# Patient Record
Sex: Male | Born: 1950
Health system: Southern US, Community
[De-identification: ages and names within clinical notes are randomized; demographics above are authoritative.]

## PROBLEM LIST (undated history)

## (undated) DIAGNOSIS — E785 Hyperlipidemia, unspecified: Secondary | ICD-10-CM

## (undated) DIAGNOSIS — Z95810 Presence of automatic (implantable) cardiac defibrillator: Secondary | ICD-10-CM

## (undated) DIAGNOSIS — I255 Ischemic cardiomyopathy: Secondary | ICD-10-CM

## (undated) DIAGNOSIS — I519 Heart disease, unspecified: Secondary | ICD-10-CM

## (undated) DIAGNOSIS — I2510 Atherosclerotic heart disease of native coronary artery without angina pectoris: Secondary | ICD-10-CM

## (undated) DIAGNOSIS — Z8614 Personal history of Methicillin resistant Staphylococcus aureus infection: Secondary | ICD-10-CM

## (undated) DIAGNOSIS — N529 Male erectile dysfunction, unspecified: Secondary | ICD-10-CM

## (undated) DIAGNOSIS — Z720 Tobacco use: Secondary | ICD-10-CM

## (undated) DIAGNOSIS — I252 Old myocardial infarction: Secondary | ICD-10-CM

## (undated) HISTORY — DX: Tobacco use: Z72.0

## (undated) HISTORY — DX: Male erectile dysfunction, unspecified: N52.9

## (undated) HISTORY — DX: Old myocardial infarction: I25.2

## (undated) HISTORY — DX: Ischemic cardiomyopathy: I25.5

## (undated) HISTORY — DX: Personal history of Methicillin resistant Staphylococcus aureus infection: Z86.14

## (undated) HISTORY — DX: Heart disease, unspecified: I51.9

## (undated) HISTORY — DX: Hyperlipidemia, unspecified: E78.5

## (undated) HISTORY — DX: Atherosclerotic heart disease of native coronary artery without angina pectoris: I25.10

## (undated) HISTORY — DX: Presence of automatic (implantable) cardiac defibrillator: Z95.810

---

## 2002-05-22 HISTORY — PX: CARDIAC CATHETERIZATION: SHX172

## 2005-10-21 ENCOUNTER — Emergency Department (HOSPITAL_COMMUNITY): Admission: EM | Admit: 2005-10-21 | Discharge: 2005-10-21 | Payer: Self-pay | Admitting: Emergency Medicine

## 2009-07-14 ENCOUNTER — Encounter: Admission: RE | Admit: 2009-07-14 | Discharge: 2009-07-14 | Payer: Self-pay | Admitting: Cardiology

## 2010-07-07 ENCOUNTER — Inpatient Hospital Stay (HOSPITAL_COMMUNITY)
Admission: EM | Admit: 2010-07-07 | Discharge: 2010-07-17 | DRG: 853 | Disposition: A | Discharge status: Home Health | Payer: BC Managed Care – PPO | Source: Ambulatory Visit | Attending: Cardiology | Admitting: Cardiology

## 2010-07-07 ENCOUNTER — Inpatient Hospital Stay (HOSPITAL_COMMUNITY): Payer: BC Managed Care – PPO

## 2010-07-07 DIAGNOSIS — I2510 Atherosclerotic heart disease of native coronary artery without angina pectoris: Secondary | ICD-10-CM | POA: Diagnosis present

## 2010-07-07 DIAGNOSIS — I9589 Other hypotension: Secondary | ICD-10-CM | POA: Diagnosis not present

## 2010-07-07 DIAGNOSIS — I10 Essential (primary) hypertension: Secondary | ICD-10-CM | POA: Diagnosis present

## 2010-07-07 DIAGNOSIS — Z9861 Coronary angioplasty status: Secondary | ICD-10-CM

## 2010-07-07 DIAGNOSIS — IMO0002 Reserved for concepts with insufficient information to code with codable children: Secondary | ICD-10-CM | POA: Diagnosis not present

## 2010-07-07 DIAGNOSIS — Y9210 Unspecified residential institution as the place of occurrence of the external cause: Secondary | ICD-10-CM | POA: Diagnosis not present

## 2010-07-07 DIAGNOSIS — I2109 ST elevation (STEMI) myocardial infarction involving other coronary artery of anterior wall: Secondary | ICD-10-CM

## 2010-07-07 DIAGNOSIS — Y849 Medical procedure, unspecified as the cause of abnormal reaction of the patient, or of later complication, without mention of misadventure at the time of the procedure: Secondary | ICD-10-CM | POA: Diagnosis not present

## 2010-07-07 DIAGNOSIS — I252 Old myocardial infarction: Secondary | ICD-10-CM

## 2010-07-07 DIAGNOSIS — Z7982 Long term (current) use of aspirin: Secondary | ICD-10-CM

## 2010-07-07 DIAGNOSIS — F17200 Nicotine dependence, unspecified, uncomplicated: Secondary | ICD-10-CM | POA: Diagnosis present

## 2010-07-07 DIAGNOSIS — A4902 Methicillin resistant Staphylococcus aureus infection, unspecified site: Secondary | ICD-10-CM | POA: Diagnosis not present

## 2010-07-07 DIAGNOSIS — I059 Rheumatic mitral valve disease, unspecified: Secondary | ICD-10-CM

## 2010-07-07 DIAGNOSIS — I519 Heart disease, unspecified: Secondary | ICD-10-CM

## 2010-07-07 DIAGNOSIS — E785 Hyperlipidemia, unspecified: Secondary | ICD-10-CM | POA: Diagnosis present

## 2010-07-07 HISTORY — DX: Heart disease, unspecified: I51.9

## 2010-07-07 HISTORY — PX: CARDIAC CATHETERIZATION: SHX172

## 2010-07-07 LAB — POCT I-STAT, CHEM 8
BUN: 18 mg/dL (ref 6–23)
Chloride: 100 mEq/L (ref 96–112)
Creatinine, Ser: 0.7 mg/dL (ref 0.4–1.5)
Potassium: 3.2 mEq/L — ABNORMAL LOW (ref 3.5–5.1)
Sodium: 131 mEq/L — ABNORMAL LOW (ref 135–145)

## 2010-07-07 LAB — CBC
MCH: 29.8 pg (ref 26.0–34.0)
MCHC: 35 g/dL (ref 30.0–36.0)
MCV: 85.1 fL (ref 78.0–100.0)
Platelets: 158 10*3/uL (ref 150–400)

## 2010-07-07 LAB — DIFFERENTIAL
Eosinophils Absolute: 0.1 10*3/uL (ref 0.0–0.7)
Eosinophils Relative: 1 % (ref 0–5)
Lymphocytes Relative: 12 % (ref 12–46)
Lymphs Abs: 1.1 10*3/uL (ref 0.7–4.0)
Monocytes Absolute: 0.4 10*3/uL (ref 0.1–1.0)
Monocytes Relative: 4 % (ref 3–12)
Neutro Abs: 7.6 10*3/uL (ref 1.7–7.7)

## 2010-07-07 LAB — COMPREHENSIVE METABOLIC PANEL
AST: 137 U/L — ABNORMAL HIGH (ref 0–37)
Albumin: 3.6 g/dL (ref 3.5–5.2)
Alkaline Phosphatase: 58 U/L (ref 39–117)
Calcium: 8.6 mg/dL (ref 8.4–10.5)
Creatinine, Ser: 0.96 mg/dL (ref 0.4–1.5)
GFR calc Af Amer: >60 mL/min (ref >60)
GFR calc non Af Amer: >60 mL/min (ref >60)
Total Bilirubin: 0.7 mg/dL (ref 0.3–1.2)
Total Protein: 6.4 g/dL (ref 6.0–8.3)

## 2010-07-07 LAB — POCT ACTIVATED CLOTTING TIME: Activated Clotting Time: 429 seconds

## 2010-07-07 LAB — TSH: TSH: 0.867 u[IU]/mL (ref 0.350–4.500)

## 2010-07-07 LAB — CARDIAC PANEL(CRET KIN+CKTOT+MB+TROPI)
CK, MB: 360.2 ng/mL (ref 0.3–4.0)
Relative Index: 12 — ABNORMAL HIGH (ref 0.0–2.5)
Total CK: 2813 U/L — ABNORMAL HIGH (ref 7–232)

## 2010-07-07 LAB — HEMOGLOBIN A1C
Hgb A1c MFr Bld: 5.8 % — ABNORMAL HIGH (ref <5.7)
Mean Plasma Glucose: 120 mg/dL — ABNORMAL HIGH (ref <117)

## 2010-07-07 LAB — BRAIN NATRIURETIC PEPTIDE: Pro B Natriuretic peptide (BNP): <30 pg/mL (ref 0.0–100.0)

## 2010-07-07 LAB — MAGNESIUM: Magnesium: 1.8 mg/dL (ref 1.5–2.5)

## 2010-07-08 DIAGNOSIS — I2109 ST elevation (STEMI) myocardial infarction involving other coronary artery of anterior wall: Secondary | ICD-10-CM

## 2010-07-08 LAB — LIPID PANEL
LDL Cholesterol: 103 mg/dL — ABNORMAL HIGH (ref 0–99)
Total CHOL/HDL Ratio: 4.9 RATIO
Triglycerides: 69 mg/dL (ref <150)
VLDL: 14 mg/dL (ref 0–40)

## 2010-07-08 LAB — BASIC METABOLIC PANEL
Calcium: 8.5 mg/dL (ref 8.4–10.5)
Chloride: 104 mEq/L (ref 96–112)

## 2010-07-08 LAB — CBC
HCT: 43 % (ref 39.0–52.0)
Hemoglobin: 14.7 g/dL (ref 13.0–17.0)
MCH: 29.2 pg (ref 26.0–34.0)
Platelets: 133 10*3/uL — ABNORMAL LOW (ref 150–400)
RDW: 13.3 % (ref 11.5–15.5)

## 2010-07-08 LAB — CARDIAC PANEL(CRET KIN+CKTOT+MB+TROPI)
Relative Index: 11.8 — ABNORMAL HIGH (ref 0.0–2.5)
Troponin I: 67.89 ng/mL (ref 0.00–0.06)

## 2010-07-09 LAB — CBC
HCT: 42.1 % (ref 39.0–52.0)
Hemoglobin: 14.5 g/dL (ref 13.0–17.0)
MCH: 29.3 pg (ref 26.0–34.0)
MCHC: 34.4 g/dL (ref 30.0–36.0)
MCV: 85.1 fL (ref 78.0–100.0)
RBC: 4.95 MIL/uL (ref 4.22–5.81)
RDW: 13.2 % (ref 11.5–15.5)
WBC: 9.7 10*3/uL (ref 4.0–10.5)

## 2010-07-09 LAB — BASIC METABOLIC PANEL
Calcium: 8.3 mg/dL — ABNORMAL LOW (ref 8.4–10.5)
Chloride: 104 mEq/L (ref 96–112)
GFR calc non Af Amer: >60 mL/min (ref >60)
Glucose, Bld: 111 mg/dL — ABNORMAL HIGH (ref 70–99)
Potassium: 4 mEq/L (ref 3.5–5.1)

## 2010-07-10 ENCOUNTER — Inpatient Hospital Stay (HOSPITAL_COMMUNITY): Payer: BC Managed Care – PPO

## 2010-07-11 LAB — CBC
HCT: 41.9 % (ref 39.0–52.0)
MCHC: 33.7 g/dL (ref 30.0–36.0)
Platelets: 132 10*3/uL — ABNORMAL LOW (ref 150–400)
RDW: 13.3 % (ref 11.5–15.5)
WBC: 7.4 10*3/uL (ref 4.0–10.5)

## 2010-07-11 LAB — URINE CULTURE
Colony Count: NO GROWTH
Culture  Setup Time: 201202190152

## 2010-07-11 LAB — BASIC METABOLIC PANEL
Calcium: 8.2 mg/dL — ABNORMAL LOW (ref 8.4–10.5)
GFR calc Af Amer: >60 mL/min (ref >60)
GFR calc non Af Amer: >60 mL/min (ref >60)
Glucose, Bld: 104 mg/dL — ABNORMAL HIGH (ref 70–99)
Potassium: 3.8 mEq/L (ref 3.5–5.1)
Sodium: 133 mEq/L — ABNORMAL LOW (ref 135–145)

## 2010-07-11 LAB — BRAIN NATRIURETIC PEPTIDE: Pro B Natriuretic peptide (BNP): 463 pg/mL — ABNORMAL HIGH (ref 0.0–100.0)

## 2010-07-12 LAB — CBC
HCT: 39.6 % (ref 39.0–52.0)
HCT: 40.9 % (ref 39.0–52.0)
Hemoglobin: 13.9 g/dL (ref 13.0–17.0)
MCH: 28.2 pg (ref 26.0–34.0)
MCH: 28.8 pg (ref 26.0–34.0)
MCHC: 34 g/dL (ref 30.0–36.0)
MCV: 82.7 fL (ref 78.0–100.0)
Platelets: 136 10*3/uL — ABNORMAL LOW (ref 150–400)
RDW: 12.9 % (ref 11.5–15.5)
RDW: 13 % (ref 11.5–15.5)

## 2010-07-12 LAB — BASIC METABOLIC PANEL
CO2: 27 mEq/L (ref 19–32)
Calcium: 8.5 mg/dL (ref 8.4–10.5)
Creatinine, Ser: 1.18 mg/dL (ref 0.4–1.5)
GFR calc Af Amer: >60 mL/min (ref >60)
GFR calc non Af Amer: >60 mL/min (ref >60)
Glucose, Bld: 96 mg/dL (ref 70–99)
Sodium: 135 mEq/L (ref 135–145)

## 2010-07-13 ENCOUNTER — Inpatient Hospital Stay (HOSPITAL_COMMUNITY): Payer: BC Managed Care – PPO

## 2010-07-13 LAB — BASIC METABOLIC PANEL
CO2: 26 mEq/L (ref 19–32)
Chloride: 100 mEq/L (ref 96–112)
GFR calc Af Amer: >60 mL/min (ref >60)
Glucose, Bld: 106 mg/dL — ABNORMAL HIGH (ref 70–99)
Potassium: 4.1 mEq/L (ref 3.5–5.1)
Sodium: 137 mEq/L (ref 135–145)

## 2010-07-13 LAB — CBC
HCT: 39.2 % (ref 39.0–52.0)
Hemoglobin: 13.3 g/dL (ref 13.0–17.0)
RBC: 4.7 MIL/uL (ref 4.22–5.81)

## 2010-07-14 LAB — BASIC METABOLIC PANEL
BUN: 18 mg/dL (ref 6–23)
CO2: 24 mEq/L (ref 19–32)
Calcium: 8.3 mg/dL — ABNORMAL LOW (ref 8.4–10.5)
Glucose, Bld: 91 mg/dL (ref 70–99)
Sodium: 135 mEq/L (ref 135–145)

## 2010-07-14 LAB — CBC
HCT: 37.8 % — ABNORMAL LOW (ref 39.0–52.0)
Hemoglobin: 13.1 g/dL (ref 13.0–17.0)
MCH: 29 pg (ref 26.0–34.0)
MCHC: 34.7 g/dL (ref 30.0–36.0)
MCV: 83.6 fL (ref 78.0–100.0)

## 2010-07-15 ENCOUNTER — Other Ambulatory Visit (HOSPITAL_COMMUNITY): Payer: BC Managed Care – PPO

## 2010-07-15 DIAGNOSIS — L0291 Cutaneous abscess, unspecified: Secondary | ICD-10-CM

## 2010-07-15 DIAGNOSIS — T827XXA Infection and inflammatory reaction due to other cardiac and vascular devices, implants and grafts, initial encounter: Secondary | ICD-10-CM

## 2010-07-15 DIAGNOSIS — L0390 Cellulitis, unspecified: Secondary | ICD-10-CM

## 2010-07-15 LAB — CBC
HCT: 38 % — ABNORMAL LOW (ref 39.0–52.0)
MCHC: 34.7 g/dL (ref 30.0–36.0)
MCV: 83.3 fL (ref 78.0–100.0)
RDW: 12.8 % (ref 11.5–15.5)

## 2010-07-15 LAB — BASIC METABOLIC PANEL
BUN: 16 mg/dL (ref 6–23)
Creatinine, Ser: 0.86 mg/dL (ref 0.4–1.5)
GFR calc non Af Amer: >60 mL/min (ref >60)
Glucose, Bld: 98 mg/dL (ref 70–99)
Potassium: 4.3 mEq/L (ref 3.5–5.1)

## 2010-07-15 LAB — WOUND CULTURE

## 2010-07-16 DIAGNOSIS — I808 Phlebitis and thrombophlebitis of other sites: Secondary | ICD-10-CM

## 2010-07-16 DIAGNOSIS — M2420 Disorder of ligament, unspecified site: Secondary | ICD-10-CM

## 2010-07-16 DIAGNOSIS — M629 Disorder of muscle, unspecified: Secondary | ICD-10-CM

## 2010-07-16 LAB — CULTURE, BLOOD (ROUTINE X 2)
Culture  Setup Time: 201202192139
Culture: NO GROWTH

## 2010-07-17 ENCOUNTER — Inpatient Hospital Stay (HOSPITAL_COMMUNITY): Payer: BC Managed Care – PPO

## 2010-07-17 LAB — BASIC METABOLIC PANEL
CO2: 27 mEq/L (ref 19–32)
Calcium: 8.3 mg/dL — ABNORMAL LOW (ref 8.4–10.5)
Creatinine, Ser: 0.97 mg/dL (ref 0.4–1.5)
Glucose, Bld: 105 mg/dL — ABNORMAL HIGH (ref 70–99)

## 2010-07-18 ENCOUNTER — Other Ambulatory Visit: Payer: Self-pay | Admitting: *Deleted

## 2010-07-18 LAB — CULTURE, BLOOD (ROUTINE X 2)
Culture  Setup Time: 201202210518
Culture: NO GROWTH

## 2010-07-18 NOTE — Consult Note (Signed)
NAME:  Spencer Horn, Spencer Horn                  ACCOUNT NO.:  [id number]  MEDICAL RECORD NO.:  [id number]           PATIENT TYPE:  I  LOCATION:  2020                         FACILITY:  [facility]  PHYSICIAN:  Dr. Imogene Burn               DATE OF BIRTH:  Aug 05, 1950  DATE OF CONSULTATION:  07/16/2010 DATE OF DISCHARGE:                                CONSULTATION   CHIEF COMPLAINT:  Cellulitis in the antecubital fossa with superficial thrombosis of left cephalic vein with open wound growing MRSA.  HISTORY OF PRESENT ILLNESS:  Spencer Horn is a 60 year old gentleman with a history of coronary artery disease, who was admitted on July 07, 2010 for acute anterior ST-elevation MI.  He developed cellulitis in the left arm in the antecubital space secondary to venipuncture.  He became febrile with a temperature of greater than 101 degrees Fahrenheit.  He had blood cultures and urine cultures, which were negative.  Culture of this small wound showed few MRSA and he was started on IV vancomycin. The patient states that since he has been on the antibiotics, his cellulitis which is on the anterior aspect of the antecubital fossa from just below the elbow and just above the elbow has much improved, and there is now only slight redness at the area of the wound.  He denies pain in the left upper extremity except for at the site of the wound.  PAST MEDICAL HISTORY: 1. Coronary artery disease, status post stent. 2. Hypertension. 3. Hypercholesterolemia. 4. Positive smoker.  Medications, social history, family history, and review of systems were reviewed and they are unchanged from the admission history and physical.  LAB VALUES:  Blood cultures were negative.  Urine cultures negative. Positive MRSA from the left antecubital wound.  He also had a venous Doppler of the left upper extremity, which showed superficial thrombosis of the left cephalic vein extending proximally 1 inch above the antecubital space and wound.   Otherwise, there is no generalized swelling or cellulitis in the arm.  PHYSICAL EXAM:  GENERAL:  This is a well-developed, well-nourished gentleman, in no acute distress.  He is alert and oriented x3. VITAL SIGNS:  His temperature was 98-99, his blood pressure is 97/64, his sats are 97 on room air. LEFT UPPER EXTREMITY:  Warm and well perfused.  He has positive brachial and radial pulses palpable.  He has no generalized swelling in the hand or arm.  He has approximately 1 cm round area of erythema and a 2-mm open superficial wound in the antecubital fossa at the left cephalic vein, and there is a firm area representing thrombosis in the cephalic vein approximately 3 cm above the wound.  Again, there is no generalized cellulitis, swelling, or drainage from the wound.  ASSESSMENT:  Superficial thrombophlebitis, left cephalic vein with open wound growing methicillin-resistant Staphylococcus aureus.  PLAN:  To continue the IV vancomycin and change to p.o. antibiotics per Medicine Service.  Continue warm compresses to the left arm.  As the cellulitis has been improving with the antibiotics, there is no need for I and  D of this area at this time.     Della Goo, PA-C   ______________________________ Dr. Imogene Burn    RR/MEDQ  D:  07/16/2010  T:  07/16/2010  Job:  [id number]  Electronically Signed by Della Goo PA on 07/18/2010 01:07:51 PM

## 2010-07-20 NOTE — Procedures (Signed)
Spencer Horn, Spencer Horn                  ACCOUNT NO.:  [id number]  MEDICAL RECORD NO.:  [id number]           PATIENT TYPE:  I  LOCATION:  2912                         FACILITY:  [facility]  PHYSICIAN:  Peter M. Swaziland, M.D.  DATE OF BIRTH:  March 30, 1951  DATE OF PROCEDURE:  07/07/2010 DATE OF DISCHARGE:                           CARDIAC CATHETERIZATION   INDICATIONS FOR PROCEDURE:  A 60 year old white male with remote myocardial infarction in 2004 while residing in [place].  He had stenting of the proximal LAD at that time.  It is unknown whether this was a bare-metal stent or drug-eluting stent.  He presents this morning with acute onset of severe substernal chest pain.  He has marked ST elevation diffusely in anterolateral leads up to 5-6 mm.  PROCEDURE:  Left heart catheterization, coronary and left ventricular angiography, intracoronary stenting of the proximal LAD.  ACCESS:  Via the right femoral artery using standard Seldinger technique. EQUIPMENT:  A 6-French 4-cm right and left Judkins catheter, 6-French pigtail catheter, 6-French arterial sheath, 6-French FL-4 guiding, Prowater wire Expressway catheter, 2.5 x 20-mm apex balloon, a 3.0 x 38 mm ION stent, and a 3.25 x 20-mm [place] TREK balloon.  MEDICATIONS:  Local anesthesia 1% Xylocaine, Versed 1 mg IV, fentanyl 50 mcg IV, Angiomax bolus of 0.75 mg/kg followed by continuous infusion of 1.75 mg/kg per hour.  Subsequent ACT was 429.  He received 60 mg of p.o. Effient.  Nitroglycerin 200 mcg intracoronary x2.  CONTRAST:  165 mL of Omnipaque.  HEMODYNAMIC DATA:  Aortic pressure is 94/66 with a mean of 80 mmHg. Left ventricular pressure is 100 with an EDP of 28 mmHg.  ANGIOGRAPHIC DATA:  The left coronary artery arises and distributes normally.  The left main coronary artery is normal.  The left anterior descending artery is occluded proximally.  There is a stent noted in the proximal to mid LAD.  The left circumflex coronary artery  has scattered 30% irregularities throughout the midvessel.  The right coronary artery arises and distributes normally.  It has a 20- 30% focal disease in the proximal to mid vessel.  At this point, we proceeded with emergent intervention of the LAD.  We were able to cross the LAD easily with a Prowater wire.  We performed Expressway catheter extraction with some white thrombus removed.  This resulted in reperfusion.  We then predilated the long segment of disease in the LAD extending from the very proximal vessel through the prior stent.  This was predilated with 2.5 mm balloon up to 6 atmospheres.  We then placed a 3.0 x 38 mm ION stent in the proximal LAD deploying this at 11 atmospheres.  The distal portion of the stent was within the old stent.  This was postdilated with a 3.25-mm Misenheimer TREK balloon to 14 atmospheres.  This resulted in excellent angiographic result with 0% residual stenosis and TIMI grade 3 flow.  There was still sluggish flow down the first diagonal.  The patient had marked improvement in his chest pain ST elevation with reperfusion.  He did experience some reperfusion arrhythmias with accelerated idioventricular rhythms.  Left  ventricular angiography was performed at the end of the procedure. This demonstrates extensive anterolateral and apical akinesia was ejection fraction overall 30%.  The right groin was closed using Angio-Seal device with good hemostasis.  FINAL INTERPRETATION: 1. Single-vessel occlusive atherosclerotic coronary artery disease     involving the proximal left anterior descending. 2. Severe left ventricular dysfunction. 3. Successful stenting of the proximal left anterior descending with a     drug-eluting stent.  PLAN:  We will transfer to the Coronary Intensive Care Unit and continue on aspirin and Effient indefinitely.  He will be treated with aggressive medication including beta-blockers, ACE inhibitors, and statins.           ______________________________ Peter M. Swaziland, M.D.     PMJ/MEDQ  D:  07/07/2010  T:  07/08/2010  Job:  [id number]  cc:   Cassell Clement, M.D.  Electronically Signed by PETER Swaziland M.D. on 07/20/2010 04:51:55 PM

## 2010-07-28 ENCOUNTER — Ambulatory Visit (INDEPENDENT_AMBULATORY_CARE_PROVIDER_SITE_OTHER): Payer: BC Managed Care – PPO | Admitting: Cardiology

## 2010-07-28 DIAGNOSIS — I219 Acute myocardial infarction, unspecified: Secondary | ICD-10-CM

## 2010-07-28 DIAGNOSIS — Z79899 Other long term (current) drug therapy: Secondary | ICD-10-CM

## 2010-07-29 NOTE — H&P (Signed)
NAMESTPEHEN, PETITJEAN                  ACCOUNT NO.:  [id number]  MEDICAL RECORD NO.:  [id number]           PATIENT TYPE:  I  LOCATION:  2912                         FACILITY:  [facility]  PHYSICIAN:  Peter M. Swaziland, M.D.  DATE OF BIRTH:  1950/12/26  DATE OF ADMISSION:  07/07/2010 DATE OF DISCHARGE:                             HISTORY & PHYSICAL   PRIMARY CARE PHYSICIAN:  Windle Guard, MD  PRIMARY CARDIOLOGIST:  Cassell Clement, MD  CHIEF COMPLAINT:  STEMI.  HISTORY OF PRESENT ILLNESS:  [name] is a 60 year old male with a history of coronary artery disease.  He was driving and at about 0:45 a.m. today had onset of substernal chest pain associated with nausea, vomiting x2.  At 9:23, EMS was called.  They gave him aspirin 81 mg x4 as well as sublingual nitroglycerin x3 and morphine totaling 8 mg.  His EKG was abnormal indicating an anterior ST-elevation MI.  His chest pain was a 10/10 and crushing.  It was associated with shortness of breath and diaphoresis.  He had some general malaise last p.m. but no specific complaints of chest pain until the sudden onset today while driving. His symptoms are similar to the symptoms he had during his previous MI x2 but no further details are available.  He is being taken emergently to the cath lab.  PAST MEDICAL HISTORY: 1. Status post MI with stent, 2004, no further details available. 2. Status post Myoview in February 2011 showing no scar or ischemia,     EF 66%. 3. Hypertension. 4. Hyperlipidemia. 5. Ongoing tobacco use.  PAST SURGICAL HISTORY:  Cath.  ALLERGIES:  No known drug allergies  CURRENT MEDICATIONS: 1. Aspirin 81 mg a day. 2. Toprol-XL 100 mg a day. 3. Lipitor 80 mg one-half tab daily. 4. The patient is out of Plavix.  SOCIAL HISTORY:  He lives in [place] with his wife.  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  He has a greater than 30-pack-year history of ongoing tobacco use but has cut back.  There is no  known history of alcohol or drug abuse.  FAMILY HISTORY:  Not obtainable secondary to the emergent situation.  REVIEW OF SYSTEMS:  Essentially negative except as stated in the HPI.  PHYSICAL EXAMINATION:  GENERAL:  He is a well-developed, slender white male, in acute distress. HEENT:  Normal. NECK:  There is no lymphadenopathy, thyromegaly, bruit, or JVD noted. CARDIOVASCULAR:  His heart is regular in rate and rhythm with an S1 and S2 and no significant murmur, rub, or gallop is noted.  Distal pulses are intact in all four extremities. LUNGS:  Clear to auscultation bilaterally. SKIN:  No rashes or lesions are noted. ABDOMEN:  Soft and nontender with active bowel sounds. EXTREMITIES:  There is no cyanosis, clubbing, or edema noted.  No femoral bruits are appreciated. MUSCULOSKELETAL:  There is no joint deformity or effusions. NEUROLOGIC:  He is alert and oriented.  Cranial nerves II through XII grossly intact.  EKG is sinus rhythm, rate 78 with acute anterior ST-elevation of greater than 2 mm. Laboratory values and chest x-Arpin are  pending.  IMPRESSION:  Acute anterior ST-elevation myocardial infarction:  Dr. Swaziland is taking the patient emergently to the cath lab with further evaluation and treatment depending on the results.  He will be seen by cardiac rehab and smoking cessation, and we will screen for cardiac risk factors.     Spencer Demark, PA-C   ______________________________ Peter M. Swaziland, M.D.    RB/MEDQ  D:  07/07/2010  T:  07/07/2010  Job:  [id number]  Electronically Signed by Spencer Demark PA-C on 07/25/2010 06:58:31 AM Electronically Signed by PETER Swaziland M.D. on 07/29/2010 11:16:29 AM

## 2010-08-10 NOTE — Discharge Summary (Signed)
Spencer Horn, Spencer Horn                  ACCOUNT NO.:  [id number]  MEDICAL RECORD NO.:  [id number]           PATIENT TYPE:  I  LOCATION:  2020                         FACILITY:  [facility]  PHYSICIAN:  Gerrit Friends. Dietrich Pates, MD, FACCDATE OF BIRTH:  Dec 14, 1950  DATE OF ADMISSION:  07/07/2010 DATE OF DISCHARGE:  07/17/2010                              DISCHARGE SUMMARY   PRIMARY CARDIOLOGIST:  Cassell Clement, MD  PRIMARY CARE PROVIDER:  Windle Guard, MD  DISCHARGE DIAGNOSIS:  Acute anterolateral ST-segment elevation myocardial infarction.  SECONDARY DIAGNOSES: 1. Coronary disease status post prior left anterior descending     stenting with proximal left anterior descending stenting on this     admission. 2. Ischemic cardiomyopathy with an EF of 30-35% by echo this     admission. 3. Hypotension limiting ability to titrate beta-blocker and ACE     inhibitor this admission. 4. Hyperlipidemia. 5. Ongoing tobacco abuse. 6. Methicillin-resistant Staphylococcus aureus septic thrombophlebitis     to be discharged on IV vancomycin. 7. Left cephalic vein, superficial thrombosis without deep vein     thrombosis.  ALLERGIES:  No known drug allergies.  PROCEDURES: 1. Emergent left heart cardiac catheterization revealing total     occlusion of the proximal LAD with otherwise nonobstructive     disease.  The LAD was successfully stented with placement of a 3.0     x 38 mm ion drug-eluting stent. 2. A 2-D echocardiogram performed on July 07, 2010, showing an EF     30-35% with akinesis of the entire apical myocardium as well as the     mid-to-distal anteroseptal myocardium.  Grade 1 diastolic     dysfunction.  Trivial AI.  Mild mitral regurgitation.  PASP 44     mmHg. 3. Left upper extremity ultrasound showing superficial thrombosis of     the left cephalic vein extending approximately 1 inch above the     antecubital space.  No DVT noted.  HISTORY OF PRESENT ILLNESS:  A 60 year old male  with prior history of CAD status post prior LAD stenting in 2004.  He was in his usual state of health until approximately 9 a.m. on July 07, 2010, he began sudden onset of chest pain associated with nausea and vomiting x2.  EMS was called and the patient was treated with baby aspirin, sublingual nitroglycerin, as well as morphine.  ECG showed anterolateral ST-segment elevation.  Code STEMI was called and the patient was taken emergently to [facility].  HOSPITAL COURSE:  The patient underwent emergent diagnostic catheterization revealing total occlusion of proximal LAD with otherwise nonobstructive disease.  The LAD was successfully intervened upon with placement of 3.0 x 38 mm ion drug-eluting stent.  Left ventriculography was performed showing an EF of 30% with anterolateral and apical akinesis.  Postprocedure, the patient was monitored in the Coronary Intensive Care Unit.  He was maintained on aspirin, Effient, statin, and beta-blocker therapy with reduction in EF noted on ventriculography.  A 2-D echocardiogram was performed on July 07, 2010, showing an EF of 30- 35% with anteroseptal and apical akinesis.  Unfortunately, the patient  was initially hypotensive requiring downward titration of his beta- blocker and preventing initiation of ACE inhibitor therapy.  His blood pressures, although remaining soft, has improved some with a general trend in the low 100s allowing for initiation of low-dose ACE inhibitor along with low-dose spirolactone therapy.  The patient has had no issues with volume overload during this admission.  He has been seen by Cardiac Rehab as well as having been counseled on the importance of smoking cessation.  On July 10, 2010, Mr. Hanners was noted to exhibit a low-grade fever. He did not have any significant elevation in his white blood cell count. Urinalysis and culture were unrevealing while his chest x-Grunwald showed no evidence of infection.   He was subsequently noted to have significant erythema and swelling at a previous IV site in the left antecubital area.  This was felt to be the source of his fever and the patient was placed on vancomycin therapy for treatment of cellulitis.  A wound culture was performed and blood cultures were sent off.  Blood cultures have not shown any growth.  However, wound culture did show MRSA.  With this information, Infectious Disease was consulted and recommended continuation of vancomycin therapy with ultrasound of the left antecubital space for evaluation of abscess or clot.  Ultrasound showed superficial thrombosis of the left cephalic vein extending approximately 1 inch above the antecubital space.  However, no DVT or abscess was noted.  Vascular Surgery was consulted and did not feel that there is any advantage to surgical excision of thrombus and recommended continued antibiotic therapy with warm compresses.  Infectious disease has subsequently recommended continuation of IV vancomycin for an additional 12 days, to be followed by doxycycline 1 mg b.i.d. orally for 2 weeks.  We have worked with case management to arrange for home health and IV antibiotics.  The patient has a PICC line in place, which will be discontinued only once he has completed his IV antibiotic course.  Infectious disease has requested that vancomycin be dosed for a trough of 15-20 and labs will be sent to the Infectious Disease office for dosing adjustments.  In the setting of acute coronary syndrome with anterior MI and large anterior septal and apical wall motion abnormalities, an EF of 30-35%, we have arranged for the patient to be discharged with a LifeVest.  The patient will require a follow-up 2-D echocardiogram in the next 8-12 weeks to reassess LV function on medical therapy following revascularization.  If EF remains below 35%, EP referral will be made for ICD placement.  DISCHARGE LABORATORY DATA:   Hemoglobin 13.2, hematocrit 38.0, WBC 6.6, and platelets 168.  Sodium 135, potassium 4.3, chloride 104, CO2 of 27, BUN 116, creatinine 0.97, glucose 105, total bilirubin 0.7, alkaline phosphatase 58, AST 137, ALT 26, total protein 6.4, albumin 3.6, calcium 8.3, magnesium 1.8, and hemoglobin A1c 5.8.  Peak CK 2813, peak MB 360.2, peak troponin-I 92.46.  Total cholesterol 147, triglycerides 69, HDL 30, LDL 103, and TSH 0.867.  Vancomycin trough on July 15, 2010 was 9.6.  His wound culture grew out MRSA, which was sensitive to clindamycin, gentamicin, rifampin, trimethoprim, sulfamethoxazole, vancomycin, and tetracycline.  He had has had 4 sets of blood cultures all of which have shown no growth.  Urine culture showed no growth. MRSA screen was negative.  DISPOSITION:  The patient will be discharged home today in good condition.  FOLLOWUP APPOINTMENTS:  We will arrange follow up for the patient with Dr. Cassell Clement in approximately  2 weeks.  The patient will have weekly CBCs and basic metabolic panels through home health.  Follow up Dr. Jeannetta Nap as scheduled and with infectious disease only as needed.  DISCHARGE MEDICATIONS: 1. Carvedilol 3.125 mg b.i.d. 2. Doxycycline 100 mg b.i.d. to be initiated once a 12-day course of     vancomycin completed. 3. Lipitor 80 mg at bedtime. 4. Lisinopril 2.5 mg daily. 5. Nicotine patch 21 mg daily x6 weeks, 14 mg daily x2 weeks, and 7 mg     daily x2 weeks. 6. Nitroglycerin 0.4 mg sublingual chest pain. 7. Prasugrel 10 mg daily. 8. Spirolactone 25 mg half a tablet daily. 9. Vancomycin 1000 mg q.12 h. and then as directed based on trough     with goal of 15-20 x12 more days. 10.Aspirin 81 mg daily. 11.Multivitamin daily.  OUTSTANDING LABORATORY STUDIES:  Weekly CBC, BMET, and vancomycin levels.  To be followed by Dr. Daiva Eves with Infectious Disease.  DURATION DISCHARGE ENCOUNTER:  90 minutes including physician time.     Nicolasa Ducking, ANP   ______________________________ Gerrit Friends. Dietrich Pates, MD, [facility]    CB/MEDQ  D:  07/17/2010  T:  07/17/2010  Job:  [id number]  cc:   Windle Guard, M.D.  Electronically Signed by Nicolasa Ducking ANP on 07/26/2010 04:14:38 PM Electronically Signed by [place] Bing MD [facility] on 08/10/2010 06:41:27 PM

## 2010-08-10 NOTE — Discharge Summary (Signed)
  NAMEJAYRO, Spencer Horn                  ACCOUNT NO.:  [id number]  MEDICAL RECORD NO.:  [id number]           PATIENT TYPE:  I  LOCATION:  2020                         FACILITY:  [facility]  PHYSICIAN:  Gerrit Friends. Dietrich Pates, MD, FACCDATE OF BIRTH:  [birth year]  DATE OF ADMISSION:  07/07/2010 DATE OF DISCHARGE:  07/17/2010                              DISCHARGE SUMMARY   ADDENDUM  Prior to discharge, the patient's vancomycin dose has been adjusted to 1250 mg IV b.i.d. and then as directed to maintain a trough with goal of 15-20, x12 more days.     Nicolasa Ducking, ANP   ______________________________ Gerrit Friends. Dietrich Pates, MD, [facility]    CB/MEDQ  D:  07/17/2010  T:  07/18/2010  Job:  [id number]  Electronically Signed by Nicolasa Ducking ANP on 07/26/2010 04:20:14 PM Electronically Signed by [place] Bing MD [name] on 08/10/2010 06:41:32 PM

## 2010-08-11 ENCOUNTER — Telehealth: Payer: Self-pay | Admitting: Cardiology

## 2010-08-11 NOTE — Telephone Encounter (Signed)
Wife phoned requesting samples of any medications we may have.  Advised only had Effient.

## 2010-08-11 NOTE — Telephone Encounter (Signed)
Spoke with patients wife and only have samples of Effient,

## 2010-08-11 NOTE — Telephone Encounter (Signed)
Patient wife called requesting samples of all medications.  Patient is out of work.  Out of Effient/generic for Prasugel.

## 2010-09-05 ENCOUNTER — Other Ambulatory Visit: Payer: Self-pay | Admitting: *Deleted

## 2010-09-05 DIAGNOSIS — E7800 Pure hypercholesterolemia, unspecified: Secondary | ICD-10-CM

## 2010-09-07 ENCOUNTER — Encounter: Payer: Self-pay | Admitting: *Deleted

## 2010-09-07 DIAGNOSIS — I2510 Atherosclerotic heart disease of native coronary artery without angina pectoris: Secondary | ICD-10-CM

## 2010-09-07 DIAGNOSIS — E785 Hyperlipidemia, unspecified: Secondary | ICD-10-CM

## 2010-09-07 DIAGNOSIS — I255 Ischemic cardiomyopathy: Secondary | ICD-10-CM

## 2010-09-07 DIAGNOSIS — I519 Heart disease, unspecified: Secondary | ICD-10-CM

## 2010-09-07 DIAGNOSIS — I10 Essential (primary) hypertension: Secondary | ICD-10-CM | POA: Insufficient documentation

## 2010-09-07 DIAGNOSIS — I219 Acute myocardial infarction, unspecified: Secondary | ICD-10-CM

## 2010-09-08 ENCOUNTER — Encounter: Payer: Self-pay | Admitting: Cardiology

## 2010-09-08 ENCOUNTER — Other Ambulatory Visit (INDEPENDENT_AMBULATORY_CARE_PROVIDER_SITE_OTHER): Payer: BC Managed Care – PPO | Admitting: *Deleted

## 2010-09-08 ENCOUNTER — Ambulatory Visit (INDEPENDENT_AMBULATORY_CARE_PROVIDER_SITE_OTHER): Payer: BC Managed Care – PPO | Admitting: Cardiology

## 2010-09-08 VITALS — BP 106/80 | HR 80 | Wt 164.0 lb

## 2010-09-08 DIAGNOSIS — I2510 Atherosclerotic heart disease of native coronary artery without angina pectoris: Secondary | ICD-10-CM

## 2010-09-08 DIAGNOSIS — E7800 Pure hypercholesterolemia, unspecified: Secondary | ICD-10-CM

## 2010-09-08 DIAGNOSIS — I252 Old myocardial infarction: Secondary | ICD-10-CM

## 2010-09-08 DIAGNOSIS — I519 Heart disease, unspecified: Secondary | ICD-10-CM

## 2010-09-08 LAB — BASIC METABOLIC PANEL
CO2: 28 mEq/L (ref 19–32)
Chloride: 103 mEq/L (ref 96–112)
Sodium: 140 mEq/L (ref 135–145)

## 2010-09-08 LAB — HEPATIC FUNCTION PANEL
ALT: 20 U/L (ref 0–53)
AST: 24 U/L (ref 0–37)
Albumin: 3.9 g/dL (ref 3.5–5.2)
Alkaline Phosphatase: 73 U/L (ref 39–117)
Total Protein: 7.3 g/dL (ref 6.0–8.3)

## 2010-09-08 LAB — LIPID PANEL
Cholesterol: 112 mg/dL (ref 0–200)
VLDL: 15.8 mg/dL (ref 0.0–40.0)

## 2010-09-09 NOTE — Assessment & Plan Note (Signed)
His ejection fraction shortly after his heart attack was 30-35%.  A repeat echocardiogram will be obtained in mid May to assess the current LV function.  The patient does have exertional dyspnea and easy fatigue.

## 2010-09-09 NOTE — Progress Notes (Signed)
Spencer Horn Date of Birth:  1951/01/15 [name] / [name] [address]   [address] [name], [name]  [address] [phone]           Fax   [phone]  History of Present Illness: This 60 year old gentleman has known ischemic heart disease.  He previously lived in [place] where he had had a heart attack in 2004.  When he moved to [facility] where he saw him for the first time in did a subsequent treadmill Cardiolite in February 2011 which showed no ischemia and his ejection fraction at that time was 66%.  The patient was lost to followup until the morning of 07/07/10 where he had a code steady with total occlusion of his proximal LAD with otherwise nonobstructive disease and underwent successful stenting with a drug-eluting stent by Dr. Swaziland.  His echocardiogram on July 07, 2010 showed an ejection fraction of 30-35% with akinesis of the entire apical myocardium as well as mid to distal anteroseptal myocardium his hospital course was complicated by low cardiac output and low blood pressure.  Just prior to discharge we were able to have a small dose of an ACE inhibitor.  He was sent home with a life vest which she is still wearing.  He has not had any shocks from his life vest device.  3 months after his myocardial infarction we will repeat his echocardiogram to determine whether he should have an ICD implanted.  Current Outpatient Prescriptions  Medication Sig Dispense Refill  . aspirin 81 MG tablet Take 81 mg by mouth daily.        [name] atorvastatin (LIPITOR) 80 MG tablet Take 80 mg by mouth daily.        . carvedilol (COREG) 3.125 MG tablet Take 3.125 mg by mouth 2 (two) times daily with a meal.        . lisinopril (PRINIVIL,ZESTRIL) 2.5 MG tablet Take 2.5 mg by mouth daily.        . nicotine (NICODERM CQ - DOSED IN MG/24 HOURS) 21 mg/24hr patch Place 1 patch onto the skin daily.        . nitroGLYCERIN (NITROSTAT) 0.4 MG SL tablet Place 0.4 mg under the tongue every 5  (five) minutes as needed.        . prasugrel (EFFIENT) 10 MG TABS Take 10 mg by mouth daily.        [name] spironolactone (ALDACTONE) 25 MG tablet Take 12.5 mg by mouth daily. 1/2 tablet daily       . therapeutic multivitamin-minerals (THERAGRAN-M) tablet Take 1 tablet by mouth daily.          No Known Allergies  Patient Active Problem List  Diagnoses  . Coronary artery disease  . Hyperlipidemia  . Ventricular dysfunction  . Dyslipidemia  . Hypertension  . Ischemic cardiomyopathy    History  Smoking status  . Former Smoker  . Types: Cigarettes  Smokeless tobacco  . Not on file    History  Alcohol Use: Not on file    No family history on file.  Review of Systems: Constitutional: no fever chills diaphoresis or fatigue or change in weight.  Head and neck: no hearing loss, no epistaxis, no photophobia or visual disturbance. Respiratory: No cough, shortness of breath or wheezing. Cardiovascular: No chest pain peripheral edema, palpitations. Gastrointestinal: No abdominal distention, no abdominal pain, no change in bowel habits hematochezia or melena. Genitourinary: No dysuria, no frequency, no urgency, no nocturia. Musculoskeletal:No arthralgias, no back pain, no gait  disturbance or myalgias. Neurological: No dizziness, no headaches, no numbness, no seizures, no syncope, no weakness, no tremors. Hematologic: No lymphadenopathy, no easy bruising. Psychiatric: No confusion, no hallucinations, no sleep disturbance.    Physical Exam: Filed Vitals:   09/08/10 0841  BP: 106/80  Pulse: 80  Weight is 164, up 8 pounds.  The general appearance reveals a well-developed well-nourished gentleman in no distress.Pupils equal and reactive.   Extraocular Movements are full.  There is no scleral icterus.  The mouth and pharynx are normal.  The neck is supple.  The carotids reveal no bruits.  The jugular venous pressure is normal.  The thyroid is not enlarged.  There is no lymphadenopathy.The  chest is clear to percussion and auscultation. There are no rales or rhonchi. Expansion of the chest is symmetrical.The precordium is quiet.  The first heart sound is normal.  The second heart sound is physiologically split.  There is no murmur gallop rub or click.  There is no abnormal lift or heave.The abdomen is soft and nontender. Bowel sounds are normal. The liver and spleen are not enlarged. There Are no abdominal masses. There are no bruits.The pedal pulses are good.  There is no phlebitis or edema.  There is no cyanosis or clubbing.Strength is normal and symmetrical in all extremities.  There is no lateralizing weakness.  There are no sensory deficits.The skin is warm and dry.  There is no rash.   Assessment / Plan: His electrocardiogram today interpreted by me shows normal sinus rhythm, old anteroseptal infarct, and T-wave abnormalities in the anteroseptal and anterolateral leads which have improved since the previous EKG of 07/28/10. The patient will return in several weeks for a two-dimensional echocardiogram.  We're filling out additional disability papers for him today.  He'll return for routine followup visit in several months.Continue same medication.

## 2010-09-09 NOTE — Assessment & Plan Note (Signed)
This middle-aged gentleman has known ischemic heart disease.  Had a heart attack while living in [place] in 2004.  In February of 2011 he had a treadmill Cardiolite stress test which showed no ischemia.  On 07/07/10 he had a sudden acute anterolateral ST segment elevation myocardial infarction.  He underwent coated stem he and had a sessile treatment of a total occlusion of his proximal LAD which was stented with a drug-eluting stent.  His echocardiogram July 07, 2010 showed an ejection fraction of 30-35% with akinesis of the entire apical myocardium.  He was sent home with a life vest.  He has had no shocks from the device.  Since going home he has had some occasional chest tightness.  He has not tried sublingual nitroglycerin.  He also notes that at times his left arm feels slightly has been limiting his activity.  Our plan is to get an echocardiogram 3 months after his infarct and that his left ventricular ejection fraction is still low he will need an ICD implant.  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

## 2010-09-13 ENCOUNTER — Telehealth: Payer: Self-pay | Admitting: *Deleted

## 2010-09-13 NOTE — Telephone Encounter (Signed)
Message copied by Regis Bill on Tue Sep 13, 2010 11:30 AM ------      Message from: Cassell Clement      Created: Fri Sep 09, 2010  7:11 AM       Blood work is satisfactory.  Continue same medication.

## 2010-09-13 NOTE — Progress Notes (Signed)
Advised wife of labs 

## 2010-09-13 NOTE — Telephone Encounter (Signed)
Advised wife of labs 

## 2010-09-22 ENCOUNTER — Telehealth: Payer: Self-pay | Admitting: *Deleted

## 2010-09-22 NOTE — Telephone Encounter (Signed)
Encounter opened in error

## 2010-09-26 ENCOUNTER — Other Ambulatory Visit: Payer: Self-pay | Admitting: *Deleted

## 2010-09-26 DIAGNOSIS — I519 Heart disease, unspecified: Secondary | ICD-10-CM

## 2010-10-03 ENCOUNTER — Encounter: Payer: Self-pay | Admitting: Infectious Disease

## 2010-10-06 ENCOUNTER — Ambulatory Visit (HOSPITAL_COMMUNITY): Payer: BC Managed Care – PPO | Attending: Cardiology | Admitting: Radiology

## 2010-10-06 DIAGNOSIS — I519 Heart disease, unspecified: Secondary | ICD-10-CM

## 2010-10-06 DIAGNOSIS — I080 Rheumatic disorders of both mitral and aortic valves: Secondary | ICD-10-CM | POA: Insufficient documentation

## 2010-10-06 DIAGNOSIS — I2510 Atherosclerotic heart disease of native coronary artery without angina pectoris: Secondary | ICD-10-CM

## 2010-10-06 DIAGNOSIS — E785 Hyperlipidemia, unspecified: Secondary | ICD-10-CM | POA: Insufficient documentation

## 2010-10-06 DIAGNOSIS — I10 Essential (primary) hypertension: Secondary | ICD-10-CM | POA: Insufficient documentation

## 2010-10-06 DIAGNOSIS — I252 Old myocardial infarction: Secondary | ICD-10-CM | POA: Insufficient documentation

## 2010-10-11 NOTE — Progress Notes (Signed)
Sent message to [facility] lanier at Dr. Bruna Potter office to see about getting appointment before July.  Was told when I called that was 1st available.

## 2010-10-19 ENCOUNTER — Encounter: Payer: Self-pay | Admitting: Cardiology

## 2010-10-20 ENCOUNTER — Telehealth: Payer: Self-pay | Admitting: Internal Medicine

## 2010-10-20 NOTE — Telephone Encounter (Signed)
Will have our office call to schedule an appointment with Dr Ladona Ridgel to discuss ICD implant

## 2010-10-20 NOTE — Telephone Encounter (Signed)
icd replacement was recommended by dr [name], left message with kelly to either call the pt or their office and neither one has heard-requesting that you call the pt

## 2010-10-21 DIAGNOSIS — Z95810 Presence of automatic (implantable) cardiac defibrillator: Secondary | ICD-10-CM

## 2010-10-21 HISTORY — DX: Presence of automatic (implantable) cardiac defibrillator: Z95.810

## 2010-10-21 HISTORY — PX: EP IMPLANTABLE DEVICE: SHX172B

## 2010-10-24 ENCOUNTER — Telehealth: Payer: Self-pay | Admitting: Internal Medicine

## 2010-10-24 NOTE — Telephone Encounter (Signed)
Will send to Dr Yevonne Pax nurse Juliette Alcide

## 2010-10-24 NOTE — Telephone Encounter (Signed)
Samples gathered for patient 

## 2010-10-28 ENCOUNTER — Ambulatory Visit (INDEPENDENT_AMBULATORY_CARE_PROVIDER_SITE_OTHER): Payer: BC Managed Care – PPO | Admitting: Internal Medicine

## 2010-10-28 ENCOUNTER — Encounter: Payer: Self-pay | Admitting: Internal Medicine

## 2010-10-28 ENCOUNTER — Encounter: Payer: Self-pay | Admitting: *Deleted

## 2010-10-28 DIAGNOSIS — I255 Ischemic cardiomyopathy: Secondary | ICD-10-CM

## 2010-10-28 DIAGNOSIS — I5022 Chronic systolic (congestive) heart failure: Secondary | ICD-10-CM

## 2010-10-28 DIAGNOSIS — I2589 Other forms of chronic ischemic heart disease: Secondary | ICD-10-CM

## 2010-10-28 NOTE — Patient Instructions (Signed)

## 2010-10-30 ENCOUNTER — Encounter: Payer: Self-pay | Admitting: Internal Medicine

## 2010-10-30 DIAGNOSIS — I5022 Chronic systolic (congestive) heart failure: Secondary | ICD-10-CM | POA: Insufficient documentation

## 2010-10-30 NOTE — Assessment & Plan Note (Signed)
He is a candidate for ICD implant with his persistent LV dysfunction EF 30-35%, class 2-3 CHF despite maximal medical therapy. I have discussed the risks/benefits/goals/and expectations of ICD implant and he wishes to proceed.

## 2010-10-30 NOTE — Assessment & Plan Note (Signed)
He denies anginal symptoms. He will continue his current meds and maintain a low sodium diet.

## 2010-10-30 NOTE — Progress Notes (Signed)
HPI Spencer Horn is referred today by Dr. [name] for consideration for ICD implantation. He has a h/o MI over 3 months ago. He underwent emergent PCI. Post procedure he has had class 2-3 CHF, despite maximal medical therapy. He had a 2D echo several weeks ago (3 months after revascularization) and his EF is 35%. He denies peripheral edema or syncope but he does have chest pain at times which is different from his MI pain and not exertional. No other complaints today. No Known Allergies   Current Outpatient Prescriptions  Medication Sig Dispense Refill  . aspirin 81 MG tablet Take 81 mg by mouth daily.        [name] atorvastatin (LIPITOR) 80 MG tablet Take 80 mg by mouth daily.        . carvedilol (COREG) 3.125 MG tablet Take 3.125 mg by mouth 2 (two) times daily with a meal.        . lisinopril (PRINIVIL,ZESTRIL) 2.5 MG tablet Take 2.5 mg by mouth daily.        . nitroGLYCERIN (NITROSTAT) 0.4 MG SL tablet Place 0.4 mg under the tongue every 5 (five) minutes as needed.        . prasugrel (EFFIENT) 10 MG TABS Take 10 mg by mouth daily.        [name] spironolactone (ALDACTONE) 25 MG tablet Take 12.5 mg by mouth daily. 1/2 tablet daily       . therapeutic multivitamin-minerals (THERAGRAN-M) tablet Take 1 tablet by mouth daily.           Past Medical History  Diagnosis Date  . Heart attack     (anterior wall)  . Dyslipidemia   . Ventricular dysfunction 07/07/2010    Severe left ventricular dysfunction with ejection fraction of 30-35%  . Personal history of MRSA (methicillin resistant Staphylococcus aureus)   . Hyperlipidemia   . Coronary artery disease   . Hypotension   . Ischemic cardiomyopathy   . Tobacco abuse     ROS:   All systems reviewed and negative except as noted in the HPI.   Past Surgical History  Procedure Date  . Cardiac catheterization 07/07/2010    with stent  . Cardiac catheterization 2004     No family history on file.   History   Social History  . Marital Status:  Single    Spouse Name: N/A    Number of Children: N/A  . Years of Education: N/A   Occupational History  . Not on file.   Social History Main Topics  . Smoking status: Former Smoker    Types: Cigarettes  . Smokeless tobacco: Not on file  . Alcohol Use: Not on file  . Drug Use: Not on file  . Sexually Active: Not on file   Other Topics Concern  . Not on file   Social History Narrative  . No narrative on file     BP 120/80  Pulse 64  Ht 5\' 6"  (1.676 m)  Wt 174 lb (78.926 kg)  BMI 28.08 kg/m2  Physical Exam:  Well appearing NAD HEENT: Unremarkable Neck:  No JVD, no thyromegally Lymphatics:  No adenopathy Back:  No CVA tenderness Lungs:  Clear except no wheezes, rales, or rhonchi. HEART:  Regular rate rhythm, no murmurs, no rubs, no clicks Abd:  Flat, positive bowel sounds, no organomegally, no rebound, no guarding Ext:  2 plus pulses, no edema, no cyanosis, no clubbing Skin:  No rashes no nodules Neuro:  CN II through XII intact, motor  grossly intact   Assess/Plan:

## 2010-11-03 ENCOUNTER — Other Ambulatory Visit (INDEPENDENT_AMBULATORY_CARE_PROVIDER_SITE_OTHER): Payer: BC Managed Care – PPO | Admitting: *Deleted

## 2010-11-03 DIAGNOSIS — I2510 Atherosclerotic heart disease of native coronary artery without angina pectoris: Secondary | ICD-10-CM

## 2010-11-03 DIAGNOSIS — I2589 Other forms of chronic ischemic heart disease: Secondary | ICD-10-CM

## 2010-11-03 DIAGNOSIS — I255 Ischemic cardiomyopathy: Secondary | ICD-10-CM

## 2010-11-03 DIAGNOSIS — I5022 Chronic systolic (congestive) heart failure: Secondary | ICD-10-CM

## 2010-11-03 DIAGNOSIS — I519 Heart disease, unspecified: Secondary | ICD-10-CM

## 2010-11-03 DIAGNOSIS — I10 Essential (primary) hypertension: Secondary | ICD-10-CM

## 2010-11-03 LAB — CBC WITH DIFFERENTIAL/PLATELET
Basophils Relative: 0.6 % (ref 0.0–3.0)
Eosinophils Absolute: 0.4 10*3/uL (ref 0.0–0.7)
Eosinophils Relative: 5.6 % — ABNORMAL HIGH (ref 0.0–5.0)
HCT: 43.3 % (ref 39.0–52.0)
Lymphs Abs: 2.4 10*3/uL (ref 0.7–4.0)
MCHC: 34.7 g/dL (ref 30.0–36.0)
MCV: 86.5 fl (ref 78.0–100.0)
Monocytes Absolute: 0.5 10*3/uL (ref 0.1–1.0)
Neutrophils Relative %: 52.2 % (ref 43.0–77.0)
Platelets: 165 10*3/uL (ref 150.0–400.0)
RBC: 5 Mil/uL (ref 4.22–5.81)
WBC: 7 10*3/uL (ref 4.5–10.5)

## 2010-11-03 LAB — PROTIME-INR: INR: 1 ratio (ref 0.8–1.0)

## 2010-11-03 LAB — BASIC METABOLIC PANEL
BUN: 22 mg/dL (ref 6–23)
CO2: 29 mEq/L (ref 19–32)
Chloride: 103 mEq/L (ref 96–112)
Creatinine, Ser: 1 mg/dL (ref 0.4–1.5)
Potassium: 4.4 mEq/L (ref 3.5–5.1)

## 2010-11-10 ENCOUNTER — Ambulatory Visit (HOSPITAL_COMMUNITY): Payer: BC Managed Care – PPO

## 2010-11-10 ENCOUNTER — Ambulatory Visit (HOSPITAL_COMMUNITY)
Admission: RE | Admit: 2010-11-10 | Discharge: 2010-11-11 | Disposition: A | Discharge status: Home / Self Care | Payer: BC Managed Care – PPO | Source: Ambulatory Visit | Attending: Internal Medicine | Admitting: Internal Medicine

## 2010-11-10 DIAGNOSIS — I5022 Chronic systolic (congestive) heart failure: Secondary | ICD-10-CM | POA: Insufficient documentation

## 2010-11-10 DIAGNOSIS — I2589 Other forms of chronic ischemic heart disease: Secondary | ICD-10-CM

## 2010-11-10 DIAGNOSIS — I509 Heart failure, unspecified: Secondary | ICD-10-CM | POA: Insufficient documentation

## 2010-11-10 DIAGNOSIS — Z01818 Encounter for other preprocedural examination: Secondary | ICD-10-CM | POA: Insufficient documentation

## 2010-11-10 DIAGNOSIS — I959 Hypotension, unspecified: Secondary | ICD-10-CM | POA: Insufficient documentation

## 2010-11-10 DIAGNOSIS — Z9861 Coronary angioplasty status: Secondary | ICD-10-CM | POA: Insufficient documentation

## 2010-11-10 DIAGNOSIS — Z8614 Personal history of Methicillin resistant Staphylococcus aureus infection: Secondary | ICD-10-CM | POA: Insufficient documentation

## 2010-11-10 DIAGNOSIS — F17200 Nicotine dependence, unspecified, uncomplicated: Secondary | ICD-10-CM | POA: Insufficient documentation

## 2010-11-10 DIAGNOSIS — E785 Hyperlipidemia, unspecified: Secondary | ICD-10-CM | POA: Insufficient documentation

## 2010-11-10 DIAGNOSIS — I252 Old myocardial infarction: Secondary | ICD-10-CM | POA: Insufficient documentation

## 2010-11-11 ENCOUNTER — Ambulatory Visit (HOSPITAL_COMMUNITY): Payer: BC Managed Care – PPO

## 2010-11-18 ENCOUNTER — Telehealth: Payer: Self-pay | Admitting: Internal Medicine

## 2010-11-18 NOTE — Telephone Encounter (Signed)
Left letter for Dr Ladona Ridgel to use to write letter for patient

## 2010-11-18 NOTE — Telephone Encounter (Signed)
Needs letter of necessity for his life vest that he wore.  She can be reached at ext. [phone].

## 2010-11-28 ENCOUNTER — Ambulatory Visit (INDEPENDENT_AMBULATORY_CARE_PROVIDER_SITE_OTHER): Payer: BC Managed Care – PPO | Admitting: *Deleted

## 2010-11-28 DIAGNOSIS — I2589 Other forms of chronic ischemic heart disease: Secondary | ICD-10-CM

## 2010-11-28 DIAGNOSIS — I255 Ischemic cardiomyopathy: Secondary | ICD-10-CM

## 2010-11-28 DIAGNOSIS — I5022 Chronic systolic (congestive) heart failure: Secondary | ICD-10-CM

## 2010-11-28 LAB — ICD DEVICE OBSERVATION
DEVICE MODEL ICD: 819170
FVT: 0
PACEART VT: 0
TOT-0007: 3
TOT-0008: 0
VENTRICULAR PACING ICD: 0 pct

## 2010-11-28 NOTE — Progress Notes (Signed)
ICD/wound check done by industry for research

## 2010-12-02 ENCOUNTER — Telehealth: Payer: Self-pay | Admitting: Cardiology

## 2010-12-02 NOTE — Telephone Encounter (Signed)
Wants to speak w/RN regarding his disability.  Patient's wife also wants to know when Carmon should come in; if it should be earlier than the end of Aug.

## 2010-12-02 NOTE — Telephone Encounter (Signed)
Here is Mr. Petitjean chart

## 2010-12-05 ENCOUNTER — Encounter: Payer: Self-pay | Admitting: Internal Medicine

## 2010-12-05 NOTE — Telephone Encounter (Signed)
Spoke with Vickie and she is looking into the disability papers and going to discuss with [employer] tomorrow.  Will call patients wife back after doing so, she advised wife

## 2010-12-07 ENCOUNTER — Ambulatory Visit (INDEPENDENT_AMBULATORY_CARE_PROVIDER_SITE_OTHER): Payer: BC Managed Care – PPO | Admitting: Nurse Practitioner

## 2010-12-07 ENCOUNTER — Encounter: Payer: Self-pay | Admitting: Nurse Practitioner

## 2010-12-07 VITALS — BP 116/84 | HR 74 | Ht 66.0 in | Wt 173.8 lb

## 2010-12-07 DIAGNOSIS — I5020 Unspecified systolic (congestive) heart failure: Secondary | ICD-10-CM

## 2010-12-07 DIAGNOSIS — I2589 Other forms of chronic ischemic heart disease: Secondary | ICD-10-CM

## 2010-12-07 DIAGNOSIS — I255 Ischemic cardiomyopathy: Secondary | ICD-10-CM

## 2010-12-07 MED ORDER — LISINOPRIL 2.5 MG PO TABS: 5.0000 mg | ORAL_TABLET | Freq: Every day | ORAL | Status: DC

## 2010-12-07 NOTE — Assessment & Plan Note (Signed)
EF is 35%. ICD is now in place. He is on ACE/beta blocker/aldactone but at very low doses. He is now more short of breath. Will check BMET and BNP today. He does not have the $ to see pulmonary if needed. I have increased the Lisinopril to 5 mg each day. Would like to see him in 2 weeks but I'm not sure he will follow thru. I tried to explain the importance of up titration of his medicines but expense is going to be a significant issue for him. I have asked him to weigh daily and continue to watch his salt.

## 2010-12-07 NOTE — Progress Notes (Signed)
Spencer Horn Date of Birth: 1950-12-24   History of Present Illness: Spencer Horn is seen today for a work in visit. He is seen for Dr. [name]. He is here with his wife. He continues with shortness of breath. He has no swelling but does have abdominal bloating. No cough. No PND or orthopnea. No chest pain. He does not weigh every day. ICD was put in last month. No shocks. He is on very low doses of his medicines. He is not smoking but has a long history of tobacco abuse. His wife thinks his breathing is getting worse.   Current Outpatient Prescriptions on File Prior to Visit  Medication Sig Dispense Refill  . aspirin 81 MG tablet Take 81 mg by mouth daily.        [name] atorvastatin (LIPITOR) 80 MG tablet Take 80 mg by mouth daily.        . carvedilol (COREG) 3.125 MG tablet Take 3.125 mg by mouth 2 (two) times daily with a meal.        . nitroGLYCERIN (NITROSTAT) 0.4 MG SL tablet Place 0.4 mg under the tongue every 5 (five) minutes as needed.        . prasugrel (EFFIENT) 10 MG TABS Take 10 mg by mouth daily.        [name] spironolactone (ALDACTONE) 25 MG tablet Take 12.5 mg by mouth daily. 1/2 tablet daily       . therapeutic multivitamin-minerals (THERAGRAN-M) tablet Take 1 tablet by mouth daily.        [name] DISCONTD: lisinopril (PRINIVIL,ZESTRIL) 2.5 MG tablet Take 2.5 mg by mouth daily.          No Known Allergies  Past Medical History  Diagnosis Date  . History of acute anterior wall MI   . Dyslipidemia   . Ventricular dysfunction 07/07/2010    Severe left ventricular dysfunction with ejection fraction of 30-35%  . Personal history of MRSA (methicillin resistant Staphylococcus aureus)   . Hyperlipidemia   . Coronary artery disease   . Ischemic cardiomyopathy   . Tobacco abuse   . ICD (implantable cardiac defibrillator) in place June 2012    Past Surgical History  Procedure Date  . Cardiac catheterization 07/07/2010    DES to proximal LAD  . Cardiac catheterization 2004  . Icd  implant June 2012    History  Smoking status  . Former Smoker  . Types: Cigarettes  Smokeless tobacco  . Not on file    History  Alcohol Use No    Family History  Problem Relation Age of Onset  . Heart disease Neg Hx     Review of Systems: The review of systems is positive for worsening dyspnea. They have lots of stress with bills and money. His disability has not come thru yet.  All other systems were reviewed and are negative.  Physical Exam: BP 116/84  Pulse 74  Ht 5\' 6"  (1.676 m)  Wt 173 lb 12.8 oz (78.835 kg)  BMI 28.05 kg/m2 Patient is somewhat anxious and in no acute distress. He looks older than his stated age. Skin is warm and dry. Color is normal.  HEENT is unremarkable. Normocephalic/atraumatic. PERRL. Sclera are nonicteric. Neck is supple. No masses. No JVD. Lungs are clear. He does have shortness of breath with ambulation in the office. Oxygen sats are 95 to 98%. Cardiac exam shows a regular rate and rhythm. No S3. Abdomen is protuberant and soft. Extremities are without edema. Gait and ROM are intact.  No gross neurologic deficits noted.  LABORATORY DATA:   Assessment / Plan:

## 2010-12-07 NOTE — Patient Instructions (Addendum)
We are going to check your labs today I want you to increase your Lisinopril to 5 mg daily I want you to try and weigh every day. Let us know if your weight goes up more than 3 pounds overnight If your labs come back ok, I will talk to Dr. [name] and see if there is some type of inhaler we can put you on for your breathing You do not oxygen

## 2010-12-08 LAB — BASIC METABOLIC PANEL
BUN: 21 mg/dL (ref 6–23)
CO2: 30 mEq/L (ref 19–32)
Calcium: 9.5 mg/dL (ref 8.4–10.5)
Chloride: 102 mEq/L (ref 96–112)
Creatinine, Ser: 1.1 mg/dL (ref 0.4–1.5)
GFR: 75.32 mL/min (ref >60.00)
Glucose, Bld: 109 mg/dL — ABNORMAL HIGH (ref 70–99)
Potassium: 5.1 mEq/L (ref 3.5–5.1)
Sodium: 142 mEq/L (ref 135–145)

## 2010-12-08 LAB — BRAIN NATRIURETIC PEPTIDE: Pro B Natriuretic peptide (BNP): 38 pg/mL (ref 0.0–100.0)

## 2010-12-08 NOTE — Op Note (Signed)
NAMEADRIEL, KESSEN                  ACCOUNT NO.:  [id number]  MEDICAL RECORD NO.:  [id number]  LOCATION:  3737                         FACILITY:  [facility]  PHYSICIAN:  Doylene Canning. Ladona Ridgel, MD    DATE OF BIRTH:  12-Feb-1951  DATE OF PROCEDURE:  11/10/2010 DATE OF DISCHARGE:                              OPERATIVE REPORT   SURGEON:  Doylene Canning. Ladona Ridgel, MD.  PROCEDURE PERFORMED:  Insertion of a single-chamber defibrillator.  INDICATIONS:  Ischemic cardiomyopathy, status post myocardial infarction, ejection fraction 30-35% despite maximal medical therapy.  INTRODUCTION:  The patient is a 60 year old man who sustained an acute myocardial infarction approximately 4 months ago.  Postprocedure, he underwent emergent percutaneous intervention.  His ejection fraction 3 months after revascularization was 30-35% by 2-D echo.  He is now referred for ICD implantation secondary to the above findings in conjunction with chronic systolic heart failure class II. PROCEDURE:  After informed consent was obtained, the patient was taken to the Diagnostic EP Lab in a fasting state.  After usual preparation and draping, intravenous fentanyl and midazolam was given for sedation. A 30 mL of lidocaine was infiltrated into the left infraclavicular region.  A 6-cm incision was carried out over this region and electrocautery was utilized to dissect down to the fascial plane.  The left subclavian vein was then punctured x1 and the St. Jude model [id number] 58-cm active fixation defibrillation lead, serial number [id number] was advanced into the right ventricle.  Mapping was carried out in the final site near the RV apex.  The R-waves measured greater than 12 mV and the pacing impedance was 780 Ohms.  Threshold was a volt at 0.5 milliseconds.  There was a large injury current with active fixation lead.  A 10-volt pacing did not stimulate the diaphragm.  With these satisfactory parameters, lead was secured to the subpectoralis  fascia with a figure-of-eight silk suture.  The sewing sleeve was also secured with silk suture.  Electrocautery was utilized to make a subcutaneous pocket.  Antibiotic irrigation was utilized to irrigate the pocket. Electrocautery was utilized to assure hemostasis.  The St. Jude single- chamber defibrillator, serial number [id number] was connected to the defibrillation lead and placed back in the subcutaneous pocket where it was secured with silk suture.  The pocket was irrigated with antibiotic irrigation, and the patient was sedated more heavily for defibrillation threshold testing.  After the patient was more deeply sedated with fentanyl and Versed, VF was induced with a DC fibr.  This was performed as T-wave shocks did not induce VF.  A 15-joule shock was subsequently delivered and ventricular fibrillation was terminated and sinus rhythm was restored.  At this point, the incision was closed with 2-0 and 3-0 Vicryl, Benzoin and Steri-Strips were painted on the skin, and the patient was returned to his room in satisfactory condition.  COMPLICATIONS:  There were no immediate procedure complications.  RESULTS:  This demonstrated successful implantation of a St. Jude single- chamber defibrillator in a patient with an ischemic cardiomyopathy, EF 30-35% by echo, class II congestive heart failure despite maximal medical therapy.     Doylene Canning. Ladona Ridgel, MD     GWT/MEDQ  D:  11/10/2010  T:  11/11/2010  Job:  [id number]  cc:   Cassell Clement, M.D.  Electronically Signed by Lewayne Bunting MD on 12/08/2010 08:34:33 AM

## 2010-12-08 NOTE — Discharge Summary (Signed)
  NAMECOLESON, KANT                  ACCOUNT NO.:  [id number]  MEDICAL RECORD NO.:  [id number]  LOCATION:  3737                         FACILITY:  [facility]  PHYSICIAN:  Doylene Canning. Ladona Ridgel, MD    DATE OF BIRTH:  1951-03-01  DATE OF ADMISSION:  11/10/2010 DATE OF DISCHARGE:  11/11/2010                              DISCHARGE SUMMARY   PROCEDURES: 1. Insertion of a single-chamber St. Jude defibrillator. 2. Two-view chest x-Dall.  PRIMARY FINAL DISCHARGE DIAGNOSIS:  Ischemic cardiomyopathy with an ejection fraction of approximately 30% despite maximal medical therapy.  SECONDARY DIAGNOSES: 1. Ischemic cardiomyopathy with an ejection fraction of 35% by     echocardiogram in May 2012. 2. Acute ST elevation myocardial infarction in February 2012 with 3.0     x 38 mm ION stent to the left anterior descending. 3. Hypotension, limiting the use of beta blockers and ACE inhibitors. 4. Hyperlipidemia. 5. Tobacco abuse. 6. History of methicillin-resistant Staphylococcus aureus     thrombophlebitis, treated with intravenous vancomycin. 7. History of left cephalic vein, superficial thrombosis, no deep     venous thrombosis. 8. History of myocardial infarction in 2004 with stent to the left     anterior descending.  TIME AT DISCHARGE:  34 minutes.  HOSPITAL COURSE:  Spencer Horn is a 60 year old male with ischemic cardiomyopathy who had left ventricular dysfunction despite maximal medical therapy.  He was evaluated by Dr. Ladona Ridgel for an ICD and admitted to the hospital for the procedure on November 10, 2010.  He had a St. Jude single-chamber defibrillator inserted without complication.  On November 11, 2010, the device was present with no pneumothorax and the lungs were clear.  He is felt to have chronic class II systolic CHF, but his volume status was at baseline.  The chest x-Lorenzi showed clear lungs and no pneumothorax.  The device check showed normal device function.  Mr. Zufall is considered stable for  discharge, to follow up as an outpatient.  DISCHARGE INSTRUCTIONS:  His activity level is to be increased per the discharge instruction sheet.  He is to call our office for problems with the incision.  He is encouraged to stick to a low-sodium, heart-healthy diet.  He is to follow up with a wound check in 10-14 days and with Dr. Ladona Ridgel in 3 months.  He is to follow up with Dr. [name] as well.  DISCHARGE MEDICATIONS: 1. Lisinopril 2.5 mg daily. 2. Coreg 3.125 mg b.i.d. 3. Lipitor 80 mg daily. 4. Spironolactone 25 mg 1/2 tablet daily. 5. Multivitamins daily. 6. Sublingual nitroglycerin p.r.n. 7. Vicodin p.r.n. for pain at the incision. 8. Prasugrel 10 mg a day.     Theodore Demark, PA-C   ______________________________ Doylene Canning. Ladona Ridgel, MD    RB/MEDQ  D:  11/11/2010  T:  11/12/2010  Job:  [id number]  cc:   Dr. Jeannetta Nap  Electronically Signed by Theodore Demark PA-C on 12/06/2010 03:50:52 PM Electronically Signed by Lewayne Bunting MD on 12/08/2010 08:34:39 AM

## 2010-12-09 ENCOUNTER — Telehealth: Payer: Self-pay | Admitting: *Deleted

## 2010-12-09 NOTE — Telephone Encounter (Signed)
Message copied by Burnell Blanks on Fri Dec 09, 2010  5:17 PM ------      Message from: Rosalio Macadamia      Created: Fri Dec 09, 2010  8:06 AM       Ok to report. Labs are satisfactory. BNP is low. His shortness of breath looks like it is more pulmonary. Will have Juliette Alcide speak to Dr. [name] regarding possible inhaler.

## 2010-12-09 NOTE — Progress Notes (Signed)
Advised 

## 2010-12-09 NOTE — Telephone Encounter (Signed)
Advised of labs.   Dr. [name] out of office, will discuss inhaler with Lawson Fiscal on Monday.  Advised wife and will call her back next week

## 2010-12-14 ENCOUNTER — Telehealth: Payer: Self-pay | Admitting: *Deleted

## 2010-12-14 DIAGNOSIS — J449 Chronic obstructive pulmonary disease, unspecified: Secondary | ICD-10-CM

## 2010-12-14 MED ORDER — BUDESONIDE-FORMOTEROL FUMARATE 160-4.5 MCG/ACT IN AERO: 2.0000 | INHALATION_SPRAY | Freq: Two times a day (BID) | RESPIRATORY_TRACT | Status: DC

## 2010-12-14 NOTE — Telephone Encounter (Signed)
Spoke with Lawson Fiscal regarding patient and Spiriva cost.  Will try Symbicort since we have samples.  Will see in 3-4 weeks for follow up instead of next week.  If worsening shortness of breath, discontinue inhaler

## 2010-12-14 NOTE — Telephone Encounter (Signed)
Spoke with Lawson Fiscal since  Dr. [name] out of office for the week.  She recommended Spiriva and called to pharmacy.  Cost was too expensive per wife.  Printed of benefit coverage plan to try to find an alternative. Will give to [facility] for review and call patient/wife back to discuss

## 2010-12-14 NOTE — Telephone Encounter (Signed)
Message copied by Burnell Blanks on Wed Dec 14, 2010 10:41 AM ------      Message from: Rosalio Macadamia      Created: Fri Dec 09, 2010  8:06 AM       Ok to report. Labs are satisfactory. BNP is low. His shortness of breath looks like it is more pulmonary. Will have Juliette Alcide speak to Dr. [name] regarding possible inhaler.

## 2010-12-14 NOTE — Progress Notes (Signed)
Advised wife of labs 

## 2010-12-15 ENCOUNTER — Telehealth: Payer: Self-pay | Admitting: *Deleted

## 2010-12-15 DIAGNOSIS — I119 Hypertensive heart disease without heart failure: Secondary | ICD-10-CM

## 2010-12-15 NOTE — Telephone Encounter (Signed)
Patient and wife came in to get samples of Symbicort.  Patient stated his

## 2010-12-16 NOTE — Telephone Encounter (Signed)
Discussed with Lawson Fiscal and will Rx Lasix 40 mg daily as needed.  Advised wife

## 2010-12-20 ENCOUNTER — Other Ambulatory Visit: Payer: Self-pay | Admitting: Cardiology

## 2010-12-20 MED ORDER — FUROSEMIDE 40 MG PO TABS: ORAL_TABLET | ORAL | Status: DC

## 2010-12-20 NOTE — Telephone Encounter (Signed)
Left message for med clarification of Lasix.

## 2010-12-20 NOTE — Telephone Encounter (Signed)
Called needing a refill of her husbands Lasik water pill refilled at [facility] on [address] [phone]. Please call back. Please call back and feel free to leave a message. I could not find his chart.

## 2010-12-21 ENCOUNTER — Ambulatory Visit: Payer: BC Managed Care – PPO | Admitting: Nurse Practitioner

## 2010-12-29 MED ORDER — FUROSEMIDE 40 MG PO TABS: ORAL_TABLET | ORAL | Status: DC

## 2011-01-18 ENCOUNTER — Encounter: Payer: Self-pay | Admitting: Cardiology

## 2011-01-18 ENCOUNTER — Ambulatory Visit (INDEPENDENT_AMBULATORY_CARE_PROVIDER_SITE_OTHER): Payer: BC Managed Care – PPO | Admitting: Cardiology

## 2011-01-18 DIAGNOSIS — I2510 Atherosclerotic heart disease of native coronary artery without angina pectoris: Secondary | ICD-10-CM

## 2011-01-18 DIAGNOSIS — I509 Heart failure, unspecified: Secondary | ICD-10-CM

## 2011-01-18 DIAGNOSIS — E785 Hyperlipidemia, unspecified: Secondary | ICD-10-CM

## 2011-01-18 DIAGNOSIS — I119 Hypertensive heart disease without heart failure: Secondary | ICD-10-CM

## 2011-01-18 DIAGNOSIS — I10 Essential (primary) hypertension: Secondary | ICD-10-CM

## 2011-01-18 DIAGNOSIS — I259 Chronic ischemic heart disease, unspecified: Secondary | ICD-10-CM

## 2011-01-18 DIAGNOSIS — E7800 Pure hypercholesterolemia, unspecified: Secondary | ICD-10-CM

## 2011-01-18 MED ORDER — PRASUGREL HCL 10 MG PO TABS: 10.0000 mg | ORAL_TABLET | Freq: Every day | ORAL | Status: DC

## 2011-01-18 MED ORDER — LISINOPRIL 5 MG PO TABS: 5.0000 mg | ORAL_TABLET | Freq: Every day | ORAL | Status: DC

## 2011-01-18 MED ORDER — ATORVASTATIN CALCIUM 80 MG PO TABS: 80.0000 mg | ORAL_TABLET | Freq: Every day | ORAL | Status: DC

## 2011-01-18 MED ORDER — CARVEDILOL 3.125 MG PO TABS: 3.1250 mg | ORAL_TABLET | Freq: Two times a day (BID) | ORAL | Status: DC

## 2011-01-18 MED ORDER — SPIRONOLACTONE 25 MG PO TABS: ORAL_TABLET | ORAL | Status: DC

## 2011-01-18 NOTE — Assessment & Plan Note (Signed)
The patient has had no recurrent angina pectoris. 

## 2011-01-18 NOTE — Assessment & Plan Note (Signed)
His blood pressure has been remaining normal on his present medications.

## 2011-01-18 NOTE — Progress Notes (Signed)
Spencer Horn Date of Birth:  02/16/1951 [facility] / [facility] [address]   [address] [place], [name]  [address] [phone]           Fax   [phone]  History of Present Illness: This pleasant 60 year old gentleman has a history of ischemic Cardiomyopathy.  He had his first heart attack while living in [place] in 2004.  We did a treadmill Cardiolite in February 2011 which showed no ischemia.  On 07/07/10 the patient had a acute anterior wall myocardial infarction treated with emergency cardiac catheterization and stenting with a drug-eluting stent.  His echocardiogram on 07/07/10 showed an ejection fraction of 30-35% with akinesis of the entire apical myocardium as well as the mid to distal anteroseptal myocardial.  During his hospital course he had problems with low cardiac output and low blood pressure.  He was sent home with a life vest and after 3 months of wearing the wife asked his left ventricular function had not improved and so he was given an implantable defibrillator on June 21 by Dr. Ladona Ridgel the patient has had no discharges from his ICD.  He has easy fatigue and exertional dyspnea.  He is on disability mouth because of his heart problems.  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  Current Outpatient Prescriptions  Medication Sig Dispense Refill  . aspirin 81 MG tablet Take 81 mg by mouth daily.        [name] atorvastatin (LIPITOR) 80 MG tablet Take 1 tablet (80 mg total) by mouth daily.  30 tablet  11  . budesonide-formoterol (SYMBICORT) 160-4.5 MCG/ACT inhaler Inhale 2 puffs into the lungs 2 (two) times daily.  1 Inhaler  12  . carvedilol (COREG) 3.125 MG tablet Take 1 tablet (3.125 mg total) by mouth 2 (two) times daily with a meal.  60 tablet  11  . furosemide (LASIX) 40 MG tablet One daily as needed  30 tablet  11  . lisinopril (PRINIVIL,ZESTRIL) 5 MG tablet Take 1 tablet (5 mg total) by mouth daily.  30 tablet  11  .  nitroGLYCERIN (NITROSTAT) 0.4 MG SL tablet Place 0.4 mg under the tongue every 5 (five) minutes as needed.        . prasugrel (EFFIENT) 10 MG TABS Take 1 tablet (10 mg total) by mouth daily.  30 tablet  11  . spironolactone (ALDACTONE) 25 MG tablet 1/2 tablet daily  30 tablet  11  . therapeutic multivitamin-minerals (THERAGRAN-M) tablet Take 1 tablet by mouth daily.        [name] DISCONTD: atorvastatin (LIPITOR) 80 MG tablet Take 80 mg by mouth daily.        [name] DISCONTD: carvedilol (COREG) 3.125 MG tablet Take 3.125 mg by mouth 2 (two) times daily with a meal.        . DISCONTD: lisinopril (PRINIVIL,ZESTRIL) 2.5 MG tablet Take 2 tablets (5 mg total) by mouth daily.  30 tablet  6  . DISCONTD: prasugrel (EFFIENT) 10 MG TABS Take 10 mg by mouth daily.        [name] DISCONTD: spironolactone (ALDACTONE) 25 MG tablet Take 12.5 mg by mouth daily. 1/2 tablet daily         No Known Allergies  Patient Active Problem List  Diagnoses  . Coronary artery disease  . Hyperlipidemia  . Ventricular dysfunction  . Dyslipidemia  . Hypertension  . Ischemic cardiomyopathy  . Chronic systolic heart failure    History  Smoking status  .  Former Smoker  . Types: Cigarettes  Smokeless tobacco  . Not on file    History  Alcohol Use No    Family History  Problem Relation Age of Onset  . Heart disease Neg Hx     Review of Systems: Constitutional: no fever chills diaphoresis or fatigue or change in weight.  Head and neck: no hearing loss, no epistaxis, no photophobia or visual disturbance. Respiratory: No cough, shortness of breath or wheezing. Cardiovascular: No chest pain peripheral edema, palpitations. Gastrointestinal: No abdominal distention, no abdominal pain, no change in bowel habits hematochezia or melena. Genitourinary: No dysuria, no frequency, no urgency, no nocturia. Musculoskeletal:No arthralgias, no back pain, no gait disturbance or myalgias. Neurological: No dizziness, no headaches, no  numbness, no seizures, no syncope, no weakness, no tremors. Hematologic: No lymphadenopathy, no easy bruising. Psychiatric: No confusion, no hallucinations, no sleep disturbance.    Physical Exam: Filed Vitals:   01/18/11 1433  BP: 104/78  Pulse: 70  The general appearance reveals a well-developed well-nourished gentleman in no distress.The head and neck exam reveals pupils equal and reactive.  Extraocular movements are full.  There is no scleral icterus.  The mouth and pharynx are normal.  The neck is supple.  The carotids reveal no bruits.  The jugular venous pressure is normal.  The  thyroid is not enlarged.  There is no lymphadenopathy.  The chest is clear to percussion and auscultation.  There are no rales or rhonchi.  Expansion of the chest is symmetrical.  The precordium is quiet.  The first heart sound is normal.  The second heart sound is physiologically split.  There is no murmur gallop rub or click.  There is no abnormal lift or heave.  The abdomen is soft and nontender.  The bowel sounds are normal.  The liver and spleen are not enlarged.  There are no abdominal masses.  There are no abdominal bruits.  Extremities reveal good pedal pulses.  There is no phlebitis or edema.  There is no cyanosis or clubbing.  Strength is normal and symmetrical in all extremities.  There is no lateralizing weakness.  There are no sensory deficits.  The skin is warm and dry.  There is no rash.     Assessment / Plan: Continue same medication.  Recheck in 3 months for followup office visit EKG, and fasting lab work

## 2011-01-18 NOTE — Assessment & Plan Note (Signed)
The patient has a history of dyslipidemia.  He has been on atorvastatin 80 mg daily.  His most recent LDL was 70.  The patient is not having any side effects from the statin therapy

## 2011-02-07 ENCOUNTER — Encounter: Payer: BC Managed Care – PPO | Admitting: Internal Medicine

## 2011-02-21 ENCOUNTER — Ambulatory Visit (INDEPENDENT_AMBULATORY_CARE_PROVIDER_SITE_OTHER): Payer: BC Managed Care – PPO | Admitting: Internal Medicine

## 2011-02-21 ENCOUNTER — Encounter (INDEPENDENT_AMBULATORY_CARE_PROVIDER_SITE_OTHER): Payer: BC Managed Care – PPO

## 2011-02-21 ENCOUNTER — Encounter: Payer: Self-pay | Admitting: Internal Medicine

## 2011-02-21 DIAGNOSIS — I2589 Other forms of chronic ischemic heart disease: Secondary | ICD-10-CM

## 2011-02-21 DIAGNOSIS — I428 Other cardiomyopathies: Secondary | ICD-10-CM

## 2011-02-21 DIAGNOSIS — I255 Ischemic cardiomyopathy: Secondary | ICD-10-CM

## 2011-02-21 DIAGNOSIS — Z95810 Presence of automatic (implantable) cardiac defibrillator: Secondary | ICD-10-CM | POA: Insufficient documentation

## 2011-02-21 DIAGNOSIS — R0989 Other specified symptoms and signs involving the circulatory and respiratory systems: Secondary | ICD-10-CM

## 2011-02-21 DIAGNOSIS — I5022 Chronic systolic (congestive) heart failure: Secondary | ICD-10-CM

## 2011-02-21 LAB — ICD DEVICE OBSERVATION
BRDY-0002RV: 40 {beats}/min
DEVICE MODEL ICD: 819170
FVT: 0
PACEART VT: 0
RV LEAD IMPEDENCE ICD: 537.5 Ohm
RV LEAD THRESHOLD: 0.75 V
TOT-0007: 3
TOT-0008: 0
VF: 0

## 2011-02-21 NOTE — Assessment & Plan Note (Signed)
His symptoms very between class II and class III. I have encouraged him to maintain a low-sodium diet, continue his current medical therapy, and increase his physical activity by walking for 30-60 minutes every day.

## 2011-02-21 NOTE — Assessment & Plan Note (Signed)
He denies anginal symptoms. He will continue his current medical therapy. 

## 2011-02-21 NOTE — Progress Notes (Signed)
HPI Spencer Horn returns today for followup. He is a pleasant 60 yo man with an ICM, s/p MI, chronic systolic heart failure, class 2, EF 30%, s/p ICD. Since ICD implant, he has increased his activity. He walks approximately 30 minutes a day. He notes that when he gets in a hurry he will get short of breath. He denies syncope, chest pain, or peripheral edema. No Known Allergies   Current Outpatient Prescriptions  Medication Sig Dispense Refill  . aspirin 81 MG tablet Take 81 mg by mouth daily.        [name] atorvastatin (LIPITOR) 80 MG tablet Take 1 tablet (80 mg total) by mouth daily.  30 tablet  11  . carvedilol (COREG) 3.125 MG tablet Take 1 tablet (3.125 mg total) by mouth 2 (two) times daily with a meal.  60 tablet  11  . furosemide (LASIX) 40 MG tablet One daily as needed  30 tablet  11  . lisinopril (PRINIVIL,ZESTRIL) 5 MG tablet Take 1 tablet (5 mg total) by mouth daily.  30 tablet  11  . nitroGLYCERIN (NITROSTAT) 0.4 MG SL tablet Place 0.4 mg under the tongue every 5 (five) minutes as needed.        . prasugrel (EFFIENT) 10 MG TABS Take 1 tablet (10 mg total) by mouth daily.  30 tablet  11  . spironolactone (ALDACTONE) 25 MG tablet 1/2 tablet daily  30 tablet  11  . therapeutic multivitamin-minerals (THERAGRAN-M) tablet Take 1 tablet by mouth daily.           Past Medical History  Diagnosis Date  . History of acute anterior wall MI   . Dyslipidemia   . Ventricular dysfunction 07/07/2010    Severe left ventricular dysfunction with ejection fraction of 30-35%  . Personal history of MRSA (methicillin resistant Staphylococcus aureus)   . Hyperlipidemia   . Coronary artery disease   . Ischemic cardiomyopathy   . Tobacco abuse   . ICD (implantable cardiac defibrillator) in place June 2012    ROS:   All systems reviewed and negative except as noted in the HPI.   Past Surgical History  Procedure Date  . Cardiac catheterization 07/07/2010    DES to proximal LAD  . Cardiac  catheterization 2004  . Icd implant June 2012     Family History  Problem Relation Age of Onset  . Heart disease Neg Hx      History   Social History  . Marital Status: Single    Spouse Name: N/A    Number of Children: N/A  . Years of Education: N/A   Occupational History  . Not on file.   Social History Main Topics  . Smoking status: Former Smoker    Types: Cigarettes  . Smokeless tobacco: Not on file  . Alcohol Use: No  . Drug Use: No  . Sexually Active: Yes   Other Topics Concern  . Not on file   Social History Narrative  . No narrative on file     BP 118/78  Pulse 78  Ht 5\' 6"  (1.676 m)  Wt 180 lb 12.8 oz (82.01 kg)  BMI 29.18 kg/m2  Physical Exam:  Well appearing NAD HEENT: Unremarkable Neck:  No JVD, no thyromegally Lymphatics:  No adenopathy Back:  No CVA tenderness Lungs:  Clear no wheezes, rales, or rhonchi. Well-healed ICD incision. HEART:  Regular rate rhythm, no murmurs, no rubs, no clicks Abd:  soft, positive bowel sounds, no organomegally, no rebound, no guarding Ext:  2 plus pulses, no edema, no cyanosis, no clubbing Skin:  No rashes no nodules Neuro:  CN II through XII intact, motor grossly intact  EKG Normal sinus rhythm, prior anterior MI. DEVICE  Normal device function.  See PaceArt for details.   Assess/Plan:

## 2011-02-21 NOTE — Assessment & Plan Note (Signed)
His device is working normally. We'll plan to recheck in several months. 

## 2011-02-21 NOTE — Patient Instructions (Signed)
Your physician wants you to follow-up in: 6 months with device clinic and 12 months with Dr Court Joy will receive a reminder letter in the mail two months in advance. If you don't receive a letter, please call our office to schedule the follow-up appointment.  Remote monitoring is used to monitor your Pacemaker of ICD from home. This monitoring reduces the number of office visits required to check your device to one time per year. It allows [place] to keep an eye on the functioning of your device to ensure it is working properly. You are scheduled for a device check from home on 1/./2013 You may send your transmission at any time that day. If you have a wireless device, the transmission will be sent automatically. After your physician reviews your transmission, you will receive a postcard with your next transmission date.

## 2011-02-23 ENCOUNTER — Encounter: Payer: Self-pay | Admitting: *Deleted

## 2011-02-23 ENCOUNTER — Telehealth: Payer: Self-pay | Admitting: Cardiology

## 2011-02-23 NOTE — Telephone Encounter (Signed)
Pt wife calling stating that pt has been called for [employer] and pt needs letter from MD stating why pt cannot do [employer]. Please return pt wife call to discuss further.

## 2011-02-23 NOTE — Telephone Encounter (Signed)
Letter done and mailed to patient 

## 2011-04-17 ENCOUNTER — Other Ambulatory Visit (INDEPENDENT_AMBULATORY_CARE_PROVIDER_SITE_OTHER): Payer: BC Managed Care – PPO | Admitting: *Deleted

## 2011-04-17 DIAGNOSIS — E7800 Pure hypercholesterolemia, unspecified: Secondary | ICD-10-CM

## 2011-04-17 LAB — HEPATIC FUNCTION PANEL
ALT: 21 U/L (ref 0–53)
AST: 26 U/L (ref 0–37)
Alkaline Phosphatase: 73 U/L (ref 39–117)
Bilirubin, Direct: 0.1 mg/dL (ref 0.0–0.3)
Total Bilirubin: 1 mg/dL (ref 0.3–1.2)
Total Protein: 7.6 g/dL (ref 6.0–8.3)

## 2011-04-17 LAB — LIPID PANEL
Cholesterol: 105 mg/dL (ref 0–200)
HDL: 35.3 mg/dL — ABNORMAL LOW (ref >39.00)
LDL Cholesterol: 49 mg/dL (ref 0–99)
Total CHOL/HDL Ratio: 3
Triglycerides: 105 mg/dL (ref 0.0–149.0)
VLDL: 21 mg/dL (ref 0.0–40.0)

## 2011-04-17 LAB — BASIC METABOLIC PANEL
BUN: 24 mg/dL — ABNORMAL HIGH (ref 6–23)
CO2: 26 mEq/L (ref 19–32)
Calcium: 9 mg/dL (ref 8.4–10.5)
Chloride: 102 mEq/L (ref 96–112)
Creatinine, Ser: 1 mg/dL (ref 0.4–1.5)
GFR: 84.25 mL/min (ref >60.00)
Glucose, Bld: 96 mg/dL (ref 70–99)
Potassium: 4.9 mEq/L (ref 3.5–5.1)
Sodium: 136 mEq/L (ref 135–145)

## 2011-04-21 ENCOUNTER — Ambulatory Visit (INDEPENDENT_AMBULATORY_CARE_PROVIDER_SITE_OTHER): Payer: BC Managed Care – PPO | Admitting: Cardiology

## 2011-04-21 ENCOUNTER — Encounter: Payer: Self-pay | Admitting: Cardiology

## 2011-04-21 VITALS — BP 110/78 | HR 81 | Ht 66.0 in | Wt 182.0 lb

## 2011-04-21 DIAGNOSIS — E785 Hyperlipidemia, unspecified: Secondary | ICD-10-CM

## 2011-04-21 DIAGNOSIS — I119 Hypertensive heart disease without heart failure: Secondary | ICD-10-CM

## 2011-04-21 DIAGNOSIS — E7800 Pure hypercholesterolemia, unspecified: Secondary | ICD-10-CM

## 2011-04-21 DIAGNOSIS — I519 Heart disease, unspecified: Secondary | ICD-10-CM

## 2011-04-21 DIAGNOSIS — I2510 Atherosclerotic heart disease of native coronary artery without angina pectoris: Secondary | ICD-10-CM

## 2011-04-21 NOTE — Assessment & Plan Note (Signed)
The patient has not been experiencing any recurrent angina pectoris. 

## 2011-04-21 NOTE — Assessment & Plan Note (Signed)
The patient has a past history of hypercholesterolemia.  He has had a good response to Lipitor 80 mg daily.  No side effects from the Lipitor

## 2011-04-21 NOTE — Patient Instructions (Signed)
Your physician recommends that you continue on your current medications as directed. Please refer to the Current Medication list given to you today. Your physician recommends that you schedule a follow-up appointment in: 4 months with fasting labs (lp/bmet/hfp/ekg)   

## 2011-04-21 NOTE — Progress Notes (Signed)
Spencer Horn Date of Birth:  Feb 25, 1951 [facility] / [facility] [address]   [address] [place], [name]  [address] [phone]           Fax   [phone]  History of Present Illness: This pleasant 60 year old gentleman is seen for a scheduled followup office visit.  He has a history of ischemic cardiomyopathy.  He had initial heart attack in 2004 while living in [place].  In 2011 he had a treadmill Cardiolite which was negative.  In February 2012 acute anterior wall myocardial infarction treated with emergency cardiac catheterization and stenting with a drug-eluting stent.  He had significant damage from his infarct with akinesis of the entire anterior apical myocardial and he went home with a life vest and after 3 months of life vest his LV function was still low and he underwent implantation of a defibrillator on 11/10/2010 by Dr. Ladona Ridgel.  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  Current Outpatient Prescriptions  Medication Sig Dispense Refill  . aspirin 81 MG tablet Take 81 mg by mouth daily.        [name] atorvastatin (LIPITOR) 80 MG tablet Take 1 tablet (80 mg total) by mouth daily.  30 tablet  11  . carvedilol (COREG) 3.125 MG tablet Take 1 tablet (3.125 mg total) by mouth 2 (two) times daily with a meal.  60 tablet  11  . furosemide (LASIX) 40 MG tablet One daily as needed  30 tablet  11  . lisinopril (PRINIVIL,ZESTRIL) 5 MG tablet Take 1 tablet (5 mg total) by mouth daily.  30 tablet  11  . nitroGLYCERIN (NITROSTAT) 0.4 MG SL tablet Place 0.4 mg under the tongue every 5 (five) minutes as needed.        . prasugrel (EFFIENT) 10 MG TABS Take 1 tablet (10 mg total) by mouth daily.  30 tablet  11  . spironolactone (ALDACTONE) 25 MG tablet 1/2 tablet daily  30 tablet  11  . therapeutic multivitamin-minerals (THERAGRAN-M) tablet Take 1 tablet by mouth daily.          No Known  Allergies  Patient Active Problem List  Diagnoses  . Coronary artery disease  . Hyperlipidemia  . Ventricular dysfunction  . Dyslipidemia  . Hypertension  . Ischemic cardiomyopathy  . Chronic systolic heart failure  . ICD (implantable cardiac defibrillator), dual, in situ    History  Smoking status  . Former Smoker  . Types: Cigarettes  Smokeless tobacco  . Not on file    History  Alcohol Use No    Family History  Problem Relation Age of Onset  . Heart disease Neg Hx     Review of Systems: Constitutional: no fever chills diaphoresis or fatigue or change in weight.  Head and neck: no hearing loss, no epistaxis, no photophobia or visual disturbance. Respiratory: No cough, shortness of breath or wheezing. Cardiovascular: No chest pain peripheral edema, palpitations. Gastrointestinal: No abdominal distention, no abdominal pain, no change in bowel habits hematochezia or melena. Genitourinary: No dysuria, no frequency, no urgency, no nocturia. Musculoskeletal:No arthralgias, no back pain, no gait disturbance or myalgias. Neurological: No dizziness, no headaches, no numbness, no seizures, no syncope, no weakness, no tremors. Hematologic: No lymphadenopathy, no easy bruising. Psychiatric: No confusion, no hallucinations, no sleep disturbance.    Physical Exam: Filed Vitals:   04/21/11 1328  BP: 110/78  Pulse: 81  general appearance reveals a well-developed well-nourished gentleman looking slightly older than his stated age.The head and neck exam reveals pupils equal and reactive.  Extraocular movements are full.  There is no scleral icterus.  The mouth and pharynx are normal.  The neck is supple.  The carotids reveal no bruits.  The jugular venous pressure is normal.  The  thyroid is not enlarged.  There is no lymphadenopathy.  The chest is clear to percussion and auscultation.  There are no rales or rhonchi.  Expansion of the chest is symmetrical.  The precordium is quiet.   The first heart sound is normal.  The second heart sound is physiologically split.  There is no murmur gallop rub or click.  There is no abnormal lift or heave.  The abdomen is soft and nontender.  The bowel sounds are normal.  The liver and spleen are not enlarged.  There are no abdominal masses.  There are no abdominal bruits.  Extremities reveal good pedal pulses.  There is no phlebitis or edema.  There is no cyanosis or clubbing.  Strength is normal and symmetrical in all extremities.  There is no lateralizing weakness.  There are no sensory deficits.  The skin is warm and dry.  There is no rash.  EKG today shows normal sinus rhythm with pattern of an old anteroseptal myocardial infarction and since 07/28/10 his T waves have now normalized.   Assessment / Plan:  Continue same medication and recheck in 4 months for followup office visit fasting lab work and EKG

## 2011-04-21 NOTE — Assessment & Plan Note (Signed)
The patient has exertional dyspnea secondary to his severe LV dysfunction.  He has poor stamina and tires quickly

## 2011-05-04 ENCOUNTER — Other Ambulatory Visit: Payer: Self-pay | Admitting: Cardiovascular Disease

## 2011-05-25 ENCOUNTER — Encounter: Payer: BC Managed Care – PPO | Admitting: *Deleted

## 2011-05-26 ENCOUNTER — Telehealth: Payer: Self-pay | Admitting: Cardiology

## 2011-05-26 NOTE — Telephone Encounter (Signed)
Patient bringing papers by Monday morning.  Advised ok to let front know I am expecting her so she can hand them to me directly.

## 2011-05-26 NOTE — Telephone Encounter (Signed)
Follow up from previous call: returning nurse called .

## 2011-05-26 NOTE — Telephone Encounter (Signed)
New msg Pt's wife called She wanted to talk to you about husband's disability paperwork

## 2011-05-26 NOTE — Telephone Encounter (Signed)
Left message

## 2011-05-29 ENCOUNTER — Encounter: Payer: Self-pay | Admitting: *Deleted

## 2011-05-31 ENCOUNTER — Ambulatory Visit (INDEPENDENT_AMBULATORY_CARE_PROVIDER_SITE_OTHER): Payer: BC Managed Care – PPO | Admitting: *Deleted

## 2011-05-31 DIAGNOSIS — I428 Other cardiomyopathies: Secondary | ICD-10-CM

## 2011-06-02 ENCOUNTER — Encounter: Payer: Self-pay | Admitting: Internal Medicine

## 2011-06-08 NOTE — Progress Notes (Signed)
Remote icd check  

## 2011-06-14 ENCOUNTER — Encounter: Payer: Self-pay | Admitting: *Deleted

## 2011-08-11 ENCOUNTER — Other Ambulatory Visit (INDEPENDENT_AMBULATORY_CARE_PROVIDER_SITE_OTHER): Payer: BC Managed Care – PPO

## 2011-08-11 DIAGNOSIS — I119 Hypertensive heart disease without heart failure: Secondary | ICD-10-CM

## 2011-08-11 DIAGNOSIS — E785 Hyperlipidemia, unspecified: Secondary | ICD-10-CM

## 2011-08-11 DIAGNOSIS — E7800 Pure hypercholesterolemia, unspecified: Secondary | ICD-10-CM

## 2011-08-11 LAB — BASIC METABOLIC PANEL
BUN: 20 mg/dL (ref 6–23)
CO2: 26 mEq/L (ref 19–32)
Calcium: 9.1 mg/dL (ref 8.4–10.5)
Chloride: 98 mEq/L (ref 96–112)
Creatinine, Ser: 1 mg/dL (ref 0.4–1.5)
Glucose, Bld: 96 mg/dL (ref 70–99)

## 2011-08-11 LAB — HEPATIC FUNCTION PANEL
AST: 25 U/L (ref 0–37)
Alkaline Phosphatase: 78 U/L (ref 39–117)
Bilirubin, Direct: 0.1 mg/dL (ref 0.0–0.3)
Total Bilirubin: 0.7 mg/dL (ref 0.3–1.2)

## 2011-08-11 LAB — LIPID PANEL
LDL Cholesterol: 38 mg/dL (ref 0–99)
Total CHOL/HDL Ratio: 3
Triglycerides: 119 mg/dL (ref 0.0–149.0)

## 2011-08-11 NOTE — Progress Notes (Signed)
Quick Note:  Please make copy of labs for patient visit. ______ 

## 2011-08-14 ENCOUNTER — Encounter: Payer: Self-pay | Admitting: Cardiology

## 2011-08-14 ENCOUNTER — Ambulatory Visit (INDEPENDENT_AMBULATORY_CARE_PROVIDER_SITE_OTHER): Payer: BC Managed Care – PPO | Admitting: Cardiology

## 2011-08-14 ENCOUNTER — Encounter: Payer: Self-pay | Admitting: Internal Medicine

## 2011-08-14 ENCOUNTER — Ambulatory Visit (INDEPENDENT_AMBULATORY_CARE_PROVIDER_SITE_OTHER): Payer: BC Managed Care – PPO | Admitting: *Deleted

## 2011-08-14 VITALS — BP 110/78 | HR 82 | Ht 66.0 in | Wt 184.0 lb

## 2011-08-14 DIAGNOSIS — R1013 Epigastric pain: Secondary | ICD-10-CM

## 2011-08-14 DIAGNOSIS — E785 Hyperlipidemia, unspecified: Secondary | ICD-10-CM

## 2011-08-14 DIAGNOSIS — I2510 Atherosclerotic heart disease of native coronary artery without angina pectoris: Secondary | ICD-10-CM

## 2011-08-14 DIAGNOSIS — Z95810 Presence of automatic (implantable) cardiac defibrillator: Secondary | ICD-10-CM

## 2011-08-14 DIAGNOSIS — I5022 Chronic systolic (congestive) heart failure: Secondary | ICD-10-CM

## 2011-08-14 DIAGNOSIS — K3189 Other diseases of stomach and duodenum: Secondary | ICD-10-CM

## 2011-08-14 DIAGNOSIS — E7800 Pure hypercholesterolemia, unspecified: Secondary | ICD-10-CM

## 2011-08-14 DIAGNOSIS — I119 Hypertensive heart disease without heart failure: Secondary | ICD-10-CM

## 2011-08-14 DIAGNOSIS — I519 Heart disease, unspecified: Secondary | ICD-10-CM

## 2011-08-14 LAB — ICD DEVICE OBSERVATION
DEVICE MODEL ICD: 819170
FVT: 0
HV IMPEDENCE: 90 Ohm
PACEART VT: 0
RV LEAD AMPLITUDE: 12 mv
RV LEAD IMPEDENCE ICD: 475 Ohm
RV LEAD THRESHOLD: 0.75 V
TOT-0008: 0
TOT-0009: 2

## 2011-08-14 MED ORDER — OMEPRAZOLE 40 MG PO CPDR: 40.0000 mg | DELAYED_RELEASE_CAPSULE | Freq: Every day | ORAL | Status: DC

## 2011-08-14 NOTE — Assessment & Plan Note (Signed)
Patient has not had any shocks from his defibrillator that he has been aware of.

## 2011-08-14 NOTE — Assessment & Plan Note (Signed)
The patient has had no recurrent chest pain or angina 

## 2011-08-14 NOTE — Patient Instructions (Signed)
Add Omeprazole 40 mg daily, Rx sent to [facility]  Your physician wants you to follow-up in: 4 months You will receive a reminder letter in the mail two months in advance. If you don't receive a letter, please call our office to schedule the follow-up appointment.

## 2011-08-14 NOTE — Progress Notes (Signed)
ICD check by industry for research 

## 2011-08-14 NOTE — Progress Notes (Signed)
Spencer Horn Date of Birth:  1950-05-26 [facility] [address] [place], [name]  [address] [phone]         Fax   [phone]  History of Present Illness: This pleasant 61 year old gentleman is seen for a scheduled followup office visit. Has a history of ischemic cardiomyopathy.  He had his first heart attack in 2004 while living in [place].  In 2011 he had a treadmill Cardiolite stress test which was normal February 2012 he had an acute anterior wall myocardial infarction treated with emergency cardiac catheterization and he was given a drug-eluting stent.  He was left with akinesis of his entire anterior apical myocardium and he had significant damage from his infarct and went home with a life fast and after 3 months of wearing a life fast his left ventricular function was still low and he underwent implantation of a defibrillator on 11/10/10 by Dr. Ladona Ridgel.  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  Current Outpatient Prescriptions  Medication Sig Dispense Refill  . aspirin 81 MG tablet Take 81 mg by mouth daily.        [name] atorvastatin (LIPITOR) 80 MG tablet Take 1 tablet (80 mg total) by mouth daily.  30 tablet  11  . carvedilol (COREG) 3.125 MG tablet Take 1 tablet (3.125 mg total) by mouth 2 (two) times daily with a meal.  60 tablet  11  . EFFIENT 10 MG TABS TAKE ONE TABLET BY MOUTH DAILY  30 each  5  . furosemide (LASIX) 40 MG tablet One daily as needed  30 tablet  11  . lisinopril (PRINIVIL,ZESTRIL) 5 MG tablet Take 1 tablet (5 mg total) by mouth daily.  30 tablet  11  . nitroGLYCERIN (NITROSTAT) 0.4 MG SL tablet Place 0.4 mg under the tongue every 5 (five) minutes as needed.        [name] spironolactone (ALDACTONE) 25 MG tablet 1/2 tablet daily  30 tablet  11  . therapeutic multivitamin-minerals (THERAGRAN-M) tablet Take 1 tablet by mouth daily.        [name] omeprazole (PRILOSEC) 40 MG capsule Take 1 capsule (40  mg total) by mouth daily.  30 capsule  11    No Known Allergies  Patient Active Problem List  Diagnoses  . Coronary artery disease  . Hyperlipidemia  . Ventricular dysfunction  . Dyslipidemia  . Hypertension  . Ischemic cardiomyopathy  . Chronic systolic heart failure  . ICD (implantable cardiac defibrillator), dual, in situ    History  Smoking status  . Former Smoker  . Types: Cigarettes  Smokeless tobacco  . Not on file    History  Alcohol Use No    Family History  Problem Relation Age of Onset  . Heart disease Neg Hx     Review of Systems: Constitutional: no fever chills diaphoresis or fatigue or change in weight.  Head and neck: no hearing loss, no epistaxis, no photophobia or visual disturbance. Respiratory: No cough, shortness of breath or wheezing. Cardiovascular: No chest pain peripheral edema, palpitations. Gastrointestinal: No abdominal distention, no abdominal pain, no change in bowel habits hematochezia or melena. Genitourinary: No dysuria, no frequency, no urgency, no nocturia. Musculoskeletal:No arthralgias, no back pain, no gait disturbance or myalgias. Neurological: No dizziness, no headaches, no numbness, no seizures, no syncope, no weakness, no tremors. Hematologic: No lymphadenopathy, no easy bruising. Psychiatric: No confusion, no hallucinations, no sleep disturbance.    Physical Exam:  Filed Vitals:   08/14/11 1110  BP: 110/78  Pulse: 82   the general appearance reveals a well-developed well-nourished middle-aged gentleman in no distress.Pupils equal and reactive.   Extraocular Movements are full.  There is no scleral icterus.  The mouth and pharynx are normal.  The neck is supple.  The carotids reveal no bruits.  The jugular venous pressure is normal.  The thyroid is not enlarged.  There is no lymphadenopathy.  The chest is clear to percussion and auscultation. There are no rales or rhonchi. Expansion of the chest is symmetrical.  The  precordium is quiet.  The first heart sound is normal.  The second heart sound is physiologically split.  There is no murmur gallop rub or click.  There is no abnormal lift or heave.  The abdomen is obese nontender without hepatosplenomegaly or masses.The pedal pulses are good.  There is no phlebitis or edema.  There is no cyanosis or clubbing. Strength is normal and symmetrical in all extremities.  There is no lateralizing weakness.  There are no sensory deficits.  The skin is warm and dry.  There is no rash.  EKG today shows sinus rhythm and a pattern of an old anteroseptal myocardial infarction.  The previous deep T-wave inversions have improved.   Assessment / Plan: The patient is to continue same medication.  We are adding empiric omeprazole 40 mg one daily.  He is on Effient and not on Plavix.  He'll be rechecked in 4 months for followup office visit and fasting lab work.

## 2011-08-14 NOTE — Assessment & Plan Note (Signed)
The patient has a history of dyslipidemia.  He is on Lipitor 80 mg daily.  He has not been having any myalgias from Lipitor.

## 2011-08-14 NOTE — Assessment & Plan Note (Signed)
The patient has been experiencing dyspepsia.  He has been gaining weight.  For eating he will frequently have nausea.  This can occur anytime during the day.  Postprandially he has a lot of belching and has also been experiencing acid reflux.  He does not have any history of cholelithiasis.  We will give him a trial of omeprazole 40 mg one daily and see if it helps his symptoms.  He will call us if he fails to respond

## 2011-08-29 ENCOUNTER — Telehealth: Payer: Self-pay | Admitting: Cardiology

## 2011-08-29 NOTE — Telephone Encounter (Signed)
Card placed up front with 2 weeks worth of samples

## 2011-08-29 NOTE — Telephone Encounter (Signed)
Pt's wife calling re a card pt was given for effient  but only for free 30 day supply,  wants to know if you have any free copay cards instead, pls call

## 2011-10-04 ENCOUNTER — Telehealth: Payer: Self-pay | Admitting: Cardiology

## 2011-10-04 NOTE — Telephone Encounter (Signed)
Please return call to patient wife [name] [phone] regarding co-pay card for Effient Medication.

## 2011-10-04 NOTE — Telephone Encounter (Signed)
Spoke with wife, no cards at this time.  Will keep looking to see if any in before his expires 10/20/11

## 2011-11-16 ENCOUNTER — Encounter: Payer: Self-pay | Admitting: Internal Medicine

## 2011-11-16 ENCOUNTER — Ambulatory Visit (INDEPENDENT_AMBULATORY_CARE_PROVIDER_SITE_OTHER): Payer: BC Managed Care – PPO | Admitting: *Deleted

## 2011-11-16 DIAGNOSIS — I255 Ischemic cardiomyopathy: Secondary | ICD-10-CM

## 2011-11-16 DIAGNOSIS — Z95810 Presence of automatic (implantable) cardiac defibrillator: Secondary | ICD-10-CM

## 2011-11-16 DIAGNOSIS — I2589 Other forms of chronic ischemic heart disease: Secondary | ICD-10-CM

## 2011-11-20 LAB — REMOTE ICD DEVICE
DEVICE MODEL ICD: 819170
LV LEAD IMPEDENCE ICD: 82 Ohm

## 2011-12-13 ENCOUNTER — Other Ambulatory Visit: Payer: Self-pay | Admitting: Cardiovascular Disease

## 2011-12-15 ENCOUNTER — Encounter: Payer: Self-pay | Admitting: *Deleted

## 2011-12-29 ENCOUNTER — Encounter: Payer: Self-pay | Admitting: Cardiology

## 2011-12-29 ENCOUNTER — Ambulatory Visit (INDEPENDENT_AMBULATORY_CARE_PROVIDER_SITE_OTHER): Payer: BC Managed Care – PPO | Admitting: Cardiology

## 2011-12-29 VITALS — BP 110/80 | HR 82 | Ht 66.0 in | Wt 190.0 lb

## 2011-12-29 DIAGNOSIS — I2510 Atherosclerotic heart disease of native coronary artery without angina pectoris: Secondary | ICD-10-CM

## 2011-12-29 DIAGNOSIS — R5381 Other malaise: Secondary | ICD-10-CM | POA: Insufficient documentation

## 2011-12-29 DIAGNOSIS — Z95810 Presence of automatic (implantable) cardiac defibrillator: Secondary | ICD-10-CM

## 2011-12-29 DIAGNOSIS — R5383 Other fatigue: Secondary | ICD-10-CM | POA: Insufficient documentation

## 2011-12-29 NOTE — Assessment & Plan Note (Signed)
Patient complains of no energy.  Some of this may be from his ischemic cardiomyopathy.  However also the patient is becoming more obese.  He has gained 6 pounds since last visit.  He is not getting any exercise and is sedentary.  I have changed him to cut back on calories and to lose weight and to try to be more physically active.  I think this would help his malaise and fatigue.  We are going to have him return next week for blood work and we will include a CBC and a TSH to be sure that we are not overlooking other causes of malaise and fatigue

## 2011-12-29 NOTE — Assessment & Plan Note (Signed)
The patient has not been aware of any shocks from his defibrillator. 

## 2011-12-29 NOTE — Patient Instructions (Addendum)
Come back next week for fasting labs  Your physician recommends that you continue on your current medications as directed. Please refer to the Current Medication list given to you today.  Your physician recommends that you schedule a follow-up appointment in: 4 months with fasting labs (lp/bmet/hfp) and ekg

## 2011-12-29 NOTE — Assessment & Plan Note (Signed)
The patient has not been experiencing any recurrent angina pectoris. 

## 2011-12-29 NOTE — Progress Notes (Signed)
Spencer Horn Date of Birth:  07/05/1950 [facility] [address] [place], [name]  [address] [phone]         Fax   [phone]  History of Present Illness: This pleasant 61 year old gentleman is seen for a scheduled four-month followup office visit.  He has a history of severe ischemic heart disease.  He has an ischemic cardiomyopathy.  He had his first heart attack in 2004.  In 2011 he had a normal treadmill Cardiolite stress test.  In February 2012 he had an acute anterior wall myocardial infarction treated with emergency cardiac catheterization and a drug-eluting stent.  He was left with akinesis of his entire anterior apical myocardium and after wearing a life chest went on to have the implantation of a defibrillator on 11/10/10.  He is unable to work and is retired on disability.  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  Current Outpatient Prescriptions  Medication Sig Dispense Refill  . aspirin 81 MG tablet Take 81 mg by mouth daily.        [name] atorvastatin (LIPITOR) 80 MG tablet Take 1 tablet (80 mg total) by mouth daily.  30 tablet  11  . carvedilol (COREG) 3.125 MG tablet Take 1 tablet (3.125 mg total) by mouth 2 (two) times daily with a meal.  60 tablet  11  . EFFIENT 10 MG TABS TAKE ONE TABLET BY MOUTH DAILY  30 each  9  . lisinopril (PRINIVIL,ZESTRIL) 5 MG tablet Take 1 tablet (5 mg total) by mouth daily.  30 tablet  11  . Multiple Vitamin (MULTIVITAMIN) tablet Take 1 tablet by mouth daily.      . nitroGLYCERIN (NITROSTAT) 0.4 MG SL tablet Place 0.4 mg under the tongue every 5 (five) minutes as needed.        [name] spironolactone (ALDACTONE) 25 MG tablet 1/2 tablet daily  30 tablet  11  . therapeutic multivitamin-minerals (THERAGRAN-M) tablet Take 1 tablet by mouth daily.          No Known Allergies  Patient Active Problem List  Diagnosis  . Coronary artery disease  . Hyperlipidemia  . Ventricular dysfunction  . Dyslipidemia  .  Hypertension  . Ischemic cardiomyopathy  . Chronic systolic heart failure  . ICD (implantable cardiac defibrillator), dual, in situ  . Dyspepsia    History  Smoking status  . Former Smoker  . Types: Cigarettes  Smokeless tobacco  . Not on file    History  Alcohol Use No    Family History  Problem Relation Age of Onset  . Heart disease Neg Hx     Review of Systems: Constitutional: no fever chills diaphoresis or fatigue or change in weight.  Head and neck: no hearing loss, no epistaxis, no photophobia or visual disturbance. Respiratory: No cough, shortness of breath or wheezing. Cardiovascular: No chest pain peripheral edema, palpitations. Gastrointestinal: No abdominal distention, no abdominal pain, no change in bowel habits hematochezia or melena. Genitourinary: No dysuria, no frequency, no urgency, no nocturia. Musculoskeletal:No arthralgias, no back pain, no gait disturbance or myalgias. Neurological: No dizziness, no headaches, no numbness, no seizures, no syncope, no weakness, no tremors. Hematologic: No lymphadenopathy, no easy bruising. Psychiatric: No confusion, no hallucinations, no sleep disturbance.    Physical Exam: Filed Vitals:   12/29/11 1639  BP: 110/80  Pulse: 82   the general appearance reveals a well-developed well-nourished mildly obese gentleman in no distress.The head and neck exam reveals pupils equal and  reactive.  Extraocular movements are full.  There is no scleral icterus.  The mouth and pharynx are normal.  The neck is supple.  The carotids reveal no bruits.  The jugular venous pressure is normal.  The  thyroid is not enlarged.  There is no lymphadenopathy.  There is a defibrillator in the left upper chest  The chest is clear to percussion and auscultation.  There are no rales or rhonchi.  Expansion of the chest is symmetrical.  The precordium is quiet.  The first heart sound is normal.  The second heart sound is physiologically split.  There is  no murmur gallop rub or click.  There is no abnormal lift or heave.  The abdomen is soft and nontender.  The bowel sounds are normal.  The liver and spleen are not enlarged.  There are no abdominal masses.  There are no abdominal bruits.  Extremities reveal good pedal pulses.  There is no phlebitis or edema.  There is no cyanosis or clubbing.  Strength is normal and symmetrical in all extremities.  There is no lateralizing weakness.  There are no sensory deficits.  The skin is warm and dry.  There is no rash.     Assessment / Plan: Continue on same medication but work harder on weight loss and increasing exercise.  Return next week for lab work since he did not get it today.  Check CBC and TSH also.  Recheck in 4 months for office visit EKG and fasting lipid panel hepatic function panel and basal metabolic panel

## 2012-01-04 ENCOUNTER — Other Ambulatory Visit (INDEPENDENT_AMBULATORY_CARE_PROVIDER_SITE_OTHER): Payer: BC Managed Care – PPO

## 2012-01-04 DIAGNOSIS — E7800 Pure hypercholesterolemia, unspecified: Secondary | ICD-10-CM

## 2012-01-04 DIAGNOSIS — I119 Hypertensive heart disease without heart failure: Secondary | ICD-10-CM

## 2012-01-04 LAB — BASIC METABOLIC PANEL
Calcium: 9 mg/dL (ref 8.4–10.5)
Creatinine, Ser: 0.9 mg/dL (ref 0.4–1.5)
GFR: 87.14 mL/min (ref >60.00)
Sodium: 135 mEq/L (ref 135–145)

## 2012-01-04 LAB — HEPATIC FUNCTION PANEL
ALT: 23 U/L (ref 0–53)
AST: 26 U/L (ref 0–37)
Albumin: 4 g/dL (ref 3.5–5.2)
Alkaline Phosphatase: 76 U/L (ref 39–117)
Total Protein: 7.4 g/dL (ref 6.0–8.3)

## 2012-01-04 LAB — LIPID PANEL
Cholesterol: 90 mg/dL (ref 0–200)
HDL: 33.9 mg/dL — ABNORMAL LOW (ref >39.00)
Triglycerides: 108 mg/dL (ref 0.0–149.0)

## 2012-01-04 NOTE — Progress Notes (Signed)
Quick Note:  Please report to patient. The recent labs are stable. Continue same medication and careful diet. His total cholesterol is only 90 now so he can reduce his Lipitor to 40 mg daily ______

## 2012-01-09 ENCOUNTER — Telehealth: Payer: Self-pay | Admitting: Cardiology

## 2012-01-09 NOTE — Telephone Encounter (Signed)
Message copied by Burnell Blanks on Tue Jan 09, 2012  2:08 PM ------      Message from: Cassell Clement      Created: Thu Jan 04, 2012  3:32 PM       Please report to patient.  The recent labs are stable. Continue same medication and careful diet.  His total cholesterol is only 90 now so he can reduce his Lipitor to 40 mg daily

## 2012-01-09 NOTE — Telephone Encounter (Signed)
Fu call °Pt returning your call about lab tests °

## 2012-01-19 ENCOUNTER — Other Ambulatory Visit: Payer: Self-pay | Admitting: Cardiology

## 2012-02-03 ENCOUNTER — Other Ambulatory Visit: Payer: Self-pay | Admitting: Cardiology

## 2012-02-05 NOTE — Telephone Encounter (Signed)
Refilled lisinopril

## 2012-02-10 ENCOUNTER — Other Ambulatory Visit: Payer: Self-pay | Admitting: Cardiology

## 2012-02-19 NOTE — Telephone Encounter (Signed)
Patient wife was advised on 8/20 of labs

## 2012-02-27 ENCOUNTER — Encounter: Payer: Self-pay | Admitting: *Deleted

## 2012-03-05 ENCOUNTER — Encounter: Payer: Self-pay | Admitting: Internal Medicine

## 2012-03-05 ENCOUNTER — Ambulatory Visit (INDEPENDENT_AMBULATORY_CARE_PROVIDER_SITE_OTHER): Payer: BC Managed Care – PPO | Admitting: Internal Medicine

## 2012-03-05 VITALS — BP 106/74 | Ht 66.0 in | Wt 185.4 lb

## 2012-03-05 DIAGNOSIS — I5022 Chronic systolic (congestive) heart failure: Secondary | ICD-10-CM

## 2012-03-05 DIAGNOSIS — Z95810 Presence of automatic (implantable) cardiac defibrillator: Secondary | ICD-10-CM

## 2012-03-05 DIAGNOSIS — I2589 Other forms of chronic ischemic heart disease: Secondary | ICD-10-CM

## 2012-03-05 LAB — ICD DEVICE OBSERVATION
BRDY-0002RV: 40 {beats}/min
DEV-0020ICD: NEGATIVE
DEVICE MODEL ICD: 819170
HV IMPEDENCE: 87 Ohm
PACEART VT: 0
TOT-0007: 3
TOT-0010: 5
VENTRICULAR PACING ICD: 0 pct
VF: 0

## 2012-03-05 NOTE — Assessment & Plan Note (Signed)
His chronic systolic heart failure remains class II. He will continue his current medical therapy. I've encouraged the patient to increase his physical activity. A low-sodium diet is recommended.

## 2012-03-05 NOTE — Patient Instructions (Signed)
Your physician wants you to follow-up in: 6 months with Dr Court Joy will receive a reminder letter in the mail two months in advance. If you don't receive a letter, please call our office to schedule the follow-up appointment.   Remote monitoring is used to monitor your Pacemaker of ICD from home. This monitoring reduces the number of office visits required to check your device to one time per year. It allows [place] to keep an eye on the functioning of your device to ensure it is working properly. You are scheduled for a device check from home on 06/10/12. You may send your transmission at any time that day. If you have a wireless device, the transmission will be sent automatically. After your physician reviews your transmission, you will receive a postcard with your next transmission date.

## 2012-03-05 NOTE — Assessment & Plan Note (Signed)
His St. Jude single chamber defibrillator is working normally. We'll plan to recheck in several months. 

## 2012-03-05 NOTE — Progress Notes (Signed)
HPI Spencer Horn returns for followup. He is a pleasant 61 year old man with chronic ischemic cardiomyopathy, chronic systolic heart failure, status post ICD implantation. In the interim, he denies chest pain, or syncope. He has class II heart failure symptoms. No peripheral edema. No recent ICD shock.  No Known Allergies   Current Outpatient Prescriptions  Medication Sig Dispense Refill  . aspirin 81 MG tablet Take 81 mg by mouth daily.        [name] atorvastatin (LIPITOR) 80 MG tablet       . carvedilol (COREG) 3.125 MG tablet TAKE ONE TABLET BY MOUTH TWICE DAILY WITH  MEALS  60 tablet  9  . EFFIENT 10 MG TABS TAKE ONE TABLET BY MOUTH DAILY  30 each  9  . lisinopril (PRINIVIL,ZESTRIL) 5 MG tablet TAKE ONE TABLET (5 MG TOTAL) BY MOUTH EVERY DAY  30 tablet  10  . Multiple Vitamin (MULTIVITAMIN) tablet Take 1 tablet by mouth daily.      . nitroGLYCERIN (NITROSTAT) 0.4 MG SL tablet Place 0.4 mg under the tongue every 5 (five) minutes as needed.        [name] spironolactone (ALDACTONE) 25 MG tablet 1/2 tablet daily  30 tablet  11  . DISCONTD: atorvastatin (LIPITOR) 80 MG tablet TAKE ONE TABLET BY MOUTH EVERY DAY  30 tablet  10     Past Medical History  Diagnosis Date  . History of acute anterior wall MI   . Dyslipidemia   . Ventricular dysfunction 07/07/2010    Severe left ventricular dysfunction with ejection fraction of 30-35%  . Personal history of MRSA (methicillin resistant Staphylococcus aureus)   . Hyperlipidemia   . Coronary artery disease   . Ischemic cardiomyopathy   . Tobacco abuse   . ICD (implantable cardiac defibrillator) in place June 2012    ROS:   All systems reviewed and negative except as noted in the HPI.   Past Surgical History  Procedure Date  . Cardiac catheterization 07/07/2010    DES to proximal LAD  . Cardiac catheterization 2004  . Icd implant June 2012     Family History  Problem Relation Age of Onset  . Heart disease Neg Hx      History   Social  History  . Marital Status: Single    Spouse Name: N/A    Number of Children: N/A  . Years of Education: N/A   Occupational History  . Not on file.   Social History Main Topics  . Smoking status: Former Smoker    Types: Cigarettes  . Smokeless tobacco: Not on file  . Alcohol Use: No  . Drug Use: No  . Sexually Active: Yes   Other Topics Concern  . Not on file   Social History Narrative  . No narrative on file     BP 106/74  Ht 5\' 6"  (1.676 m)  Wt 185 lb 6.4 oz (84.097 kg)  BMI 29.92 kg/m2  Physical Exam:  Well appearing  61 year old man, NAD HEENT: Unremarkable Neck:  No JVD, no thyromegally Lungs:  Clear with no wheezes, rales, or rhonchi.  HEART:  Regular rate rhythm, no murmurs, no rubs, no clicks Abd:  soft, positive bowel sounds, no organomegally, no rebound, no guarding Ext:  2 plus pulses, no edema, no cyanosis, no clubbing Skin:  No rashes no nodules Neuro:  CN II through XII intact, motor grossly intact  EKG  normal sinus rhythm with prior anteroseptal MI  DEVICE  Normal device function.  See  PaceArt for details.   Assess/Plan:

## 2012-03-13 ENCOUNTER — Other Ambulatory Visit: Payer: Self-pay | Admitting: Cardiology

## 2012-03-15 ENCOUNTER — Telehealth: Payer: Self-pay | Admitting: Cardiology

## 2012-03-15 NOTE — Telephone Encounter (Signed)
New problem:    Has appt on Wednesday 10/30. Does  He need to be pre- med before dental appt.

## 2012-03-15 NOTE — Telephone Encounter (Signed)
Patient's wife called because pt needs to have a dental appointment for an infection on his lower Jaw under his  Partial denture. According to wife pt's dentist would like to have it in writing  That she can treat pt for this issue and does not need pre-med's prior dental appointment. Pt does not have a diagnosis requiring pre-med's prior dental work. It is fine to treat Pt.

## 2012-04-14 IMAGING — CR DG CHEST 1V PORT
1 series · 1 of 1 positions shown · non-contrast
Comparison: 07/14/2009.

CLINICAL DATA: Smoker.  Status post cardiac catheterization and
STEMI.

PORTABLE CHEST - 1 VIEW

[AP]
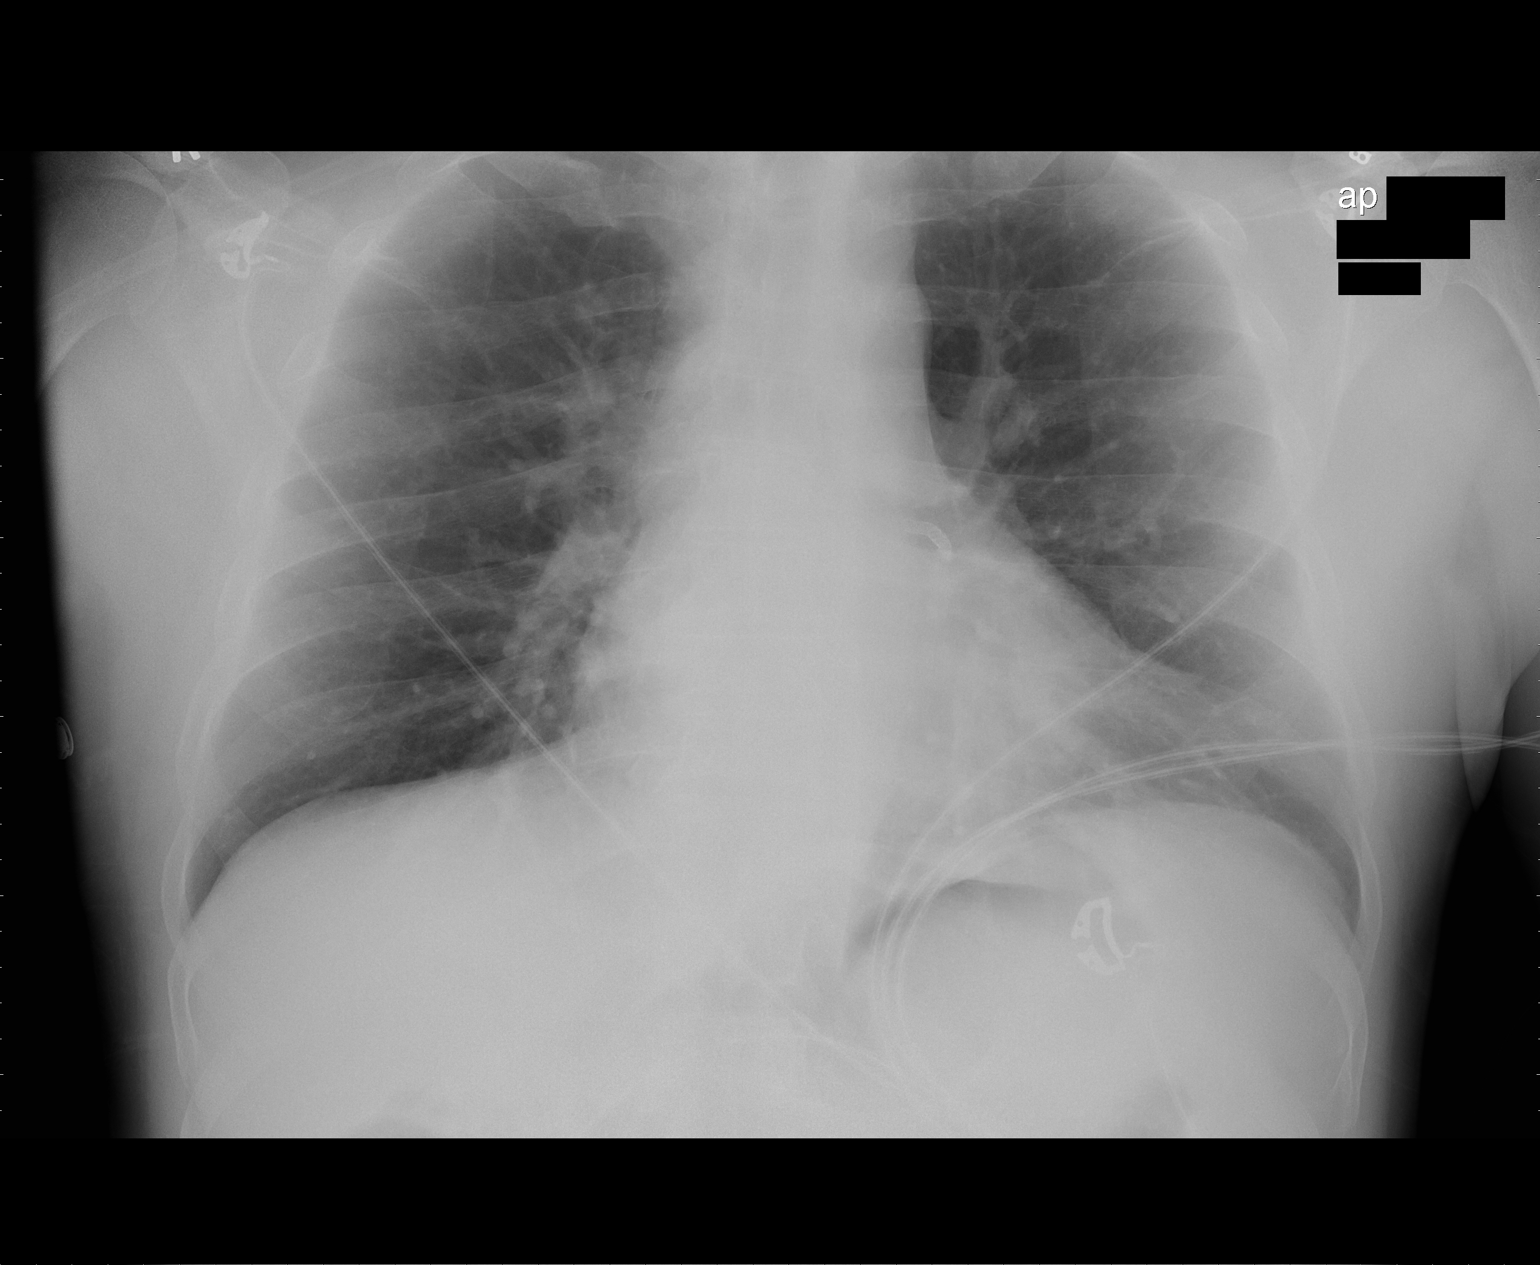

[1 of 1 positions shown; findings below may reference images not displayed]

FINDINGS: The cardiac silhouette remains borderline enlarged.
Clear lungs with normal vascularity.  Longer and denser left
coronary artery stent.  Unremarkable bones.
IMPRESSION: No acute abnormality.

## 2012-05-03 ENCOUNTER — Other Ambulatory Visit (INDEPENDENT_AMBULATORY_CARE_PROVIDER_SITE_OTHER): Payer: BC Managed Care – PPO

## 2012-05-03 DIAGNOSIS — I2510 Atherosclerotic heart disease of native coronary artery without angina pectoris: Secondary | ICD-10-CM

## 2012-05-03 LAB — HEPATIC FUNCTION PANEL
Alkaline Phosphatase: 76 U/L (ref 39–117)
Bilirubin, Direct: 0.1 mg/dL (ref 0.0–0.3)
Total Bilirubin: 0.7 mg/dL (ref 0.3–1.2)

## 2012-05-03 LAB — LIPID PANEL
HDL: 27.8 mg/dL — ABNORMAL LOW (ref >39.00)
LDL Cholesterol: 50 mg/dL (ref 0–99)
Total CHOL/HDL Ratio: 4
Triglycerides: 124 mg/dL (ref 0.0–149.0)
VLDL: 24.8 mg/dL (ref 0.0–40.0)

## 2012-05-03 LAB — BASIC METABOLIC PANEL
CO2: 25 mEq/L (ref 19–32)
Calcium: 8.8 mg/dL (ref 8.4–10.5)
Creatinine, Ser: 1 mg/dL (ref 0.4–1.5)
GFR: 81.05 mL/min (ref >60.00)
Sodium: 130 mEq/L — ABNORMAL LOW (ref 135–145)

## 2012-05-05 NOTE — Progress Notes (Signed)
Quick Note:  Please make copy of labs for patient visit. ______ 

## 2012-05-07 ENCOUNTER — Ambulatory Visit (INDEPENDENT_AMBULATORY_CARE_PROVIDER_SITE_OTHER): Payer: BC Managed Care – PPO | Admitting: Cardiology

## 2012-05-07 ENCOUNTER — Ambulatory Visit
Admission: RE | Admit: 2012-05-07 | Discharge: 2012-05-07 | Disposition: A | Discharge status: Home / Self Care | Payer: BC Managed Care – PPO | Source: Ambulatory Visit | Attending: Cardiology | Admitting: Cardiology

## 2012-05-07 ENCOUNTER — Other Ambulatory Visit: Payer: BC Managed Care – PPO

## 2012-05-07 ENCOUNTER — Encounter: Payer: Self-pay | Admitting: Cardiology

## 2012-05-07 VITALS — BP 115/85 | HR 75 | Ht 66.0 in | Wt 190.0 lb

## 2012-05-07 DIAGNOSIS — R05 Cough: Secondary | ICD-10-CM

## 2012-05-07 DIAGNOSIS — I255 Ischemic cardiomyopathy: Secondary | ICD-10-CM

## 2012-05-07 DIAGNOSIS — I2589 Other forms of chronic ischemic heart disease: Secondary | ICD-10-CM

## 2012-05-07 DIAGNOSIS — E7800 Pure hypercholesterolemia, unspecified: Secondary | ICD-10-CM

## 2012-05-07 DIAGNOSIS — I2510 Atherosclerotic heart disease of native coronary artery without angina pectoris: Secondary | ICD-10-CM

## 2012-05-07 MED ORDER — ATORVASTATIN CALCIUM 40 MG PO TABS: 40.0000 mg | ORAL_TABLET | Freq: Every day | ORAL | Status: DC

## 2012-05-07 MED ORDER — LOSARTAN POTASSIUM 25 MG PO TABS: 25.0000 mg | ORAL_TABLET | Freq: Every day | ORAL | Status: DC

## 2012-05-07 NOTE — Progress Notes (Signed)
Spencer Horn Date of Birth:  08-12-1950 [facility] [address] [place], [name]  [address] [phone]         Fax   [phone]  History of Present Illness: This pleasant 61 year old gentleman is seen for a scheduled four-month followup office visit. He has a history of severe ischemic heart disease. He has an ischemic cardiomyopathy. He had his first heart attack in 2004. In 2011 he had a normal treadmill Cardiolite stress test. In February 2012 he had an acute anterior wall myocardial infarction treated with emergency cardiac catheterization and a drug-eluting stent. He was left with akinesis of his entire anterior apical myocardium and after wearing a life chest went on to have the implantation of a defibrillator on 11/10/10. He is unable to work and is retired on disability. 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    Current Outpatient Prescriptions  Medication Sig Dispense Refill  . aspirin 81 MG tablet Take 81 mg by mouth daily.        [name] atorvastatin (LIPITOR) 40 MG tablet Take 1 tablet (40 mg total) by mouth daily.  90 tablet  3  . carvedilol (COREG) 3.125 MG tablet TAKE ONE TABLET BY MOUTH TWICE DAILY WITH  MEALS  60 tablet  9  . EFFIENT 10 MG TABS TAKE ONE TABLET BY MOUTH DAILY  30 each  9  . Multiple Vitamin (MULTIVITAMIN) tablet Take 1 tablet by mouth daily.      . nitroGLYCERIN (NITROSTAT) 0.4 MG SL tablet Place 0.4 mg under the tongue every 5 (five) minutes as needed.        [name] spironolactone (ALDACTONE) 25 MG tablet TAKE ONE-HALF TABLET BY MOUTH EVERY DAY  30 tablet  10  . losartan (COZAAR) 25 MG tablet Take 1 tablet (25 mg total) by mouth daily.  90 tablet  3    No Known Allergies  Patient Active Problem List  Diagnosis  . Coronary artery disease  . Hyperlipidemia  . Ventricular dysfunction  . Dyslipidemia  . Hypertension  . Ischemic cardiomyopathy  . Chronic  systolic heart failure  . ICD-St.Jude  . Dyspepsia  . Malaise and fatigue  . Cough    History  Smoking status  . Former Smoker  . Types: Cigarettes  Smokeless tobacco  . Not on file    History  Alcohol Use No    Family History  Problem Relation Age of Onset  . Heart disease Neg Hx     Review of Systems: Constitutional: no fever chills diaphoresis or fatigue or change in weight.  Head and neck: no hearing loss, no epistaxis, no photophobia or visual disturbance. Respiratory: No cough, shortness of breath or wheezing. Cardiovascular: No chest pain peripheral edema, palpitations. Gastrointestinal: No abdominal distention, no abdominal pain, no change in bowel habits hematochezia or melena. Genitourinary: No dysuria, no frequency, no urgency, no nocturia. Musculoskeletal:No arthralgias, no back pain, no gait disturbance or myalgias. Neurological: No dizziness, no headaches, no numbness, no seizures, no syncope, no weakness, no tremors. Hematologic: No lymphadenopathy, no easy bruising. Psychiatric: No confusion, no hallucinations, no sleep disturbance.    Physical Exam: Filed Vitals:   05/07/12 0912  BP: 115/85  Pulse: 75     Assessment / Plan:       Current Outpatient Prescriptions  Medication Sig Dispense Refill  . aspirin 81 MG tablet Take 81 mg by mouth daily.        [name]  atorvastatin (LIPITOR) 40 MG tablet Take 1 tablet (40 mg total) by mouth daily.  90 tablet  3  . carvedilol (COREG) 3.125 MG tablet TAKE ONE TABLET BY MOUTH TWICE DAILY WITH  MEALS  60 tablet  9  . EFFIENT 10 MG TABS TAKE ONE TABLET BY MOUTH DAILY  30 each  9  . Multiple Vitamin (MULTIVITAMIN) tablet Take 1 tablet by mouth daily.      . nitroGLYCERIN (NITROSTAT) 0.4 MG SL tablet Place 0.4 mg under the tongue every 5 (five) minutes as needed.        [name] spironolactone (ALDACTONE) 25 MG tablet TAKE ONE-HALF TABLET BY MOUTH EVERY DAY  30 tablet  10  . losartan (COZAAR) 25 MG tablet Take 1 tablet  (25 mg total) by mouth daily.  90 tablet  3    No Known Allergies  Patient Active Problem List  Diagnosis  . Coronary artery disease  . Hyperlipidemia  . Ventricular dysfunction  . Dyslipidemia  . Hypertension  . Ischemic cardiomyopathy  . Chronic systolic heart failure  . ICD-St.Jude  . Dyspepsia  . Malaise and fatigue  . Cough    History  Smoking status  . Former Smoker  . Types: Cigarettes  Smokeless tobacco  . Not on file    History  Alcohol Use No    Family History  Problem Relation Age of Onset  . Heart disease Neg Hx     Review of Systems: Constitutional: no fever chills diaphoresis or fatigue or change in weight.  Head and neck: no hearing loss, no epistaxis, no photophobia or visual disturbance. Respiratory: No cough, shortness of breath or wheezing. Cardiovascular: No chest pain peripheral edema, palpitations. Gastrointestinal: No abdominal distention, no abdominal pain, no change in bowel habits hematochezia or melena. Genitourinary: No dysuria, no frequency, no urgency, no nocturia. Musculoskeletal:No arthralgias, no back pain, no gait disturbance or myalgias. Neurological: No dizziness, no headaches, no numbness, no seizures, no syncope, no weakness, no tremors. Hematologic: No lymphadenopathy, no easy bruising. Psychiatric: No confusion, no hallucinations, no sleep disturbance.    Physical Exam: Filed Vitals:   05/07/12 0912  BP: 115/85  Pulse: 75   General appearance reveals a well-developed well-nourished middle-aged gentleman in no distress.The head and neck exam reveals pupils equal and reactive.  Extraocular movements are full.  There is no scleral icterus.  The mouth and pharynx are normal.  The neck is supple.  The carotids reveal no bruits.  The jugular venous pressure is normal.  The  thyroid is not enlarged.  There is no lymphadenopathy.  The chest is clear to percussion and auscultation.  There are no rales or rhonchi.  Expansion of  the chest is symmetrical.  The precordium is quiet.  The first heart sound is normal.  The second heart sound is physiologically split.  There is no murmur gallop rub or click.  There is no abnormal lift or heave.  The abdomen is soft and nontender.  The bowel sounds are normal.  The liver and spleen are not enlarged.  There are no abdominal masses.  There are no abdominal bruits.  Extremities reveal good pedal pulses.  There is no phlebitis or edema.  There is no cyanosis or clubbing.  Strength is normal and symmetrical in all extremities.  There is no lateralizing weakness.  There are no sensory deficits.  The skin is warm and dry.  There is no rash.    Assessment / Plan: We are going to update his  chest x-[place]. His dry cough may be from lisinopril and we will stop it and switched to losartan Recheck in 4 months for followup office visit lipid panel hepatic function panel and basal metabolic panel

## 2012-05-07 NOTE — Assessment & Plan Note (Signed)
He has not been having any orthopnea or paroxysmal nocturnal dyspnea

## 2012-05-07 NOTE — Assessment & Plan Note (Signed)
The patient has been having any dry cough for several weeks.  He said no fever chills or purulent sputum.  Last chest x-[place] was 11/11/10 and showed cardiomegaly.

## 2012-05-07 NOTE — Patient Instructions (Addendum)
STOP LISINOPRIL AND START LOSARTAN 25 MG DAILY  WILL HAVE YOU GO FOR A CHEST XRAY TODAY  Your physician wants you to follow-up in: 4 months with fasting labs (lp/bmet/hfp)  You will receive a reminder letter in the mail two months in advance. If you don't receive a letter, please call our office to schedule the follow-up appointment.

## 2012-05-07 NOTE — Assessment & Plan Note (Signed)
He has not been experiencing any exertional chest pain or angina pectoris.  He does have poor exercise tolerance and easy fatigue

## 2012-05-08 ENCOUNTER — Telehealth: Payer: Self-pay | Admitting: *Deleted

## 2012-05-08 NOTE — Telephone Encounter (Signed)
Advised wife  

## 2012-05-08 NOTE — Telephone Encounter (Signed)
Message copied by Burnell Blanks on Wed May 08, 2012  6:23 PM ------      Message from: Cassell Clement      Created: Tue May 07, 2012  9:33 PM       Chest xray is stable. Lungs clear.

## 2012-06-10 ENCOUNTER — Ambulatory Visit (INDEPENDENT_AMBULATORY_CARE_PROVIDER_SITE_OTHER): Payer: BC Managed Care – PPO | Admitting: *Deleted

## 2012-06-10 DIAGNOSIS — I255 Ischemic cardiomyopathy: Secondary | ICD-10-CM

## 2012-06-10 DIAGNOSIS — I2589 Other forms of chronic ischemic heart disease: Secondary | ICD-10-CM

## 2012-06-10 DIAGNOSIS — Z95810 Presence of automatic (implantable) cardiac defibrillator: Secondary | ICD-10-CM

## 2012-06-11 LAB — REMOTE ICD DEVICE
DEVICE MODEL ICD: 819170
RV LEAD AMPLITUDE: 12 mv
VENTRICULAR PACING ICD: 0 pct

## 2012-06-20 ENCOUNTER — Encounter: Payer: Self-pay | Admitting: *Deleted

## 2012-06-27 ENCOUNTER — Encounter: Payer: Self-pay | Admitting: Internal Medicine

## 2012-07-06 ENCOUNTER — Other Ambulatory Visit: Payer: Self-pay

## 2012-07-17 ENCOUNTER — Other Ambulatory Visit: Payer: Self-pay | Admitting: Internal Medicine

## 2012-07-17 ENCOUNTER — Encounter (INDEPENDENT_AMBULATORY_CARE_PROVIDER_SITE_OTHER): Payer: BC Managed Care – PPO

## 2012-07-17 ENCOUNTER — Ambulatory Visit (INDEPENDENT_AMBULATORY_CARE_PROVIDER_SITE_OTHER): Payer: BC Managed Care – PPO | Admitting: *Deleted

## 2012-07-17 DIAGNOSIS — I519 Heart disease, unspecified: Secondary | ICD-10-CM

## 2012-07-17 LAB — ICD DEVICE OBSERVATION
CHARGE TIME: 9 s
DEV-0020ICD: NEGATIVE
PACEART VT: 0
RV LEAD IMPEDENCE ICD: 437.5 Ohm
RV LEAD THRESHOLD: 1 V
TOT-0008: 0
TOT-0009: 2
TOT-0010: 7

## 2012-07-17 NOTE — Progress Notes (Signed)
defib check in clinic  

## 2012-08-05 ENCOUNTER — Encounter: Payer: Self-pay | Admitting: Internal Medicine

## 2012-09-02 ENCOUNTER — Other Ambulatory Visit (INDEPENDENT_AMBULATORY_CARE_PROVIDER_SITE_OTHER): Payer: BC Managed Care – PPO

## 2012-09-02 DIAGNOSIS — E7800 Pure hypercholesterolemia, unspecified: Secondary | ICD-10-CM

## 2012-09-02 LAB — LIPID PANEL
HDL: 28.4 mg/dL — ABNORMAL LOW (ref >39.00)
Total CHOL/HDL Ratio: 3
Triglycerides: 126 mg/dL (ref 0.0–149.0)
VLDL: 25.2 mg/dL (ref 0.0–40.0)

## 2012-09-02 LAB — BASIC METABOLIC PANEL
CO2: 26 mEq/L (ref 19–32)
Calcium: 8.9 mg/dL (ref 8.4–10.5)
GFR: 77.37 mL/min (ref >60.00)
Potassium: 4.4 mEq/L (ref 3.5–5.1)
Sodium: 136 mEq/L (ref 135–145)

## 2012-09-02 LAB — HEPATIC FUNCTION PANEL
ALT: 17 U/L (ref 0–53)
AST: 24 U/L (ref 0–37)
Albumin: 3.8 g/dL (ref 3.5–5.2)

## 2012-09-02 NOTE — Progress Notes (Signed)
Quick Note:  Please make copy of labs for patient visit. ______ 

## 2012-09-04 ENCOUNTER — Ambulatory Visit (INDEPENDENT_AMBULATORY_CARE_PROVIDER_SITE_OTHER): Payer: BC Managed Care – PPO | Admitting: Cardiology

## 2012-09-04 ENCOUNTER — Encounter: Payer: Self-pay | Admitting: Cardiology

## 2012-09-04 VITALS — BP 120/78 | HR 81 | Ht 66.0 in | Wt 198.0 lb

## 2012-09-04 DIAGNOSIS — Z95810 Presence of automatic (implantable) cardiac defibrillator: Secondary | ICD-10-CM

## 2012-09-04 DIAGNOSIS — I2589 Other forms of chronic ischemic heart disease: Secondary | ICD-10-CM

## 2012-09-04 DIAGNOSIS — I2510 Atherosclerotic heart disease of native coronary artery without angina pectoris: Secondary | ICD-10-CM

## 2012-09-04 DIAGNOSIS — I519 Heart disease, unspecified: Secondary | ICD-10-CM

## 2012-09-04 DIAGNOSIS — I255 Ischemic cardiomyopathy: Secondary | ICD-10-CM

## 2012-09-04 NOTE — Assessment & Plan Note (Signed)
The patient has not had any recurrent chest pain or angina. 

## 2012-09-04 NOTE — Progress Notes (Signed)
Rosealee Albee Date of Birth:  [birth year] [facility] [address] [place], [name]  [address] [phone]         Fax   [phone]  History of Present Illness:  This pleasant 62 year old gentleman is seen for a scheduled four-month followup office visit. He has a history of severe ischemic heart disease. He has an ischemic cardiomyopathy. He had his first heart attack in 2004. In 2011 he had a normal treadmill Cardiolite stress test. In February 2012 he had an acute anterior wall myocardial infarction treated with emergency cardiac catheterization and a drug-eluting stent. He was left with akinesis of his entire anterior apical myocardium and after wearing a life chest went on to have the implantation of a defibrillator on 11/10/10.  He had an echocardiogram done prior to implantation of the defibrillator which showed an ejection fraction of only 35% on 10/06/10. He is unable to work and is retired on disability. 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  He has been eating too much and has gained 8 pounds since his last visit.  Current Outpatient Prescriptions  Medication Sig Dispense Refill  . aspirin 81 MG tablet Take 81 mg by mouth daily.        [name] atorvastatin (LIPITOR) 40 MG tablet Take 1 tablet (40 mg total) by mouth daily.  90 tablet  3  . carvedilol (COREG) 3.125 MG tablet TAKE ONE TABLET BY MOUTH TWICE DAILY WITH  MEALS  60 tablet  9  . EFFIENT 10 MG TABS TAKE ONE TABLET BY MOUTH DAILY  30 each  9  . losartan (COZAAR) 25 MG tablet Take 1 tablet (25 mg total) by mouth daily.  90 tablet  3  . Multiple Vitamin (MULTIVITAMIN) tablet Take 1 tablet by mouth daily.      . nitroGLYCERIN (NITROSTAT) 0.4 MG SL tablet Place 0.4 mg under the tongue every 5 (five) minutes as needed.        [name] spironolactone (ALDACTONE) 25 MG tablet TAKE ONE-HALF TABLET BY MOUTH EVERY DAY  30 tablet  10   No current  facility-administered medications for this visit.    No Known Allergies  Patient Active Problem List  Diagnosis  . Coronary artery disease  . Hyperlipidemia  . Ventricular dysfunction  . Dyslipidemia  . Hypertension  . Ischemic cardiomyopathy  . Chronic systolic heart failure  . ICD-St.Jude  . Dyspepsia  . Malaise and fatigue  . Cough    History  Smoking status  . Former Smoker  . Types: Cigarettes  Smokeless tobacco  . Not on file    History  Alcohol Use No    Family History  Problem Relation Age of Onset  . Heart disease Neg Hx     Review of Systems: Constitutional: no fever chills diaphoresis or fatigue or change in weight.  Head and neck: no hearing loss, no epistaxis, no photophobia or visual disturbance. Respiratory: No cough, shortness of breath or wheezing. Cardiovascular: No chest pain peripheral edema, palpitations. Gastrointestinal: No abdominal distention, no abdominal pain, no change in bowel habits hematochezia or melena. Genitourinary: No dysuria, no frequency, no urgency, no nocturia. Musculoskeletal:No arthralgias, no back pain, no gait disturbance or myalgias. Neurological: No dizziness, no headaches, no numbness, no seizures, no syncope, no weakness, no tremors. Hematologic: No lymphadenopathy, no easy bruising. Psychiatric: No confusion, no hallucinations, no sleep disturbance.    Physical Exam: Ceasar Mons  Vitals:   09/04/12 1134  BP: 120/78  Pulse: 81   the general appearance reveals a somewhat overweight gentleman in no distress.  Weight is up 8 pounds.The head and neck exam reveals pupils equal and reactive.  Extraocular movements are full.  There is no scleral icterus.  The mouth and pharynx are normal.  The neck is supple.  The carotids reveal no bruits.  The jugular venous pressure is normal.  The  thyroid is not enlarged.  There is no lymphadenopathy.  The chest is clear to percussion and auscultation.  There are no rales or rhonchi.   Expansion of the chest is symmetrical.  The precordium is quiet.  The first heart sound is normal.  The second heart sound is physiologically split.  There is no murmur gallop rub or click.  There is no abnormal lift or heave.  The abdomen is soft and nontender.  The bowel sounds are normal.  The liver and spleen are not enlarged.  There are no abdominal masses.  There are no abdominal bruits.  Extremities reveal good pedal pulses.  There is no phlebitis or edema.  There is no cyanosis or clubbing.  Strength is normal and symmetrical in all extremities.  There is no lateralizing weakness.  There are no sensory deficits.  The skin is warm and dry.  There is no rash.     Assessment / Plan: Continue same medication.  Return for a followup echocardiogram to look at LV function.  Recheck in 4 months for followup office visit lipid panel hepatic function panel and basal metabolic panel.

## 2012-09-04 NOTE — Assessment & Plan Note (Signed)
The patient has not had any recognized shocks from his ICD

## 2012-09-04 NOTE — Assessment & Plan Note (Signed)
The patient is not having any symptoms of CHF.  He does tire easily.  He does not have a lot of stamina.  Does try to do some yard work

## 2012-09-04 NOTE — Patient Instructions (Signed)
Your physician recommends that you continue on your current medications as directed. Please refer to the Current Medication list given to you today.  Your physician recommends that you schedule a follow-up appointment in: 4 months with fasting labs (lp/bmet/hfp)   Your physician has requested that you have an echocardiogram. Echocardiography is a painless test that uses sound waves to create images of your heart. It provides your doctor with information about the size and shape of your heart and how well your heart's chambers and valves are working. This procedure takes approximately one hour. There are no restrictions for this procedure.  Work harder on diet and weight loss

## 2012-09-11 ENCOUNTER — Encounter: Payer: BC Managed Care – PPO | Admitting: Internal Medicine

## 2012-09-11 ENCOUNTER — Other Ambulatory Visit (HOSPITAL_COMMUNITY): Payer: BC Managed Care – PPO

## 2012-09-17 ENCOUNTER — Ambulatory Visit (HOSPITAL_COMMUNITY): Payer: BC Managed Care – PPO | Attending: Cardiology

## 2012-09-17 DIAGNOSIS — I255 Ischemic cardiomyopathy: Secondary | ICD-10-CM

## 2012-09-17 DIAGNOSIS — I2589 Other forms of chronic ischemic heart disease: Secondary | ICD-10-CM

## 2012-09-17 DIAGNOSIS — I10 Essential (primary) hypertension: Secondary | ICD-10-CM | POA: Insufficient documentation

## 2012-09-17 DIAGNOSIS — E785 Hyperlipidemia, unspecified: Secondary | ICD-10-CM | POA: Insufficient documentation

## 2012-09-17 DIAGNOSIS — R5381 Other malaise: Secondary | ICD-10-CM | POA: Insufficient documentation

## 2012-09-17 DIAGNOSIS — I2510 Atherosclerotic heart disease of native coronary artery without angina pectoris: Secondary | ICD-10-CM | POA: Insufficient documentation

## 2012-09-17 NOTE — Progress Notes (Signed)
Echocardiogram performed.  

## 2012-09-23 ENCOUNTER — Telehealth: Payer: Self-pay | Admitting: *Deleted

## 2012-09-23 NOTE — Telephone Encounter (Signed)
Advised wife  

## 2012-09-23 NOTE — Telephone Encounter (Signed)
Message copied by Burnell Blanks on Mon Sep 23, 2012  9:47 AM ------      Message from: Cassell Clement      Created: Wed Sep 18, 2012 11:18 AM       Please report.  The left ventricular function is unchanged at 35%.  Continue same medication. ------

## 2012-10-15 ENCOUNTER — Ambulatory Visit (INDEPENDENT_AMBULATORY_CARE_PROVIDER_SITE_OTHER): Payer: BC Managed Care – PPO | Admitting: *Deleted

## 2012-10-15 ENCOUNTER — Telehealth: Payer: Self-pay | Admitting: Internal Medicine

## 2012-10-15 DIAGNOSIS — I2589 Other forms of chronic ischemic heart disease: Secondary | ICD-10-CM

## 2012-10-15 DIAGNOSIS — I255 Ischemic cardiomyopathy: Secondary | ICD-10-CM

## 2012-10-15 DIAGNOSIS — Z95810 Presence of automatic (implantable) cardiac defibrillator: Secondary | ICD-10-CM

## 2012-10-15 LAB — REMOTE ICD DEVICE
BRDY-0002RV: 40 {beats}/min
DEVICE MODEL ICD: 819170
RV LEAD AMPLITUDE: 12 mv

## 2012-10-15 NOTE — Telephone Encounter (Signed)
New Prob     Missed remote check this morning, came in to office instead. Would like to speak to device clinic.

## 2012-10-15 NOTE — Telephone Encounter (Signed)
Transmission received, patient aware. 

## 2012-10-21 ENCOUNTER — Encounter: Payer: Self-pay | Admitting: *Deleted

## 2012-10-24 ENCOUNTER — Encounter: Payer: Self-pay | Admitting: Internal Medicine

## 2012-11-11 ENCOUNTER — Other Ambulatory Visit: Payer: Self-pay | Admitting: Cardiovascular Disease

## 2012-11-22 ENCOUNTER — Other Ambulatory Visit: Payer: Self-pay | Admitting: Cardiology

## 2012-12-24 ENCOUNTER — Encounter: Payer: Self-pay | Admitting: Internal Medicine

## 2012-12-24 ENCOUNTER — Ambulatory Visit (INDEPENDENT_AMBULATORY_CARE_PROVIDER_SITE_OTHER): Payer: Medicare Other | Admitting: Internal Medicine

## 2012-12-24 VITALS — BP 120/84 | HR 80 | Ht 66.0 in | Wt 200.2 lb

## 2012-12-24 DIAGNOSIS — I255 Ischemic cardiomyopathy: Secondary | ICD-10-CM

## 2012-12-24 DIAGNOSIS — Z95810 Presence of automatic (implantable) cardiac defibrillator: Secondary | ICD-10-CM

## 2012-12-24 DIAGNOSIS — I2589 Other forms of chronic ischemic heart disease: Secondary | ICD-10-CM

## 2012-12-24 NOTE — Progress Notes (Signed)
HPI Spencer Horn returns today for followup. He is a very pleasant 62 year old man with an ischemic cardiomyopathy, status post anterior myocardial infarction, with chronic systolic heart failure, ejection fraction 30%. He is status post ICD implantation. In the interim, he has done well except that he has become fairly sedentary. He was initially walking on a regular basis but in the last few months, his exercise  Routine has gone by the way side. He denies chest pain, syncope, or peripheral edema. He has class II heart failure symptoms. He has had a problem with obesity. No Known Allergies   Current Outpatient Prescriptions  Medication Sig Dispense Refill  . aspirin 81 MG tablet Take 81 mg by mouth daily.        [name] atorvastatin (LIPITOR) 40 MG tablet Take 1 tablet (40 mg total) by mouth daily.  90 tablet  3  . carvedilol (COREG) 3.125 MG tablet TAKE ONE TABLET BY MOUTH TWICE DAILY WITH MEALS  60 tablet  0  . EFFIENT 10 MG TABS TAKE ONE TABLET BY MOUTH DAILY  30 each  6  . losartan (COZAAR) 25 MG tablet Take 1 tablet (25 mg total) by mouth daily.  90 tablet  3  . Multiple Vitamin (MULTIVITAMIN) tablet Take 1 tablet by mouth daily.      . nitroGLYCERIN (NITROSTAT) 0.4 MG SL tablet Place 0.4 mg under the tongue every 5 (five) minutes as needed.        [name] spironolactone (ALDACTONE) 25 MG tablet TAKE ONE-HALF TABLET BY MOUTH EVERY DAY  30 tablet  10   No current facility-administered medications for this visit.     Past Medical History  Diagnosis Date  . History of acute anterior wall MI   . Dyslipidemia   . Ventricular dysfunction 07/07/2010    Severe left ventricular dysfunction with ejection fraction of 30-35%  . Personal history of MRSA (methicillin resistant Staphylococcus aureus)   . Hyperlipidemia   . Coronary artery disease   . Ischemic cardiomyopathy   . Tobacco abuse   . ICD (implantable cardiac defibrillator) in place June 2012    ROS:   All systems reviewed and negative except  as noted in the HPI.   Past Surgical History  Procedure Laterality Date  . Cardiac catheterization  07/07/2010    DES to proximal LAD  . Cardiac catheterization  2004  . Icd implant  June 2012     Family History  Problem Relation Age of Onset  . Heart disease Neg Hx      History   Social History  . Marital Status: Married    Spouse Name: N/A    Number of Children: N/A  . Years of Education: N/A   Occupational History  . Not on file.   Social History Main Topics  . Smoking status: Former Smoker    Types: Cigarettes  . Smokeless tobacco: Not on file  . Alcohol Use: No  . Drug Use: No  . Sexually Active: Yes   Other Topics Concern  . Not on file   Social History Narrative  . No narrative on file     BP 120/84  Pulse 80  Ht 5\' 6"  (1.676 m)  Wt 200 lb 3.2 oz (90.81 kg)  BMI 32.33 kg/m2  Physical Exam:  Well appearing obese, middle-aged man,NAD HEENT: Unremarkable Neck:  No JVD, no thyromegally Back:  No CVA tenderness Lungs:  Clear with no wheezes, rales, or rhonchi. HEART:  Regular rate rhythm, no murmurs, no rubs,  no clicks Abd:  soft, positive bowel sounds, no organomegally, no rebound, no guarding Ext:  2 plus pulses, no edema, no cyanosis, no clubbing Skin:  No rashes no nodules Neuro:  CN II through XII intact, motor grossly intact  EKG - sinus rhythm with septal MI  DEVICE  Normal device function.  See PaceArt for details.   Assess/Plan:

## 2012-12-24 NOTE — Patient Instructions (Addendum)
Your physician wants you to follow-up in: 6 months with device clinic and 12 months with Dr Court Joy will receive a reminder letter in the mail two months in advance. If you don't receive a letter, please call our office to schedule the follow-up appointment.  Remote monitoring is used to monitor your Pacemaker or ICD from home. This monitoring reduces the number of office visits required to check your device to one time per year. It allows [place] to keep an eye on the functioning of your device to ensure it is working properly. You are scheduled for a device check from home on 03/31/13. You may send your transmission at any time that day. If you have a wireless device, the transmission will be sent automatically. After your physician reviews your transmission, you will receive a postcard with your next transmission date.

## 2012-12-24 NOTE — Assessment & Plan Note (Signed)
His St. Jude ICD is working normally. We'll continue his followup in several months.

## 2012-12-24 NOTE — Assessment & Plan Note (Signed)
The patient denies anginal symptoms. He will continue his current medical therapy. I've encouraged the patient to increase his physical activity.

## 2012-12-25 LAB — ICD DEVICE OBSERVATION
BRDY-0002RV: 40 {beats}/min
DEVICE MODEL ICD: 819170
FVT: 0
PACEART VT: 0
RV LEAD AMPLITUDE: 12 mv
RV LEAD IMPEDENCE ICD: 437.5 Ohm
RV LEAD THRESHOLD: 1 V
TOT-0008: 0
VF: 0

## 2012-12-26 ENCOUNTER — Other Ambulatory Visit: Payer: Self-pay

## 2012-12-26 MED ORDER — CARVEDILOL 3.125 MG PO TABS: ORAL_TABLET | ORAL | Status: DC

## 2013-01-15 ENCOUNTER — Ambulatory Visit: Payer: BC Managed Care – PPO | Admitting: Internal Medicine

## 2013-01-15 DIAGNOSIS — I2510 Atherosclerotic heart disease of native coronary artery without angina pectoris: Secondary | ICD-10-CM

## 2013-01-15 DIAGNOSIS — I10 Essential (primary) hypertension: Secondary | ICD-10-CM

## 2013-01-16 LAB — HEPATIC FUNCTION PANEL
ALT: 16 U/L (ref 0–53)
Alkaline Phosphatase: 72 U/L (ref 39–117)
Bilirubin, Direct: 0.1 mg/dL (ref 0.0–0.3)
Total Bilirubin: 0.9 mg/dL (ref 0.3–1.2)
Total Protein: 7.2 g/dL (ref 6.0–8.3)

## 2013-01-16 LAB — BASIC METABOLIC PANEL
CO2: 27 mEq/L (ref 19–32)
Chloride: 101 mEq/L (ref 96–112)
Potassium: 4.8 mEq/L (ref 3.5–5.1)
Sodium: 134 mEq/L — ABNORMAL LOW (ref 135–145)

## 2013-01-16 LAB — LIPID PANEL
LDL Cholesterol: 43 mg/dL (ref 0–99)
Total CHOL/HDL Ratio: 3
Triglycerides: 132 mg/dL (ref 0.0–149.0)

## 2013-01-17 ENCOUNTER — Other Ambulatory Visit: Payer: BC Managed Care – PPO

## 2013-01-17 ENCOUNTER — Encounter: Payer: BC Managed Care – PPO | Admitting: Internal Medicine

## 2013-01-17 NOTE — Progress Notes (Signed)
Quick Note:  Please make copy of labs for patient visit. ______ 

## 2013-01-22 ENCOUNTER — Ambulatory Visit (INDEPENDENT_AMBULATORY_CARE_PROVIDER_SITE_OTHER): Payer: Medicare Other | Admitting: Cardiology

## 2013-01-22 ENCOUNTER — Encounter: Payer: Self-pay | Admitting: Cardiology

## 2013-01-22 VITALS — BP 120/80 | HR 80 | Ht 66.0 in | Wt 201.1 lb

## 2013-01-22 DIAGNOSIS — I2589 Other forms of chronic ischemic heart disease: Secondary | ICD-10-CM

## 2013-01-22 DIAGNOSIS — E785 Hyperlipidemia, unspecified: Secondary | ICD-10-CM

## 2013-01-22 DIAGNOSIS — E7800 Pure hypercholesterolemia, unspecified: Secondary | ICD-10-CM

## 2013-01-22 DIAGNOSIS — I255 Ischemic cardiomyopathy: Secondary | ICD-10-CM

## 2013-01-22 DIAGNOSIS — I2510 Atherosclerotic heart disease of native coronary artery without angina pectoris: Secondary | ICD-10-CM

## 2013-01-22 DIAGNOSIS — Z95810 Presence of automatic (implantable) cardiac defibrillator: Secondary | ICD-10-CM

## 2013-01-22 NOTE — Patient Instructions (Signed)
Work harder and diet and weight loss  Your physician recommends that you continue on your current medications as directed. Please refer to the Current Medication list given to you today.  Your physician wants you to follow-up in: 4 months with fasting labs (lp/bmet/hfp)  You will receive a reminder letter in the mail two months in advance. If you don't receive a letter, please call our office to schedule the follow-up appointment.

## 2013-01-22 NOTE — Progress Notes (Signed)
Rosealee Albee Date of Birth:  1950-08-24 [facility] [address] [place], [name]  [address] [phone]         Fax   [phone]  History of Present Illness: This pleasant 62 year old gentleman is seen for a scheduled four-month followup office visit. He has a history of severe ischemic heart disease. He has an ischemic cardiomyopathy. He had his first heart attack in 2004. In 2011 he had a normal treadmill Cardiolite stress test. In February 2012 he had an acute anterior wall myocardial infarction treated with emergency cardiac catheterization and a drug-eluting stent. He was left with akinesis of his entire anterior apical myocardium and after wearing a life chest went on to have the implantation of a defibrillator on 11/10/10. He had an echocardiogram done prior to implantation of the defibrillator which showed an ejection fraction of only 35% on 10/06/10. He is unable to work and is retired on disability. 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 He has been eating too much and has gained another 3 pounds since his last visit.   Current Outpatient Prescriptions  Medication Sig Dispense Refill  . aspirin 81 MG tablet Take 81 mg by mouth daily.        [name] atorvastatin (LIPITOR) 40 MG tablet Take 1 tablet (40 mg total) by mouth daily.  90 tablet  3  . carvedilol (COREG) 3.125 MG tablet TAKE ONE TABLET BY MOUTH TWICE DAILY WITH MEALS  60 tablet  6  . EFFIENT 10 MG TABS TAKE ONE TABLET BY MOUTH DAILY  30 each  6  . losartan (COZAAR) 25 MG tablet Take 1 tablet (25 mg total) by mouth daily.  90 tablet  3  . Multiple Vitamin (MULTIVITAMIN) tablet Take 1 tablet by mouth daily.      . nitroGLYCERIN (NITROSTAT) 0.4 MG SL tablet Place 0.4 mg under the tongue every 5 (five) minutes as needed.        [name] spironolactone (ALDACTONE) 25 MG tablet TAKE ONE-HALF TABLET BY MOUTH EVERY DAY  30 tablet  10   No  current facility-administered medications for this visit.    No Known Allergies  Patient Active Problem List   Diagnosis Date Noted  . Coronary artery disease     Priority: High  . Ischemic cardiomyopathy     Priority: Medium  . Ventricular dysfunction 07/07/2010    Priority: Medium  . Cough 05/07/2012  . Malaise and fatigue 12/29/2011  . Dyspepsia 08/14/2011  . Dual implantable cardioverter-defibrillator in situ 02/21/2011  . Chronic systolic heart failure 10/30/2010  . Hyperlipidemia   . Dyslipidemia   . Hypertension     History  Smoking status  . Former Smoker  . Types: Cigarettes  Smokeless tobacco  . Not on file    History  Alcohol Use No    Family History  Problem Relation Age of Onset  . Heart disease Neg Hx     Review of Systems: Constitutional: no fever chills diaphoresis or fatigue or change in weight.  Head and neck: no hearing loss, no epistaxis, no photophobia or visual disturbance. Respiratory: No cough, shortness of breath or wheezing. Cardiovascular: No chest pain peripheral edema, palpitations. Gastrointestinal: No abdominal distention, no abdominal pain, no change in bowel habits hematochezia or melena. Genitourinary: No dysuria, no frequency, no urgency, no nocturia. Musculoskeletal:No arthralgias, no back pain, no gait disturbance or myalgias. Neurological: No dizziness, no  headaches, no numbness, no seizures, no syncope, no weakness, no tremors. Hematologic: No lymphadenopathy, no easy bruising. Psychiatric: No confusion, no hallucinations, no sleep disturbance.    Physical Exam: Filed Vitals:   01/22/13 1023  BP: 120/80  Pulse: 80   the general appearance reveals a well-developed well-nourished mildly overweight gentleman in no acute distress.  There is truncal obesity.The head and neck exam reveals pupils equal and reactive.  Extraocular movements are full.  There is no scleral icterus.  The mouth and pharynx are normal.  The neck is  supple.  The carotids reveal no bruits.  The jugular venous pressure is normal.  The  thyroid is not enlarged.  There is no lymphadenopathy.  The chest is clear to percussion and auscultation.  There are no rales or rhonchi.  Expansion of the chest is symmetrical.  The precordium is quiet.  The first heart sound is normal.  The second heart sound is physiologically split.  There is no murmur gallop rub or click.  There is no abnormal lift or heave.  The abdomen is soft and nontender.  The bowel sounds are normal.  The liver and spleen are not enlarged.  There are no abdominal masses.  There are no abdominal bruits.  Extremities reveal good pedal pulses.  There is no phlebitis or edema.  There is no cyanosis or clubbing.  Strength is normal and symmetrical in all extremities.  There is no lateralizing weakness.  There are no sensory deficits.  The skin is warm and dry.  There is no rash.     Assessment / Plan: Any same medication.  Work harder on weight loss and decrease total calories.  Return in 4 months for office visit lipid panel hepatic function panel and basal metabolic panel

## 2013-01-22 NOTE — Assessment & Plan Note (Signed)
Patient has not been experiencing any chest pain.  He has not had to take any sublingual nitroglycerin.

## 2013-01-22 NOTE — Assessment & Plan Note (Addendum)
Patient has a history of hyperlipidemia and is on Lipitor 40 mg daily.  No myalgias.  Unfortunately his weight is going up despite careful diet which is low in carbohydrates

## 2013-01-22 NOTE — Assessment & Plan Note (Signed)
The patient has not been aware of any shocks from his defibrillator. 

## 2013-03-27 ENCOUNTER — Other Ambulatory Visit: Payer: Self-pay

## 2013-03-31 ENCOUNTER — Encounter: Payer: BC Managed Care – PPO | Admitting: *Deleted

## 2013-03-31 ENCOUNTER — Encounter: Payer: Self-pay | Admitting: Internal Medicine

## 2013-03-31 DIAGNOSIS — I255 Ischemic cardiomyopathy: Secondary | ICD-10-CM

## 2013-03-31 DIAGNOSIS — Z95810 Presence of automatic (implantable) cardiac defibrillator: Secondary | ICD-10-CM

## 2013-03-31 LAB — MDC_IDC_ENUM_SESS_TYPE_REMOTE
Lead Channel Impedance Value: 400 Ohm
Lead Channel Pacing Threshold Pulse Width: 0.5 ms
Lead Channel Setting Pacing Amplitude: 2.5 V
Lead Channel Setting Pacing Pulse Width: 0.5 ms
Zone Setting Detection Interval: 320 ms

## 2013-04-06 ENCOUNTER — Other Ambulatory Visit: Payer: Self-pay | Admitting: Internal Medicine

## 2013-04-08 ENCOUNTER — Encounter: Payer: Self-pay | Admitting: *Deleted

## 2013-04-11 ENCOUNTER — Other Ambulatory Visit: Payer: Self-pay | Admitting: *Deleted

## 2013-04-11 ENCOUNTER — Other Ambulatory Visit: Payer: Self-pay | Admitting: Cardiology

## 2013-04-11 DIAGNOSIS — E7800 Pure hypercholesterolemia, unspecified: Secondary | ICD-10-CM

## 2013-04-11 MED ORDER — ATORVASTATIN CALCIUM 40 MG PO TABS: 40.0000 mg | ORAL_TABLET | Freq: Every day | ORAL | Status: DC

## 2013-05-07 ENCOUNTER — Telehealth: Payer: Self-pay | Admitting: Cardiology

## 2013-05-07 ENCOUNTER — Other Ambulatory Visit: Payer: Self-pay | Admitting: Cardiology

## 2013-05-07 MED ORDER — LOSARTAN POTASSIUM 25 MG PO TABS: ORAL_TABLET | ORAL | Status: DC

## 2013-05-07 NOTE — Telephone Encounter (Signed)
New problem    Pt needs 30 day supply  LOSARTAN 25 mg,   30 day called into .    [facility] on [address].

## 2013-05-07 NOTE — Telephone Encounter (Signed)
Rx sent to pharmacy as requested.

## 2013-06-11 ENCOUNTER — Other Ambulatory Visit: Payer: Self-pay | Admitting: Cardiovascular Disease

## 2013-06-30 ENCOUNTER — Ambulatory Visit (INDEPENDENT_AMBULATORY_CARE_PROVIDER_SITE_OTHER): Payer: Medicare Other | Admitting: *Deleted

## 2013-06-30 DIAGNOSIS — I2589 Other forms of chronic ischemic heart disease: Secondary | ICD-10-CM

## 2013-06-30 DIAGNOSIS — I255 Ischemic cardiomyopathy: Secondary | ICD-10-CM

## 2013-06-30 LAB — MDC_IDC_ENUM_SESS_TYPE_INCLINIC
Brady Statistic RV Percent Paced: 0 %
Date Time Interrogation Session: 20150209132451
HIGH POWER IMPEDANCE MEASURED VALUE: 78.75 Ohm
Lead Channel Impedance Value: 425 Ohm
Lead Channel Pacing Threshold Amplitude: 1 V
Lead Channel Sensing Intrinsic Amplitude: 12 mV
MDC IDC MSMT BATTERY REMAINING LONGEVITY: 82.8 mo
MDC IDC MSMT LEADCHNL RV PACING THRESHOLD AMPLITUDE: 1 V
MDC IDC MSMT LEADCHNL RV PACING THRESHOLD PULSEWIDTH: 0.5 ms
MDC IDC MSMT LEADCHNL RV PACING THRESHOLD PULSEWIDTH: 0.5 ms
MDC IDC PG SERIAL: 819170
MDC IDC SET LEADCHNL RV PACING AMPLITUDE: 2.5 V
MDC IDC SET LEADCHNL RV PACING PULSEWIDTH: 0.5 ms
MDC IDC SET LEADCHNL RV SENSING SENSITIVITY: 0.5 mV
Zone Setting Detection Interval: 320 ms

## 2013-06-30 NOTE — Progress Notes (Signed)
ICD check in clinic by [employer]. Normal device function. Thresholds and sensing consistent with previous device measurements. Impedance trends stable over time. No evidence of any ventricular arrhythmias. Histogram distribution appropriate for patient and level of activity. Changes per ST protocol. Device programmed at appropriate safety margins. Device programmed to optimize intrinsic conduction. Estimated longevity 6.7535yrs. Pt enrolled in remote follow-up.   [facility] [facility] 10/01/13 & ROV w/ Dr. Ladona Ridgelaylor 12/2013.

## 2013-07-09 ENCOUNTER — Other Ambulatory Visit: Payer: Self-pay | Admitting: Cardiology

## 2013-07-10 ENCOUNTER — Other Ambulatory Visit: Payer: Medicare Other

## 2013-07-14 ENCOUNTER — Ambulatory Visit: Payer: Medicare Other | Admitting: Cardiology

## 2013-07-22 ENCOUNTER — Encounter: Payer: Self-pay | Admitting: Internal Medicine

## 2013-07-25 ENCOUNTER — Other Ambulatory Visit: Payer: Self-pay | Admitting: Cardiology

## 2013-08-19 ENCOUNTER — Other Ambulatory Visit (INDEPENDENT_AMBULATORY_CARE_PROVIDER_SITE_OTHER): Payer: Medicare Other

## 2013-08-19 DIAGNOSIS — E7800 Pure hypercholesterolemia, unspecified: Secondary | ICD-10-CM

## 2013-08-19 LAB — HEPATIC FUNCTION PANEL
ALBUMIN: 3.8 g/dL (ref 3.5–5.2)
ALT: 16 U/L (ref 0–53)
AST: 21 U/L (ref 0–37)
Alkaline Phosphatase: 74 U/L (ref 39–117)
BILIRUBIN DIRECT: 0.1 mg/dL (ref 0.0–0.3)
TOTAL PROTEIN: 7.3 g/dL (ref 6.0–8.3)
Total Bilirubin: 0.8 mg/dL (ref 0.3–1.2)

## 2013-08-19 LAB — BASIC METABOLIC PANEL
BUN: 15 mg/dL (ref 6–23)
CO2: 26 mEq/L (ref 19–32)
Calcium: 8.9 mg/dL (ref 8.4–10.5)
Chloride: 102 mEq/L (ref 96–112)
Creatinine, Ser: 1.1 mg/dL (ref 0.4–1.5)
GFR: 75.45 mL/min (ref >60.00)
Glucose, Bld: 106 mg/dL — ABNORMAL HIGH (ref 70–99)
POTASSIUM: 4.1 meq/L (ref 3.5–5.1)
SODIUM: 137 meq/L (ref 135–145)

## 2013-08-19 LAB — LIPID PANEL
CHOLESTEROL: 102 mg/dL (ref 0–200)
HDL: 30.6 mg/dL — AB (ref >39.00)
LDL Cholesterol: 43 mg/dL (ref 0–99)
Total CHOL/HDL Ratio: 3
Triglycerides: 144 mg/dL (ref 0.0–149.0)
VLDL: 28.8 mg/dL (ref 0.0–40.0)

## 2013-08-19 NOTE — Progress Notes (Signed)
Quick Note:  Please make copy of labs for patient visit. ______ 

## 2013-08-22 ENCOUNTER — Other Ambulatory Visit: Payer: Self-pay | Admitting: Cardiology

## 2013-08-26 ENCOUNTER — Ambulatory Visit (INDEPENDENT_AMBULATORY_CARE_PROVIDER_SITE_OTHER): Payer: Medicare Other | Admitting: Cardiology

## 2013-08-26 ENCOUNTER — Encounter: Payer: Self-pay | Admitting: Cardiology

## 2013-08-26 VITALS — BP 124/78 | HR 76 | Ht 66.0 in | Wt 202.0 lb

## 2013-08-26 DIAGNOSIS — I2589 Other forms of chronic ischemic heart disease: Secondary | ICD-10-CM

## 2013-08-26 DIAGNOSIS — I2510 Atherosclerotic heart disease of native coronary artery without angina pectoris: Secondary | ICD-10-CM

## 2013-08-26 DIAGNOSIS — I255 Ischemic cardiomyopathy: Secondary | ICD-10-CM

## 2013-08-26 DIAGNOSIS — E785 Hyperlipidemia, unspecified: Secondary | ICD-10-CM

## 2013-08-26 DIAGNOSIS — E7800 Pure hypercholesterolemia, unspecified: Secondary | ICD-10-CM

## 2013-08-26 DIAGNOSIS — Z95810 Presence of automatic (implantable) cardiac defibrillator: Secondary | ICD-10-CM

## 2013-08-26 NOTE — Assessment & Plan Note (Signed)
We reviewed the patient's recent labs.  Total cholesterol is 102, triglycerides 144, HDL 31, and LDL 43.  His liver functions are normal.  He will continue current dose of Lipitor 40 mg daily.  He is not having any myalgias.

## 2013-08-26 NOTE — Progress Notes (Signed)
Spencer Horn Date of Birth:  1950-07-28 [address] [place], [name]  [address] [phone]         Fax   [phone]  History of Present Illness: This pleasant 63 year old gentleman is seen for a scheduled four-month followup office visit. He has a history of severe ischemic heart disease. He has an ischemic cardiomyopathy. He had his first heart attack in 2004. In 2011 he had a normal treadmill Cardiolite stress test. In February 2012 he had an acute anterior wall myocardial infarction treated with emergency cardiac catheterization and a drug-eluting stent. He was left with akinesis of his entire anterior apical myocardium and after wearing a life chest went on to have the implantation of a defibrillator on 11/10/10. He had an echocardiogram done prior to implantation of the defibrillator which showed an ejection fraction of only 35% on 10/06/10. He is unable to work and is retired on disability. 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  He is still overweight.  His weight is up another pound since last visit.   Current Outpatient Prescriptions  Medication Sig Dispense Refill  . aspirin 81 MG tablet Take 81 mg by mouth daily.        [name] atorvastatin (LIPITOR) 40 MG tablet TAKE ONE TABLET BY MOUTH ONCE DAILY  30 tablet  1  . carvedilol (COREG) 3.125 MG tablet TAKE ONE TABLET BY MOUTH TWICE DAILY WITH MEALS  60 tablet  1  . EFFIENT 10 MG TABS tablet TAKE ONE TABLET BY MOUTH ONCE DAILY  30 tablet  1  . losartan (COZAAR) 25 MG tablet TAKE ONE TABLET BY MOUTH EVERY DAY  30 tablet  11  . Multiple Vitamin (MULTIVITAMIN) tablet Take 1 tablet by mouth daily.      . nitroGLYCERIN (NITROSTAT) 0.4 MG SL tablet Place 0.4 mg under the tongue every 5 (five) minutes as needed.        [name] spironolactone (ALDACTONE) 25 MG tablet TAKE ONE-HALF TABLET BY MOUTH ONCE DAILY  15 tablet  1   No current facility-administered  medications for this visit.    No Known Allergies  Patient Active Problem List   Diagnosis Date Noted  . Coronary artery disease     Priority: High  . Ischemic cardiomyopathy     Priority: Medium  . Ventricular dysfunction 07/07/2010    Priority: Medium  . Cough 05/07/2012  . Malaise and fatigue 12/29/2011  . Dyspepsia 08/14/2011  . Dual implantable cardioverter-defibrillator in situ 02/21/2011  . Chronic systolic heart failure 10/30/2010  . Hyperlipidemia   . Dyslipidemia   . Hypertension     History  Smoking status  . Former Smoker  . Types: Cigarettes  Smokeless tobacco  . Not on file    History  Alcohol Use No    Family History  Problem Relation Age of Onset  . Heart disease Neg Hx     Review of Systems: Constitutional: no fever chills diaphoresis or fatigue or change in weight.  Head and neck: no hearing loss, no epistaxis, no photophobia or visual disturbance. Respiratory: No cough, shortness of breath or wheezing. Cardiovascular: No chest pain peripheral edema, palpitations. Gastrointestinal: No abdominal distention, no abdominal pain, no change in bowel habits hematochezia or melena. Genitourinary: No dysuria, no frequency, no urgency, no nocturia. Musculoskeletal:No arthralgias, no back pain, no gait disturbance or myalgias. Neurological: No dizziness, no headaches, no numbness, no seizures,  no syncope, no weakness, no tremors. Hematologic: No lymphadenopathy, no easy bruising. Psychiatric: No confusion, no hallucinations, no sleep disturbance.    Physical Exam: Filed Vitals:   08/26/13 1517  BP: 124/78  Pulse: 76   the general appearance reveals a well-developed well-nourished mildly overweight gentleman in no acute distress.  There is truncal obesity.The head and neck exam reveals pupils equal and reactive.  Extraocular movements are full.  There is no scleral icterus.  The mouth and pharynx are normal.  The neck is supple.  The carotids reveal no  bruits.  The jugular venous pressure is normal.  The  thyroid is not enlarged.  There is no lymphadenopathy.  The chest is clear to percussion and auscultation.  There are no rales or rhonchi.  Expansion of the chest is symmetrical.  The precordium is quiet.  The first heart sound is normal.  The second heart sound is physiologically split.  There is no murmur gallop rub or click.  There is no abnormal lift or heave.  The abdomen is soft and nontender.  The bowel sounds are normal.  The liver and spleen are not enlarged.  There are no abdominal masses.  There are no abdominal bruits.  Extremities reveal good pedal pulses.  There is no phlebitis or edema.  There is no cyanosis or clubbing.  Strength is normal and symmetrical in all extremities.  There is no lateralizing weakness.  There are no sensory deficits.  The skin is warm and dry.  There is no rash.     Assessment / Plan: The patient is to continue same medication.  Work harder on weight loss.  Increase walking activity.  Recheck in 4 months for office visit EKG fasting lipid panel hepatic function panel and basal metabolic panel.

## 2013-08-26 NOTE — Assessment & Plan Note (Signed)
The patient has not been experiencing any recurrent chest pain or angina. 

## 2013-08-26 NOTE — Assessment & Plan Note (Signed)
The patient has had no discharges from his ICD.  Patient is not having symptoms of CHF

## 2013-08-26 NOTE — Patient Instructions (Signed)
Your physician recommends that you continue on your current medications as directed. Please refer to the Current Medication list given to you today.  Your physician wants you to follow-up in: 4 months with fasting labs (lp/bmet/hfp) and ekg You will receive a reminder letter in the mail two months in advance. If you don't receive a letter, please call our office to schedule the follow-up appointment.  

## 2013-09-08 ENCOUNTER — Other Ambulatory Visit: Payer: Self-pay | Admitting: Cardiology

## 2013-09-20 ENCOUNTER — Other Ambulatory Visit: Payer: Self-pay | Admitting: Cardiology

## 2013-09-22 ENCOUNTER — Other Ambulatory Visit: Payer: Self-pay

## 2013-09-22 MED ORDER — ATORVASTATIN CALCIUM 40 MG PO TABS: ORAL_TABLET | ORAL | Status: DC

## 2013-09-22 MED ORDER — SPIRONOLACTONE 25 MG PO TABS: ORAL_TABLET | ORAL | Status: DC

## 2013-10-01 ENCOUNTER — Encounter: Payer: Self-pay | Admitting: Internal Medicine

## 2013-10-01 ENCOUNTER — Ambulatory Visit (INDEPENDENT_AMBULATORY_CARE_PROVIDER_SITE_OTHER): Payer: Medicare Other | Admitting: *Deleted

## 2013-10-01 DIAGNOSIS — I2589 Other forms of chronic ischemic heart disease: Secondary | ICD-10-CM

## 2013-10-01 DIAGNOSIS — I255 Ischemic cardiomyopathy: Secondary | ICD-10-CM

## 2013-10-01 NOTE — Progress Notes (Signed)
Remote ICD transmission.   

## 2013-10-06 LAB — MDC_IDC_ENUM_SESS_TYPE_REMOTE
Battery Remaining Percentage: 72 %
HighPow Impedance: 89 Ohm
Implantable Pulse Generator Serial Number: 819170
Lead Channel Impedance Value: 430 Ohm
Lead Channel Setting Pacing Amplitude: 2.5 V
Lead Channel Setting Sensing Sensitivity: 0.5 mV
MDC IDC MSMT LEADCHNL RV SENSING INTR AMPL: 12 mV
MDC IDC SET LEADCHNL RV PACING PULSEWIDTH: 0.5 ms
MDC IDC STAT BRADY RV PERCENT PACED: 0 %
Zone Setting Detection Interval: 320 ms

## 2013-10-10 ENCOUNTER — Other Ambulatory Visit: Payer: Self-pay | Admitting: Cardiology

## 2013-10-17 ENCOUNTER — Encounter: Payer: Self-pay | Admitting: Cardiology

## 2013-10-21 ENCOUNTER — Other Ambulatory Visit: Payer: Self-pay | Admitting: Cardiology

## 2013-12-25 ENCOUNTER — Other Ambulatory Visit: Payer: Medicare Other

## 2013-12-26 ENCOUNTER — Other Ambulatory Visit (INDEPENDENT_AMBULATORY_CARE_PROVIDER_SITE_OTHER): Payer: Medicare Other

## 2013-12-26 DIAGNOSIS — E7800 Pure hypercholesterolemia, unspecified: Secondary | ICD-10-CM

## 2013-12-26 LAB — LIPID PANEL
CHOL/HDL RATIO: 3
Cholesterol: 91 mg/dL (ref 0–200)
HDL: 28.6 mg/dL — ABNORMAL LOW (ref >39.00)
LDL Cholesterol: 31 mg/dL (ref 0–99)
NonHDL: 62.4
TRIGLYCERIDES: 158 mg/dL — AB (ref 0.0–149.0)
VLDL: 31.6 mg/dL (ref 0.0–40.0)

## 2013-12-26 LAB — BASIC METABOLIC PANEL
BUN: 17 mg/dL (ref 6–23)
CO2: 28 mEq/L (ref 19–32)
CREATININE: 1.1 mg/dL (ref 0.4–1.5)
Calcium: 8.8 mg/dL (ref 8.4–10.5)
Chloride: 103 mEq/L (ref 96–112)
GFR: 74.55 mL/min (ref >60.00)
Glucose, Bld: 94 mg/dL (ref 70–99)
POTASSIUM: 4.6 meq/L (ref 3.5–5.1)
Sodium: 136 mEq/L (ref 135–145)

## 2013-12-26 LAB — HEPATIC FUNCTION PANEL
ALBUMIN: 3.6 g/dL (ref 3.5–5.2)
ALT: 12 U/L (ref 0–53)
AST: 19 U/L (ref 0–37)
Alkaline Phosphatase: 75 U/L (ref 39–117)
Bilirubin, Direct: 0 mg/dL (ref 0.0–0.3)
Total Bilirubin: 0.5 mg/dL (ref 0.2–1.2)
Total Protein: 7 g/dL (ref 6.0–8.3)

## 2013-12-29 NOTE — Progress Notes (Signed)
Quick Note:  Please make copy of labs for patient visit. ______ 

## 2013-12-30 ENCOUNTER — Ambulatory Visit (INDEPENDENT_AMBULATORY_CARE_PROVIDER_SITE_OTHER): Payer: Medicare Other | Admitting: *Deleted

## 2013-12-30 ENCOUNTER — Ambulatory Visit (INDEPENDENT_AMBULATORY_CARE_PROVIDER_SITE_OTHER): Payer: Medicare Other | Admitting: Cardiology

## 2013-12-30 ENCOUNTER — Encounter: Payer: Self-pay | Admitting: Cardiology

## 2013-12-30 VITALS — BP 122/82 | HR 71 | Ht 66.0 in | Wt 199.0 lb

## 2013-12-30 DIAGNOSIS — E785 Hyperlipidemia, unspecified: Secondary | ICD-10-CM

## 2013-12-30 DIAGNOSIS — F411 Generalized anxiety disorder: Secondary | ICD-10-CM

## 2013-12-30 DIAGNOSIS — I255 Ischemic cardiomyopathy: Secondary | ICD-10-CM

## 2013-12-30 DIAGNOSIS — I2510 Atherosclerotic heart disease of native coronary artery without angina pectoris: Secondary | ICD-10-CM

## 2013-12-30 DIAGNOSIS — I2589 Other forms of chronic ischemic heart disease: Secondary | ICD-10-CM

## 2013-12-30 DIAGNOSIS — I5022 Chronic systolic (congestive) heart failure: Secondary | ICD-10-CM

## 2013-12-30 DIAGNOSIS — F419 Anxiety disorder, unspecified: Secondary | ICD-10-CM

## 2013-12-30 LAB — MDC_IDC_ENUM_SESS_TYPE_INCLINIC
Battery Remaining Longevity: 78 mo
HIGH POWER IMPEDANCE MEASURED VALUE: 87 Ohm
HighPow Impedance: 86.625
Lead Channel Pacing Threshold Amplitude: 1 V
Lead Channel Pacing Threshold Amplitude: 1 V
Lead Channel Pacing Threshold Pulse Width: 0.5 ms
Lead Channel Sensing Intrinsic Amplitude: 12 mV
Lead Channel Setting Pacing Amplitude: 2.5 V
Lead Channel Setting Pacing Pulse Width: 0.5 ms
Lead Channel Setting Sensing Sensitivity: 0.5 mV
MDC IDC MSMT LEADCHNL RV IMPEDANCE VALUE: 425 Ohm
MDC IDC MSMT LEADCHNL RV PACING THRESHOLD PULSEWIDTH: 0.5 ms
MDC IDC PG SERIAL: 819170
MDC IDC SESS DTM: 20150811104913
MDC IDC STAT BRADY RV PERCENT PACED: 0 %
Zone Setting Detection Interval: 320 ms

## 2013-12-30 MED ORDER — ALPRAZOLAM 0.25 MG PO TABS: 0.2500 mg | ORAL_TABLET | Freq: Every evening | ORAL | Status: DC | PRN

## 2013-12-30 NOTE — Assessment & Plan Note (Signed)
Lipids are satisfactory on current therapy.  No myalgias

## 2013-12-30 NOTE — Assessment & Plan Note (Signed)
The patient has not had any exertional chest pain or angina pectoris.  He has a defibrillator.  He has not been aware of any shocks from his defibrillator.  He has not had dizzy spells or syncope.

## 2013-12-30 NOTE — Assessment & Plan Note (Signed)
The patient and his wife are requesting something for his anxiety.  We will try alprazolam 0.25 mg daily when necessary

## 2013-12-30 NOTE — Progress Notes (Signed)
ICD check in clinic. Normal device function. Thresholds and sensing consistent with previous device measurements. Impedance trends stable over time. No evidence of any ventricular arrhythmias.  Histogram distribution appropriate for patient and level of activity. No changes made this session. Device programmed at appropriate safety margins. Device programmed to optimize intrinsic conduction. Estimated longevity 6.5 years. Pt enrolled in remote follow-up. Plan to check device every 3 months remotely and in office annually. Patient education completed including shock plan. Alert tones/vibration demonstrated for patient.  Checked by [employer] for research.  Merlin 04/02/14.

## 2013-12-30 NOTE — Assessment & Plan Note (Signed)
The patient is not having any symptoms of congestive heart failure.  His exercise tolerance is satisfactory.

## 2013-12-30 NOTE — Progress Notes (Signed)
Spencer Horn Date of Birth:  1950-10-25 [facility] [address] [place], [place]  [id number] [phone]        Fax   [phone]   History of Present Illness: This pleasant 63 year old gentleman is seen for a scheduled four-month followup office visit. He has a history of severe ischemic heart disease. He has an ischemic cardiomyopathy. He had his first heart attack in 2004. In 2011 he had a normal treadmill Cardiolite stress test. In February 2012 he had an acute anterior wall myocardial infarction treated with emergency cardiac catheterization and a drug-eluting stent. He was left with akinesis of his entire anterior apical myocardium and after wearing a life chest went on to have the implantation of a defibrillator on 11/10/10. He had an echocardiogram done prior to implantation of the defibrillator which showed an ejection fraction of only 35% on 10/06/10. He is unable to work and is retired on disability. 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  He has been using his treadmill at home and walking for 30 minutes a day.  He is not having any exertional dyspnea or chest pain.  His weight is down 3 pounds since last visit. Since last visit he has had some problems with anxiety. The patient snores.  His wife notes that occasionally his breathing is regular during the night.  We talked about a sleep study but the patient does not want to have a sleep study at this time.   Current Outpatient Prescriptions  Medication Sig Dispense Refill  . aspirin 81 MG tablet Take 81 mg by mouth daily.        [name]. atorvastatin (LIPITOR) 40 MG tablet TAKE ONE TABLET BY MOUTH ONCE DAILY  90 tablet  1  . carvedilol (COREG) 3.125 MG tablet TAKE ONE TABLET BY MOUTH TWICE DAILY WITH MEALS.  60 tablet  3  . EFFIENT 10 MG TABS tablet TAKE ONE TABLET BY MOUTH ONCE DAILY  30 tablet  2  . losartan (COZAAR) 25 MG tablet TAKE  ONE TABLET BY MOUTH EVERY DAY  30 tablet  11  . Multiple Vitamin (MULTIVITAMIN) tablet Take 1 tablet by mouth daily.      . nitroGLYCERIN (NITROSTAT) 0.4 MG SL tablet Place 0.4 mg under the tongue every 5 (five) minutes as needed.        [name]. spironolactone (ALDACTONE) 25 MG tablet TAKE ONE-HALF TABLET BY MOUTH ONCE DAILY  45 tablet  2  . ALPRAZolam (XANAX) 0.25 MG tablet Take 1 tablet (0.25 mg total) by mouth at bedtime as needed for anxiety.  30 tablet  5   No current facility-administered medications for this visit.    No Known Allergies  Patient Active Problem List   Diagnosis Date Noted  . Coronary artery disease     Priority: High  . Ischemic cardiomyopathy     Priority: Medium  . Ventricular dysfunction 07/07/2010    Priority: Medium  . Anxiety 12/30/2013  . Cough 05/07/2012  . Malaise and fatigue 12/29/2011  . Dyspepsia 08/14/2011  . Dual implantable cardioverter-defibrillator in situ 02/21/2011  . Chronic systolic heart failure 10/30/2010  . Hyperlipidemia   . Dyslipidemia   . Hypertension     History  Smoking status  . Former Smoker  . Types: Cigarettes  Smokeless tobacco  . Not on file    History  Alcohol Use No  Family History  Problem Relation Age of Onset  . Heart disease Neg Hx     Review of Systems: Constitutional: no fever chills diaphoresis or fatigue or change in weight.  Head and neck: no hearing loss, no epistaxis, no photophobia or visual disturbance. Respiratory: No cough, shortness of breath or wheezing. Cardiovascular: No chest pain peripheral edema, palpitations. Gastrointestinal: No abdominal distention, no abdominal pain, no change in bowel habits hematochezia or melena. Genitourinary: No dysuria, no frequency, no urgency, no nocturia. Musculoskeletal:No arthralgias, no back pain, no gait disturbance or myalgias. Neurological: No dizziness, no headaches, no numbness, no seizures, no syncope, no weakness, no tremors. Hematologic: No  lymphadenopathy, no easy bruising. Psychiatric: No confusion, no hallucinations, no sleep disturbance.    Physical Exam: Filed Vitals:   12/30/13 0914  BP: 122/82  Pulse: 71   the general appearance reveals a well-developed well-nourished gentleman in no distress.The head and neck exam reveals pupils equal and reactive.  Extraocular movements are full.  There is no scleral icterus.  The mouth and pharynx are normal.  The neck is supple.  The carotids reveal no bruits.  The jugular venous pressure is normal.  The  thyroid is not enlarged.  There is no lymphadenopathy.  The chest is clear to percussion and auscultation.  There are no rales or rhonchi.  Expansion of the chest is symmetrical.  The defibrillator in the left upper chest.  The precordium is quiet.  The first heart sound is normal.  The second heart sound is physiologically split.  There is no murmur gallop rub or click.  There is no abnormal lift or heave.  The abdomen is soft and nontender.  The bowel sounds are normal.  The liver and spleen are not enlarged.  There are no abdominal masses.  There are no abdominal bruits.  Extremities reveal good pedal pulses.  There is no phlebitis or edema.  There is no cyanosis or clubbing.  Strength is normal and symmetrical in all extremities.  There is no lateralizing weakness.  There are no sensory deficits.  The skin is warm and dry.  There is no rash.  EKG shows normal sinus rhythm and old anteroseptal myocardial infarction and nonspecific ST-T wave changes and is unchanged since 12/24/12   Assessment / Plan: 1. ischemic cardiomyopathy 2. Dyslipidemia 3. Anxiety 4. functioning defibrillator present  Disposition: Continue same medication.  Continue to try to lose weight.  Start Xanax 0.25 mg when necessary for anxiety. Recheck in 4 months for followup office visit lipid panel hepatic function panel and basal metabolic panel

## 2013-12-30 NOTE — Patient Instructions (Signed)
Your physician has recommended you make the following change in your medication:   1. Start Xanax 0.25 mg 1 tablet as needed daily.   Your physician recommends that you continue on your current medications as directed. Please refer to the Current Medication list given to you today.  Your physician recommends that you return for a FASTING lipid profile, hepatic and bmet in 4 months.   Your physician recommends that you schedule a follow-up appointment in: 4 months with [name].

## 2014-01-08 ENCOUNTER — Encounter: Payer: Self-pay | Admitting: Internal Medicine

## 2014-01-09 ENCOUNTER — Other Ambulatory Visit: Payer: Self-pay | Admitting: Cardiology

## 2014-02-19 ENCOUNTER — Other Ambulatory Visit: Payer: Self-pay

## 2014-02-19 ENCOUNTER — Other Ambulatory Visit: Payer: Self-pay | Admitting: Cardiology

## 2014-02-19 MED ORDER — CARVEDILOL 3.125 MG PO TABS: ORAL_TABLET | ORAL | Status: DC

## 2014-04-02 ENCOUNTER — Ambulatory Visit (INDEPENDENT_AMBULATORY_CARE_PROVIDER_SITE_OTHER): Payer: Medicare Other | Admitting: *Deleted

## 2014-04-02 ENCOUNTER — Encounter: Payer: Self-pay | Admitting: Internal Medicine

## 2014-04-02 DIAGNOSIS — I255 Ischemic cardiomyopathy: Secondary | ICD-10-CM

## 2014-04-03 NOTE — Progress Notes (Signed)
Remote ICD transmission.   

## 2014-04-20 LAB — MDC_IDC_ENUM_SESS_TYPE_REMOTE
Lead Channel Impedance Value: 400 Ohm
Lead Channel Sensing Intrinsic Amplitude: 12 mV
Lead Channel Setting Pacing Amplitude: 2.5 V
Lead Channel Setting Sensing Sensitivity: 0.5 mV
MDC IDC PG SERIAL: 819170
MDC IDC SET LEADCHNL RV PACING PULSEWIDTH: 0.5 ms
Zone Setting Detection Interval: 320 ms

## 2014-05-04 ENCOUNTER — Other Ambulatory Visit (INDEPENDENT_AMBULATORY_CARE_PROVIDER_SITE_OTHER): Payer: Medicare Other | Admitting: *Deleted

## 2014-05-04 DIAGNOSIS — I255 Ischemic cardiomyopathy: Secondary | ICD-10-CM

## 2014-05-04 DIAGNOSIS — E785 Hyperlipidemia, unspecified: Secondary | ICD-10-CM

## 2014-05-04 LAB — HEPATIC FUNCTION PANEL
ALK PHOS: 72 U/L (ref 39–117)
ALT: 15 U/L (ref 0–53)
AST: 20 U/L (ref 0–37)
Albumin: 3.8 g/dL (ref 3.5–5.2)
BILIRUBIN TOTAL: 0.7 mg/dL (ref 0.2–1.2)
Bilirubin, Direct: 0 mg/dL (ref 0.0–0.3)
Total Protein: 7.1 g/dL (ref 6.0–8.3)

## 2014-05-04 LAB — BASIC METABOLIC PANEL
BUN: 20 mg/dL (ref 6–23)
CALCIUM: 8.9 mg/dL (ref 8.4–10.5)
CO2: 26 meq/L (ref 19–32)
CREATININE: 1 mg/dL (ref 0.4–1.5)
Chloride: 104 mEq/L (ref 96–112)
GFR: 79.59 mL/min (ref >60.00)
Glucose, Bld: 101 mg/dL — ABNORMAL HIGH (ref 70–99)
Potassium: 4.1 mEq/L (ref 3.5–5.1)
Sodium: 136 mEq/L (ref 135–145)

## 2014-05-04 LAB — LIPID PANEL
CHOLESTEROL: 99 mg/dL (ref 0–200)
HDL: 30.6 mg/dL — ABNORMAL LOW (ref >39.00)
LDL Cholesterol: 39 mg/dL (ref 0–99)
NONHDL: 68.4
Total CHOL/HDL Ratio: 3
Triglycerides: 149 mg/dL (ref 0.0–149.0)
VLDL: 29.8 mg/dL (ref 0.0–40.0)

## 2014-05-04 NOTE — Progress Notes (Signed)
Quick Note:  Please make copy of labs for patient visit. ______ 

## 2014-05-07 ENCOUNTER — Encounter: Payer: Self-pay | Admitting: Cardiology

## 2014-05-07 ENCOUNTER — Ambulatory Visit (INDEPENDENT_AMBULATORY_CARE_PROVIDER_SITE_OTHER): Payer: Medicare Other | Admitting: Cardiology

## 2014-05-07 VITALS — BP 128/78 | HR 74 | Ht 66.0 in | Wt 202.8 lb

## 2014-05-07 DIAGNOSIS — E785 Hyperlipidemia, unspecified: Secondary | ICD-10-CM

## 2014-05-07 DIAGNOSIS — I2510 Atherosclerotic heart disease of native coronary artery without angina pectoris: Secondary | ICD-10-CM

## 2014-05-07 DIAGNOSIS — I519 Heart disease, unspecified: Secondary | ICD-10-CM

## 2014-05-07 DIAGNOSIS — I2583 Coronary atherosclerosis due to lipid rich plaque: Secondary | ICD-10-CM

## 2014-05-07 DIAGNOSIS — I255 Ischemic cardiomyopathy: Secondary | ICD-10-CM

## 2014-05-07 DIAGNOSIS — I10 Essential (primary) hypertension: Secondary | ICD-10-CM

## 2014-05-07 NOTE — Progress Notes (Signed)
Spencer Horn Date of Birth:  [birth year] [facility] [address] [place], [place]  [id number] [phone]        Fax   [phone]   History of Present Illness: This pleasant 63 year old gentleman is seen for a scheduled four-month followup office visit. He has a history of severe ischemic heart disease. He has an ischemic cardiomyopathy. He had his first heart attack in 2004. In 2011 he had a normal treadmill Cardiolite stress test. In February 2012 he had an acute anterior wall myocardial infarction treated with emergency cardiac catheterization and a drug-eluting stent. He was left with akinesis of his entire anterior apical myocardium and after wearing a life chest went on to have the implantation of a defibrillator on 11/10/10. He had an echocardiogram done prior to implantation of the defibrillator which showed an ejection fraction of only 35% on 10/06/10. He is unable to work and is retired on disability. 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  He has been using his treadmill at home and walking for 30 minutes a day.  He is not having any exertional dyspnea or chest pain.  His weight is back up 3 pounds since last visit. Since last visit he has had some problems with anxiety. The patient snores.  His wife notes that occasionally his breathing is regular during the night.  The patient has declined a sleep study in the past.   Current Outpatient Prescriptions  Medication Sig Dispense Refill  . ALPRAZolam (XANAX) 0.25 MG tablet Take 1 tablet (0.25 mg total) by mouth at bedtime as needed for anxiety. 30 tablet 5  . aspirin 81 MG tablet Take 81 mg by mouth daily.      [name]. atorvastatin (LIPITOR) 40 MG tablet TAKE ONE TABLET BY MOUTH ONCE DAILY 90 tablet 1  . carvedilol (COREG) 6.25 MG tablet Take 6.25 mg by mouth daily. Take a half tablet 2 times daily    . EFFIENT 10 MG TABS tablet TAKE ONE  TABLET BY MOUTH ONCE DAILY 30 tablet 3  . losartan (COZAAR) 25 MG tablet TAKE ONE TABLET BY MOUTH EVERY DAY 30 tablet 11  . Multiple Vitamin (MULTIVITAMIN) tablet Take 1 tablet by mouth daily.    . nitroGLYCERIN (NITROSTAT) 0.4 MG SL tablet Place 0.4 mg under the tongue every 5 (five) minutes as needed.      [name]. spironolactone (ALDACTONE) 25 MG tablet TAKE ONE-HALF TABLET BY MOUTH ONCE DAILY 45 tablet 2   No current facility-administered medications for this visit.    No Known Allergies  Patient Active Problem List   Diagnosis Date Noted  . Coronary artery disease     Priority: High  . Ischemic cardiomyopathy     Priority: Medium  . Ventricular dysfunction 07/07/2010    Priority: Medium  . Anxiety 12/30/2013  . Cough 05/07/2012  . Malaise and fatigue 12/29/2011  . Dyspepsia 08/14/2011  . Dual implantable cardioverter-defibrillator in situ 02/21/2011  . Chronic systolic heart failure 10/30/2010  . Hyperlipidemia   . Dyslipidemia   . Hypertension     History  Smoking status  . Former Smoker  . Types: Cigarettes  Smokeless tobacco  . Not on file    History  Alcohol Use No    Family History  Problem Relation Age of Onset  . Heart disease Neg Hx     Review of Systems: Constitutional: no fever  chills diaphoresis or fatigue or change in weight.  Head and neck: no hearing loss, no epistaxis, no photophobia or visual disturbance. Respiratory: No cough, shortness of breath or wheezing. Cardiovascular: No chest pain peripheral edema, palpitations. Gastrointestinal: No abdominal distention, no abdominal pain, no change in bowel habits hematochezia or melena. Genitourinary: No dysuria, no frequency, no urgency, no nocturia. Musculoskeletal:No arthralgias, no back pain, no gait disturbance or myalgias. Neurological: No dizziness, no headaches, no numbness, no seizures, no syncope, no weakness, no tremors. Hematologic: No lymphadenopathy, no easy bruising. Psychiatric: No  confusion, no hallucinations, no sleep disturbance.    Physical Exam: Filed Vitals:   05/07/14 0934  BP: 128/78  Pulse: 74   the general appearance reveals a well-developed well-nourished gentleman in no distress.The head and neck exam reveals pupils equal and reactive.  Extraocular movements are full.  There is no scleral icterus.  The mouth and pharynx are normal.  The neck is supple.  The carotids reveal no bruits.  The jugular venous pressure is normal.  The  thyroid is not enlarged.  There is no lymphadenopathy.  The chest is clear to percussion and auscultation.  There are no rales or rhonchi.  Expansion of the chest is symmetrical.  The defibrillator in the left upper chest.  The precordium is quiet.  The first heart sound is normal.  The second heart sound is physiologically split.  There is no murmur gallop rub or click.  There is no abnormal lift or heave.  The abdomen is soft and nontender.  The bowel sounds are normal.  The liver and spleen are not enlarged.  There are no abdominal masses.  There are no abdominal bruits.  Extremities reveal good pedal pulses.  There is no phlebitis or edema.  There is no cyanosis or clubbing.  Strength is normal and symmetrical in all extremities.  There is no lateralizing weakness.  There are no sensory deficits.  The skin is warm and dry.  There is no rash.  EKG shows normal sinus rhythm and old anteroseptal myocardial infarction and nonspecific ST-T wave changes and is unchanged since 12/30/13   Assessment / Plan: 1. ischemic cardiomyopathy secondary to remote extensive anterior wall myocardial infarction treated with drug-eluting stent to the LAD. 2. Dyslipidemia 3. Anxiety 4. functioning defibrillator present  Disposition: Continue same medication.  Continue to try to lose weight.  He will be starting a Silver [employer] exercise program at [employer] in January when his insurance changes. Recheck in 4 months for followup office visit lipid panel  hepatic function panel and basal metabolic panel

## 2014-05-07 NOTE — Patient Instructions (Signed)
Your physician recommends that you continue on your current medications as directed. Please refer to the Current Medication list given to you today.  Your physician wants you to follow-up in: 4 months with fasting labs (lp/bmet/hfp)  You will receive a reminder letter in the mail two months in advance. If you don't receive a letter, please call our office to schedule the follow-up appointment.   Work on weight loss

## 2014-05-07 NOTE — Assessment & Plan Note (Signed)
Lab work remains excellent on current therapy

## 2014-05-07 NOTE — Assessment & Plan Note (Signed)
The patient has not been having any chest pain or angina 

## 2014-05-07 NOTE — Assessment & Plan Note (Signed)
The patient is not having any orthopnea or paroxysmal nocturnal dyspnea.  No peripheral edema.

## 2014-05-11 ENCOUNTER — Other Ambulatory Visit: Payer: Self-pay | Admitting: Cardiology

## 2014-05-12 ENCOUNTER — Other Ambulatory Visit: Payer: Self-pay

## 2014-05-12 MED ORDER — PRASUGREL HCL 10 MG PO TABS: 10.0000 mg | ORAL_TABLET | Freq: Every day | ORAL | Status: DC

## 2014-06-18 ENCOUNTER — Encounter: Payer: Self-pay | Admitting: Internal Medicine

## 2014-06-18 ENCOUNTER — Ambulatory Visit (INDEPENDENT_AMBULATORY_CARE_PROVIDER_SITE_OTHER): Payer: Medicare Other | Admitting: Internal Medicine

## 2014-06-18 VITALS — BP 104/74 | HR 73 | Ht 66.0 in | Wt 205.6 lb

## 2014-06-18 DIAGNOSIS — Z95810 Presence of automatic (implantable) cardiac defibrillator: Secondary | ICD-10-CM | POA: Diagnosis not present

## 2014-06-18 DIAGNOSIS — I10 Essential (primary) hypertension: Secondary | ICD-10-CM

## 2014-06-18 DIAGNOSIS — I5022 Chronic systolic (congestive) heart failure: Secondary | ICD-10-CM | POA: Diagnosis not present

## 2014-06-18 DIAGNOSIS — Z4502 Encounter for adjustment and management of automatic implantable cardiac defibrillator: Secondary | ICD-10-CM

## 2014-06-18 DIAGNOSIS — I255 Ischemic cardiomyopathy: Secondary | ICD-10-CM | POA: Diagnosis not present

## 2014-06-18 LAB — MDC_IDC_ENUM_SESS_TYPE_INCLINIC
Battery Remaining Longevity: 74.4 mo
Brady Statistic RV Percent Paced: 0 %
Date Time Interrogation Session: 20160128110101
HighPow Impedance: 85.5 Ohm
Lead Channel Pacing Threshold Amplitude: 1.25 V
Lead Channel Pacing Threshold Amplitude: 1.25 V
Lead Channel Pacing Threshold Pulse Width: 0.5 ms
Lead Channel Setting Pacing Amplitude: 2.5 V
MDC IDC MSMT LEADCHNL RV IMPEDANCE VALUE: 425 Ohm
MDC IDC MSMT LEADCHNL RV PACING THRESHOLD PULSEWIDTH: 0.5 ms
MDC IDC MSMT LEADCHNL RV SENSING INTR AMPL: 12 mV
MDC IDC PG SERIAL: 819170
MDC IDC SET LEADCHNL RV PACING PULSEWIDTH: 0.5 ms
MDC IDC SET LEADCHNL RV SENSING SENSITIVITY: 0.5 mV
Zone Setting Detection Interval: 320 ms

## 2014-06-18 NOTE — Patient Instructions (Signed)
Your physician recommends that you continue on your current medications as directed. Please refer to the Current Medication list given to you today.  Your physician wants you to follow-up in: 6 months with research and device clinic.  You will receive a reminder letter in the mail two months in advance. If you don't receive a letter, please call our office to schedule the follow-up appointment.  Your physician wants you to follow-up in: 1 year with Dr. Taylor.  You will receive a reminder letter in the mail two months in advance. If you don't receive a letter, please call our office to schedule the follow-up appointment.  

## 2014-06-18 NOTE — Assessment & Plan Note (Signed)
His [employer] device is working normally. Will recheck in several months.  

## 2014-06-18 NOTE — Assessment & Plan Note (Signed)
His blood pressure is well controlled. No change in meds.  

## 2014-06-18 NOTE — Assessment & Plan Note (Signed)
His symptoms are class 2. He will continue his current meds.  

## 2014-06-18 NOTE — Progress Notes (Signed)
HPI [name] returns today for followup. He is a very pleasant 64 year old man with an ischemic cardiomyopathy, status post anterior myocardial infarction, with chronic systolic heart failure, class 2, ejection fraction 30%. He is status post ICD implantation. In the interim, he has done well except that he has become fairly sedentary. He was initially walking on a regular basis but in the last few months, his exercise routine has been redued. He denies chest pain, syncope, or peripheral edema. He has had a problem with obesity. No Known Allergies   Current Outpatient Prescriptions  Medication Sig Dispense Refill  . ALPRAZolam (XANAX) 0.25 MG tablet Take 1 tablet (0.25 mg total) by mouth at bedtime as needed for anxiety. 30 tablet 5  . aspirin 81 MG tablet Take 81 mg by mouth daily.      [name]. atorvastatin (LIPITOR) 40 MG tablet TAKE ONE TABLET BY MOUTH ONCE DAILY 90 tablet 1  . carvedilol (COREG) 6.25 MG tablet Take 6.25 mg by mouth daily. Take a half tablet 2 times daily    . losartan (COZAAR) 25 MG tablet TAKE ONE TABLET BY MOUTH ONCE DAILY 30 tablet 3  . Multiple Vitamin (MULTIVITAMIN) tablet Take 1 tablet by mouth daily.    . nitroGLYCERIN (NITROSTAT) 0.4 MG SL tablet Place 0.4 mg under the tongue every 5 (five) minutes as needed.      . prasugrel (EFFIENT) 10 MG TABS tablet Take 1 tablet (10 mg total) by mouth daily. 30 tablet 6  . spironolactone (ALDACTONE) 25 MG tablet TAKE ONE-HALF TABLET BY MOUTH ONCE DAILY 45 tablet 2   No current facility-administered medications for this visit.     Past Medical History  Diagnosis Date  . History of acute anterior wall MI   . Dyslipidemia   . Ventricular dysfunction 07/07/2010    Severe left ventricular dysfunction with ejection fraction of 30-35%  . Personal history of MRSA (methicillin resistant Staphylococcus aureus)   . Hyperlipidemia   . Coronary artery disease   . Ischemic cardiomyopathy   . Tobacco abuse   . ICD (implantable cardiac  defibrillator) in place June 2012    ROS:   All systems reviewed and negative except as noted in the HPI.   Past Surgical History  Procedure Laterality Date  . Cardiac catheterization  07/07/2010    DES to proximal LAD  . Cardiac catheterization  2004  . Icd implant  June 2012     Family History  Problem Relation Age of Onset  . Heart disease Neg Hx      History   Social History  . Marital Status: Married    Spouse Name: N/A    Number of Children: N/A  . Years of Education: N/A   Occupational History  . Not on file.   Social History Main Topics  . Smoking status: Former Smoker    Types: Cigarettes  . Smokeless tobacco: Not on file  . Alcohol Use: No  . Drug Use: No  . Sexual Activity: Yes   Other Topics Concern  . Not on file   Social History Narrative     BP 104/74 mmHg  Pulse 73  Ht 5\' 6"  (1.676 m)  Wt 205 lb 9.6 oz (93.26 kg)  BMI 33.20 kg/m2  Physical Exam:  Well appearing obese, middle-aged man,NAD HEENT: Unremarkable Neck:  No JVD, no thyromegally Back:  No CVA tenderness Lungs:  Clear with no wheezes, rales, or rhonchi. HEART:  Regular rate rhythm, no murmurs, no rubs, no clicks  Abd:  soft, positive bowel sounds, no organomegally, no rebound, no guarding Ext:  2 plus pulses, no edema, no cyanosis, no clubbing Skin:  No rashes no nodules Neuro:  CN II through XII intact, motor grossly intact  EKG - sinus rhythm with septal MI  DEVICE  Normal device function.  See PaceArt for details.   Assess/Plan:

## 2014-06-19 ENCOUNTER — Other Ambulatory Visit: Payer: Self-pay | Admitting: Cardiology

## 2014-06-26 ENCOUNTER — Encounter: Payer: Self-pay | Admitting: Internal Medicine

## 2014-07-31 ENCOUNTER — Other Ambulatory Visit: Payer: Self-pay | Admitting: Cardiology

## 2014-07-31 NOTE — Telephone Encounter (Signed)
Could you please clarify what patients current dose should be? Thanks, MI 

## 2014-08-03 ENCOUNTER — Other Ambulatory Visit: Payer: Self-pay

## 2014-08-03 MED ORDER — CARVEDILOL 6.25 MG PO TABS: ORAL_TABLET | ORAL | Status: DC

## 2014-08-25 ENCOUNTER — Other Ambulatory Visit: Payer: Self-pay | Admitting: *Deleted

## 2014-08-25 MED ORDER — LOSARTAN POTASSIUM 25 MG PO TABS: 25.0000 mg | ORAL_TABLET | Freq: Every day | ORAL | Status: DC

## 2014-09-17 ENCOUNTER — Ambulatory Visit (INDEPENDENT_AMBULATORY_CARE_PROVIDER_SITE_OTHER): Payer: Medicare Other | Admitting: *Deleted

## 2014-09-17 DIAGNOSIS — I255 Ischemic cardiomyopathy: Secondary | ICD-10-CM | POA: Diagnosis not present

## 2014-09-17 NOTE — Progress Notes (Signed)
Remote ICD transmission.   

## 2014-09-18 LAB — MDC_IDC_ENUM_SESS_TYPE_REMOTE
Brady Statistic RV Percent Paced: 0 %
Implantable Pulse Generator Serial Number: 819170
Lead Channel Setting Pacing Amplitude: 2.5 V
MDC IDC MSMT LEADCHNL RV SENSING INTR AMPL: 12 mV — AB
MDC IDC SET LEADCHNL RV PACING PULSEWIDTH: 0.5 ms
MDC IDC SET LEADCHNL RV SENSING SENSITIVITY: 0.5 mV
MDC IDC SET ZONE DETECTION INTERVAL: 320 ms

## 2014-09-19 ENCOUNTER — Other Ambulatory Visit: Payer: Self-pay | Admitting: Cardiology

## 2014-09-22 ENCOUNTER — Other Ambulatory Visit: Payer: Self-pay | Admitting: Cardiology

## 2014-09-30 ENCOUNTER — Other Ambulatory Visit (INDEPENDENT_AMBULATORY_CARE_PROVIDER_SITE_OTHER): Payer: Medicare Other

## 2014-09-30 DIAGNOSIS — I10 Essential (primary) hypertension: Secondary | ICD-10-CM | POA: Diagnosis not present

## 2014-09-30 DIAGNOSIS — E785 Hyperlipidemia, unspecified: Secondary | ICD-10-CM

## 2014-09-30 LAB — HEPATIC FUNCTION PANEL
ALK PHOS: 78 U/L (ref 39–117)
ALT: 13 U/L (ref 0–53)
AST: 19 U/L (ref 0–37)
Albumin: 3.9 g/dL (ref 3.5–5.2)
BILIRUBIN DIRECT: 0.1 mg/dL (ref 0.0–0.3)
Total Bilirubin: 0.7 mg/dL (ref 0.2–1.2)
Total Protein: 7.4 g/dL (ref 6.0–8.3)

## 2014-09-30 LAB — BASIC METABOLIC PANEL
BUN: 16 mg/dL (ref 6–23)
CALCIUM: 9.3 mg/dL (ref 8.4–10.5)
CO2: 29 mEq/L (ref 19–32)
Chloride: 102 mEq/L (ref 96–112)
Creatinine, Ser: 0.98 mg/dL (ref 0.40–1.50)
GFR: 82.3 mL/min (ref >60.00)
Glucose, Bld: 97 mg/dL (ref 70–99)
Potassium: 4.6 mEq/L (ref 3.5–5.1)
SODIUM: 136 meq/L (ref 135–145)

## 2014-09-30 LAB — LIPID PANEL
CHOLESTEROL: 94 mg/dL (ref 0–200)
HDL: 32.2 mg/dL — AB (ref >39.00)
LDL Cholesterol: 33 mg/dL (ref 0–99)
NonHDL: 61.8
TRIGLYCERIDES: 144 mg/dL (ref 0.0–149.0)
Total CHOL/HDL Ratio: 3
VLDL: 28.8 mg/dL (ref 0.0–40.0)

## 2014-09-30 NOTE — Progress Notes (Signed)
Quick Note:  Please make copy of labs for patient visit. ______ 

## 2014-10-01 ENCOUNTER — Encounter: Payer: Self-pay | Admitting: Cardiology

## 2014-10-02 ENCOUNTER — Encounter: Payer: Self-pay | Admitting: Cardiology

## 2014-10-02 ENCOUNTER — Ambulatory Visit (INDEPENDENT_AMBULATORY_CARE_PROVIDER_SITE_OTHER): Payer: Medicare Other | Admitting: Cardiology

## 2014-10-02 VITALS — BP 122/92 | HR 90 | Ht 66.0 in | Wt 206.4 lb

## 2014-10-02 DIAGNOSIS — I5022 Chronic systolic (congestive) heart failure: Secondary | ICD-10-CM

## 2014-10-02 DIAGNOSIS — I255 Ischemic cardiomyopathy: Secondary | ICD-10-CM | POA: Diagnosis not present

## 2014-10-02 DIAGNOSIS — E785 Hyperlipidemia, unspecified: Secondary | ICD-10-CM | POA: Diagnosis not present

## 2014-10-02 DIAGNOSIS — F419 Anxiety disorder, unspecified: Secondary | ICD-10-CM

## 2014-10-02 NOTE — Patient Instructions (Addendum)
Medication Instructions:  Your physician recommends that you continue on your current medications as directed. Please refer to the Current Medication list given to you today.  Labwork: NONE  Testing/Procedures: NONE  Follow-Up: Your physician recommends that you schedule a follow-up appointment in: 4 MONTH OV  Any Other Special Instructions Will Be Listed Below  Work harder on diet and weight loss Monitor you blood pressure at home and call with readings

## 2014-10-02 NOTE — Progress Notes (Signed)
Cardiology Office Note   Date:  10/02/2014   ID:  Spencer AlbeeClaude I Toulouse, DOB Apr 27, 1951, MRN [id number]  PCP:  Judie BonusKollar, [name], MD  Cardiologist: Cassell Clementhomas Dori Devino MD  No chief complaint on file.     History of Present Illness: Spencer AlbeeClaude I Wren is a 64 y.o. male who presents for a four-month follow-up visit.  This pleasant 64 year old gentleman is seen for a scheduled four-month followup office visit. He has a history of severe ischemic heart disease. He has an ischemic cardiomyopathy. He had his first heart attack in 2004. In 2011 he had a normal treadmill Cardiolite stress test. In February 2012 he had an acute anterior wall myocardial infarction treated with emergency cardiac catheterization and a drug-eluting stent. He was left with akinesis of his entire anterior apical myocardium and after wearing a life chest went on to have the implantation of a defibrillator on 11/10/10. He had an echocardiogram done prior to implantation of the defibrillator which showed an ejection fraction of only 35% on 10/06/10. He is unable to work and is retired on disability. 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 He has been using his treadmill at home and walking for 30 minutes a day. He is not having any exertional dyspnea or chest pain. His weight is back up 1 pound since last visit. Since last visit he has had some problems with anxiety. The patient snores. His wife notes that occasionally his breathing is regular during the night. The patient has declined a sleep study in the past. He has a membership through  [employer] to go to [employer].  However, he will not go unless his wife goes and his wife has been sick with diabetes recently.  Past Medical History  Diagnosis Date  . History of acute anterior wall MI   . Dyslipidemia   . Ventricular dysfunction 07/07/2010    Severe left ventricular dysfunction with ejection  fraction of 30-35%  . Personal history of MRSA (methicillin resistant Staphylococcus aureus)   . Hyperlipidemia   . Coronary artery disease   . Ischemic cardiomyopathy   . Tobacco abuse   . ICD (implantable cardiac defibrillator) in place June 2012    Past Surgical History  Procedure Laterality Date  . Cardiac catheterization  07/07/2010    DES to proximal LAD  . Cardiac catheterization  2004  . Icd implant  June 2012     Current Outpatient Prescriptions  Medication Sig Dispense Refill  . ALPRAZolam (XANAX) 0.25 MG tablet Take 1 tablet (0.25 mg total) by mouth at bedtime as needed for anxiety. 30 tablet 5  . aspirin 81 MG tablet Take 81 mg by mouth daily.      [name]. atorvastatin (LIPITOR) 40 MG tablet TAKE ONE TABLET BY MOUTH ONCE DAILY 90 tablet 1  . carvedilol (COREG) 6.25 MG tablet Take 3.125 mg by mouth 2 (two) times daily with a meal.    . losartan (COZAAR) 25 MG tablet Take 1 tablet (25 mg total) by mouth daily. 90 tablet 0  . Multiple Vitamin (MULTIVITAMIN) tablet Take 1 tablet by mouth daily.    . nitroGLYCERIN (NITROSTAT) 0.4 MG SL tablet Place 0.4 mg under the tongue every 5 (five) minutes as needed for chest pain (for chest pain).    . prasugrel (EFFIENT) 10 MG TABS tablet Take 1 tablet (10 mg total) by mouth daily. 30 tablet 6  . spironolactone (  ALDACTONE) 25 MG tablet TAKE ONE-HALF TABLET BY MOUTH ONCE DAILY 45 tablet 0   No current facility-administered medications for this visit.    Allergies:   Review of patient's allergies indicates no known allergies.    Social History:  The patient  reports that he has quit smoking. His smoking use included Cigarettes. He does not have any smokeless tobacco history on file. He reports that he does not drink alcohol or use illicit drugs.   Family History:  The patient's family history is negative for Heart disease.    ROS:  Please see the history of present illness.   Otherwise, review of systems are positive for none.   All  other systems are reviewed and negative.    PHYSICAL EXAM: VS:  BP 122/92 mmHg  Pulse 90  Ht 5\' 6"  (1.676 m)  Wt 206 lb 6.4 oz (93.622 kg)  BMI 33.33 kg/m2 , BMI Body mass index is 33.33 kg/(m^2). GEN: Well nourished, well developed, in no acute distress HEENT: normal Neck: no JVD, carotid bruits, or masses Cardiac: RRR; no murmurs, rubs, or gallops,no edema  Respiratory:  clear to auscultation bilaterally, normal work of breathing GI: soft, nontender, nondistended, + BS MS: no deformity or atrophy Skin: warm and dry, no rash Neuro:  Strength and sensation are intact Psych: euthymic mood, full affect   EKG:  EKG is not ordered today.    Recent Labs: 09/30/2014: ALT 13; BUN 16; Creatinine 0.98; Potassium 4.6; Sodium 136    Lipid Panel    Component Value Date/Time   CHOL 94 09/30/2014 0936   TRIG 144.0 09/30/2014 0936   HDL 32.20* 09/30/2014 0936   CHOLHDL 3 09/30/2014 0936   VLDL 28.8 09/30/2014 0936   LDLCALC 33 09/30/2014 0936      Wt Readings from Last 3 Encounters:  10/02/14 206 lb 6.4 oz (93.622 kg)  06/18/14 205 lb 9.6 oz (93.26 kg)  05/07/14 202 lb 12.8 oz (91.989 kg)       ASSESSMENT AND PLAN:  1. ischemic cardiomyopathy secondary to remote extensive anterior wall myocardial infarction treated with drug-eluting stent to the LAD. 2. Dyslipidemia, with improvement on today's labs 3. Anxiety 4. functioning defibrillator present.  He has not been aware of any shocks  Disposition: Continue same medication. Work harder on weight loss. Recheck in 4 months for followup office visit lipid panel hepatic function panel and basal metabolic panel   Current medicines are reviewed at length with the patient today.  The patient does not have concerns regarding medicines.  The following changes have been made:  no change  Labs/ tests ordered today include:   Orders Placed This Encounter  Procedures  . Lipid panel  . Hepatic function panel  . Basic metabolic  panel      Signed, Cassell Clementhomas Pecolia Marando MD 10/02/2014 12:31 PM    [facility] [address], [place], [place]  [id number] Phone: [phone]; Fax: [phone]

## 2014-10-07 ENCOUNTER — Telehealth: Payer: Self-pay | Admitting: Cardiology

## 2014-10-07 NOTE — Telephone Encounter (Signed)
Thanks

## 2014-10-07 NOTE — Telephone Encounter (Signed)
New problem   Pt was told to call in his BP reading  5.18.16  This morning  116/88

## 2014-10-07 NOTE — Telephone Encounter (Signed)
Spoke with Cathleen CortiSelina Horn (spouse). She states at the recent appt with [name] the patient's diastolic BP was somewhat elevated and [name] asked that they monitor it at least weekly and report in so that he can review for any ongoing or new issues related to hypertension. BP from today was 116/88. Routed to [name] to review. Patient will check BP again Friday and call BP reading in for MD review.

## 2014-10-08 ENCOUNTER — Encounter: Payer: Self-pay | Admitting: Internal Medicine

## 2014-10-08 NOTE — Telephone Encounter (Signed)
Closing Encounter.

## 2014-10-15 ENCOUNTER — Encounter: Payer: Self-pay | Admitting: Cardiology

## 2014-10-16 ENCOUNTER — Ambulatory Visit (INDEPENDENT_AMBULATORY_CARE_PROVIDER_SITE_OTHER): Payer: Medicare Other | Admitting: Internal Medicine

## 2014-10-16 ENCOUNTER — Encounter: Payer: Self-pay | Admitting: Internal Medicine

## 2014-10-16 VITALS — BP 110/64 | HR 77 | Temp 97.8°F | Resp 16 | Ht 66.0 in | Wt 210.0 lb

## 2014-10-16 DIAGNOSIS — I5022 Chronic systolic (congestive) heart failure: Secondary | ICD-10-CM | POA: Diagnosis not present

## 2014-10-16 DIAGNOSIS — E785 Hyperlipidemia, unspecified: Secondary | ICD-10-CM

## 2014-10-16 DIAGNOSIS — I10 Essential (primary) hypertension: Secondary | ICD-10-CM | POA: Diagnosis not present

## 2014-10-16 NOTE — Assessment & Plan Note (Signed)
ICD in place, takes dual antiplatelet therapy as well as statin, coreg, ARB, spironolactone. No signs of exacerbation at this time. Last EF 35%. Follows with cardiology and last labs reviewed during visit.

## 2014-10-16 NOTE — Progress Notes (Signed)
Pre visit review using our clinic review tool, if applicable. No additional management support is needed unless otherwise documented below in the visit note. 

## 2014-10-16 NOTE — Assessment & Plan Note (Signed)
Doing well on coreg, losartan, spironolactone. BP at goal and no CKD. No side effects from medication and last BMP reviewed and without indication for change.

## 2014-10-16 NOTE — Patient Instructions (Signed)
We will have the colon cancer test sent to your house. If it comes back negative there is a 95% chance you do not have any bad polyps or cancer. If it does come back positive there is a 5% chance of false positive but we would want you to have the colonoscopy since we would think that there is a problem.   We are not changing your medicines or taking blood today.   We will see you back in about 6-12 months. If you have any new problems or questions before then please feel free to call the office.   Health Maintenance A healthy lifestyle and preventative care can promote health and wellness.  Maintain regular health, dental, and eye exams.  Eat a healthy diet. Foods like vegetables, fruits, whole grains, low-fat dairy products, and lean protein foods contain the nutrients you need and are low in calories. Decrease your intake of foods high in solid fats, added sugars, and salt. Get information about a proper diet from your health care provider, if necessary.  Regular physical exercise is one of the most important things you can do for your health. Most adults should get at least 150 minutes of moderate-intensity exercise (any activity that increases your heart rate and causes you to sweat) each week. In addition, most adults need muscle-strengthening exercises on 2 or more days a week.   Maintain a healthy weight. The body mass index (BMI) is a screening tool to identify possible weight problems. It provides an estimate of body fat based on height and weight. Your health care provider can find your BMI and can help you achieve or maintain a healthy weight. For males 20 years and older:  A BMI below 18.5 is considered underweight.  A BMI of 18.5 to 24.9 is normal.  A BMI of 25 to 29.9 is considered overweight.  A BMI of 30 and above is considered obese.  Maintain normal blood lipids and cholesterol by exercising and minimizing your intake of saturated fat. Eat a balanced diet with plenty of  fruits and vegetables. Blood tests for lipids and cholesterol should begin at age 48 and be repeated every 5 years. If your lipid or cholesterol levels are high, you are over age 72, or you are at high risk for heart disease, you may need your cholesterol levels checked more frequently.Ongoing high lipid and cholesterol levels should be treated with medicines if diet and exercise are not working.  If you smoke, find out from your health care provider how to quit. If you do not use tobacco, do not start.  Lung cancer screening is recommended for adults aged 55-80 years who are at high risk for developing lung cancer because of a history of smoking. A yearly low-dose CT scan of the lungs is recommended for people who have at least a 30-pack-year history of smoking and are current smokers or have quit within the past 15 years. A pack year of smoking is smoking an average of 1 pack of cigarettes a day for 1 year (for example, a 30-pack-year history of smoking could mean smoking 1 pack a day for 30 years or 2 packs a day for 15 years). Yearly screening should continue until the smoker has stopped smoking for at least 15 years. Yearly screening should be stopped for people who develop a health problem that would prevent them from having lung cancer treatment.  If you choose to drink alcohol, do not have more than 2 drinks per day. One drink  is considered to be 12 oz (360 mL) of beer, 5 oz (150 mL) of wine, or 1.5 oz (45 mL) of liquor.  Avoid the use of street drugs. Do not share needles with anyone. Ask for help if you need support or instructions about stopping the use of drugs.  High blood pressure causes heart disease and increases the risk of stroke. Blood pressure should be checked at least every 1-2 years. Ongoing high blood pressure should be treated with medicines if weight loss and exercise are not effective.  If you are 25-32 years old, ask your health care provider if you should take aspirin to  prevent heart disease.  Diabetes screening involves taking a blood sample to check your fasting blood sugar level. This should be done once every 3 years after age 48 if you are at a normal weight and without risk factors for diabetes. Testing should be considered at a younger age or be carried out more frequently if you are overweight and have at least 1 risk factor for diabetes.  Colorectal cancer can be detected and often prevented. Most routine colorectal cancer screening begins at the age of 84 and continues through age 34. However, your health care provider may recommend screening at an earlier age if you have risk factors for colon cancer. On a yearly basis, your health care provider may provide home test kits to check for hidden blood in the stool. A small camera at the end of a tube may be used to directly examine the colon (sigmoidoscopy or colonoscopy) to detect the earliest forms of colorectal cancer. Talk to your health care provider about this at age 59 when routine screening begins. A direct exam of the colon should be repeated every 5-10 years through age 40, unless early forms of precancerous polyps or small growths are found.  People who are at an increased risk for hepatitis B should be screened for this virus. You are considered at high risk for hepatitis B if:  You were born in a country where hepatitis B occurs often. Talk with your health care provider about which countries are considered high risk.  Your parents were born in a high-risk country and you have not received a shot to protect against hepatitis B (hepatitis B vaccine).  You have HIV or AIDS.  You use needles to inject street drugs.  You live with, or have sex with, someone who has hepatitis B.  You are a man who has sex with other men (MSM).  You get hemodialysis treatment.  You take certain medicines for conditions like cancer, organ transplantation, and autoimmune conditions.  Hepatitis C blood testing is  recommended for all people born from [birth year] through 1963 and any individual with known risk factors for hepatitis C.  Healthy men should no longer receive prostate-specific antigen (PSA) blood tests as part of routine cancer screening. Talk to your health care provider about prostate cancer screening.  Testicular cancer screening is not recommended for adolescents or adult males who have no symptoms. Screening includes self-exam, a health care provider exam, and other screening tests. Consult with your health care provider about any symptoms you have or any concerns you have about testicular cancer.  Practice safe sex. Use condoms and avoid high-risk sexual practices to reduce the spread of sexually transmitted infections (STIs).  You should be screened for STIs, including gonorrhea and chlamydia if:  You are sexually active and are younger than 24 years.  You are older than 24 years, and  your health care provider tells you that you are at risk for this type of infection.  Your sexual activity has changed since you were last screened, and you are at an increased risk for chlamydia or gonorrhea. Ask your health care provider if you are at risk.  If you are at risk of being infected with HIV, it is recommended that you take a prescription medicine daily to prevent HIV infection. This is called pre-exposure prophylaxis (PrEP). You are considered at risk if:  You are a man who has sex with other men (MSM).  You are a heterosexual man who is sexually active with multiple partners.  You take drugs by injection.  You are sexually active with a partner who has HIV.  Talk with your health care provider about whether you are at high risk of being infected with HIV. If you choose to begin PrEP, you should first be tested for HIV. You should then be tested every 3 months for as long as you are taking PrEP.  Use sunscreen. Apply sunscreen liberally and repeatedly throughout the day. You should seek  shade when your shadow is shorter than you. Protect yourself by wearing long sleeves, pants, a wide-brimmed hat, and sunglasses year round whenever you are outdoors.  Tell your health care provider of new moles or changes in moles, especially if there is a change in shape or color. Also, tell your health care provider if a mole is larger than the size of a pencil eraser.  A one-time screening for abdominal aortic aneurysm (AAA) and surgical repair of large AAAs by ultrasound is recommended for men aged 65-75 years who are current or former smokers.  Stay current with your vaccines (immunizations). Document Released: 11/04/2007 Document Revised: 05/13/2013 Document Reviewed: 10/03/2010 [facility] Patient Information 2015 [employer] [place]. This information is not intended to replace advice given to you by your health care provider. Make sure you discuss any questions you have with your health care provider.

## 2014-10-16 NOTE — Assessment & Plan Note (Signed)
Last lipid panel at goal in May, continue lipitor 40 mg daily. No side effects and no LFT abnormality.

## 2014-10-16 NOTE — Progress Notes (Signed)
   Subjective:    Patient ID: Spencer Horn, male    DOB: 12/10/1950, 64 y.o.   MRN: [id number]  HPI The patient is a 64 YO man who is coming in to talk about his chronic medical problems. He is a new patient. Please see A/P for status and treatment of chronic medical problems. No new problems. Denies chest pains, SOB. He does follow with his cardiologist and most of his medical problems are managed there. He has never had colon cancer screening but does the stool cards every year. Walks 30 minutes daily without stopping. Still not smoking and no intention to resume.   [facility], [facility], social history reviewed and updated.   Review of Systems  Constitutional: Negative for fever, activity change, appetite change, fatigue and unexpected weight change.  HENT: Negative.   Eyes: Negative.   Respiratory: Negative for cough, chest tightness, shortness of breath and wheezing.   Cardiovascular: Negative for chest pain, palpitations and leg swelling.  Gastrointestinal: Negative for nausea, abdominal pain, diarrhea, constipation and abdominal distention.  Musculoskeletal: Negative.   Skin: Negative.   Neurological: Negative.   Psychiatric/Behavioral: Negative.       Objective:   Physical Exam  Constitutional: He is oriented to person, place, and time. He appears well-developed and well-nourished.  Overweight  HENT:  Head: Normocephalic and atraumatic.  Eyes: EOM are normal.  Neck: Normal range of motion.  Cardiovascular: Normal rate and regular rhythm.   Pulmonary/Chest: Effort normal and breath sounds normal. No respiratory distress. He has no wheezes. He has no rales.  Abdominal: Soft. Bowel sounds are normal. He exhibits no distension. There is no tenderness. There is no rebound.  Musculoskeletal: He exhibits no edema.  Neurological: He is alert and oriented to person, place, and time. Coordination normal.  Skin: Skin is warm and dry.  Psychiatric: He has a normal mood and affect.   Filed Vitals:    10/16/14 1056  BP: 110/64  Pulse: 77  Temp: 97.8 F (36.6 C)  TempSrc: Oral  Resp: 16  Height: 5\' 6"  (1.676 m)  Weight: 210 lb (95.255 kg)  SpO2: 94%      Assessment & Plan:

## 2014-11-11 ENCOUNTER — Other Ambulatory Visit: Payer: Self-pay | Admitting: Cardiology

## 2014-11-12 ENCOUNTER — Other Ambulatory Visit: Payer: Self-pay

## 2014-11-12 MED ORDER — ATORVASTATIN CALCIUM 40 MG PO TABS: 40.0000 mg | ORAL_TABLET | Freq: Every day | ORAL | Status: DC

## 2014-12-07 ENCOUNTER — Other Ambulatory Visit: Payer: Self-pay | Admitting: Cardiology

## 2014-12-08 LAB — COLOGUARD

## 2014-12-12 ENCOUNTER — Other Ambulatory Visit: Payer: Self-pay | Admitting: Cardiology

## 2014-12-15 ENCOUNTER — Other Ambulatory Visit: Payer: Self-pay | Admitting: Cardiology

## 2014-12-15 LAB — COLOGUARD: COLOGUARD: NEGATIVE

## 2014-12-16 ENCOUNTER — Ambulatory Visit (INDEPENDENT_AMBULATORY_CARE_PROVIDER_SITE_OTHER): Payer: Medicare Other | Admitting: *Deleted

## 2014-12-16 ENCOUNTER — Other Ambulatory Visit: Payer: Self-pay

## 2014-12-16 DIAGNOSIS — I255 Ischemic cardiomyopathy: Secondary | ICD-10-CM | POA: Diagnosis not present

## 2014-12-16 LAB — CUP PACEART INCLINIC DEVICE CHECK
Brady Statistic RV Percent Paced: 0 %
HighPow Impedance: 82.125
Lead Channel Pacing Threshold Pulse Width: 0.5 ms
Lead Channel Sensing Intrinsic Amplitude: 12 mV
Lead Channel Setting Pacing Amplitude: 2.5 V
MDC IDC MSMT BATTERY REMAINING LONGEVITY: 69.6 mo
MDC IDC MSMT LEADCHNL RV IMPEDANCE VALUE: 362.5 Ohm
MDC IDC MSMT LEADCHNL RV PACING THRESHOLD AMPLITUDE: 1.25 V
MDC IDC SESS DTM: 20160727105624
MDC IDC SET LEADCHNL RV PACING PULSEWIDTH: 0.5 ms
MDC IDC SET LEADCHNL RV SENSING SENSITIVITY: 0.5 mV
Pulse Gen Serial Number: 819170
Zone Setting Detection Interval: 320 ms

## 2014-12-16 MED ORDER — SPIRONOLACTONE 25 MG PO TABS: 12.5000 mg | ORAL_TABLET | Freq: Every day | ORAL | Status: DC

## 2014-12-16 NOTE — Progress Notes (Signed)
ICD check in clinic Battery longevity 5.8 years No episodes recorded No changes made Merlin 03-18-15 and ROV in January 2017 with GT

## 2014-12-30 ENCOUNTER — Encounter: Payer: Self-pay | Admitting: Internal Medicine

## 2015-01-12 ENCOUNTER — Other Ambulatory Visit: Payer: Self-pay | Admitting: Cardiology

## 2015-01-19 ENCOUNTER — Encounter: Payer: Self-pay | Admitting: Internal Medicine

## 2015-01-19 ENCOUNTER — Ambulatory Visit (INDEPENDENT_AMBULATORY_CARE_PROVIDER_SITE_OTHER): Payer: Medicare Other | Admitting: Internal Medicine

## 2015-01-19 ENCOUNTER — Encounter: Payer: Self-pay | Admitting: *Deleted

## 2015-01-19 VITALS — BP 122/76 | HR 71 | Ht 65.5 in | Wt 203.6 lb

## 2015-01-19 DIAGNOSIS — I255 Ischemic cardiomyopathy: Secondary | ICD-10-CM

## 2015-01-19 DIAGNOSIS — I5022 Chronic systolic (congestive) heart failure: Secondary | ICD-10-CM | POA: Diagnosis not present

## 2015-01-19 DIAGNOSIS — Z95810 Presence of automatic (implantable) cardiac defibrillator: Secondary | ICD-10-CM | POA: Diagnosis not present

## 2015-01-19 DIAGNOSIS — Z006 Encounter for examination for normal comparison and control in clinical research program: Secondary | ICD-10-CM

## 2015-01-19 LAB — CUP PACEART INCLINIC DEVICE CHECK
Battery Remaining Longevity: 68.4 mo
Brady Statistic RV Percent Paced: 0 %
Date Time Interrogation Session: 20160830131041
HighPow Impedance: 96.75 Ohm
Lead Channel Impedance Value: 425 Ohm
Lead Channel Pacing Threshold Amplitude: 1 V
Lead Channel Pacing Threshold Pulse Width: 0.5 ms
Lead Channel Setting Pacing Pulse Width: 0.5 ms
Lead Channel Setting Sensing Sensitivity: 0.5 mV
MDC IDC MSMT LEADCHNL RV PACING THRESHOLD AMPLITUDE: 1 V
MDC IDC MSMT LEADCHNL RV PACING THRESHOLD PULSEWIDTH: 0.5 ms
MDC IDC MSMT LEADCHNL RV SENSING INTR AMPL: 12 mV
MDC IDC SET LEADCHNL RV PACING AMPLITUDE: 2.5 V
Pulse Gen Serial Number: 819170
Zone Setting Detection Interval: 320 ms

## 2015-01-19 NOTE — Progress Notes (Signed)
Analyze ST Final Research Visit- Pt seen in device clinic. Device checked by industry and research. No ST alerts. Research ST thresholds turned off per protocol. Pt released from research study Merlin and transferred to [facility] for ongoing remote monitoring. EKG WNL reviewed by Dr. Ladona Ridgel. BP 122/76 HR 71.

## 2015-01-19 NOTE — Assessment & Plan Note (Signed)
He denies anginal symptoms. He remains active. No change in meds.

## 2015-01-19 NOTE — Progress Notes (Signed)
HPI [name] returns today for followup. He is a very pleasant 64 year old man with an ischemic cardiomyopathy, status post anterior myocardial infarction, with chronic systolic heart failure, class 2, ejection fraction 30%. He is status post ICD implantation. In the interim, he has done well. He works out in his yard regularly. He denies chest pain, syncope, or peripheral edema. He has had a problem with obesity. He is trying a Architectural technologist. No Known Allergies   Current Outpatient Prescriptions  Medication Sig Dispense Refill  . ALPRAZolam (XANAX) 0.25 MG tablet Take 1 tablet (0.25 mg total) by mouth at bedtime as needed for anxiety. 30 tablet 5  . aspirin 81 MG tablet Take 81 mg by mouth daily.      [name] atorvastatin (LIPITOR) 40 MG tablet Take 1 tablet (40 mg total) by mouth daily. 90 tablet 3  . carvedilol (COREG) 6.25 MG tablet TAKE ONE-HALF TABLET BY MOUTH TWICE DAILY 30 tablet 0  . EFFIENT 10 MG TABS tablet TAKE ONE TABLET BY MOUTH ONCE DAILY 30 tablet 3  . losartan (COZAAR) 25 MG tablet TAKE ONE TABLET BY MOUTH ONCE DAILY 90 tablet 0  . Multiple Vitamin (MULTIVITAMIN) tablet Take 1 tablet by mouth daily.    . nitroGLYCERIN (NITROSTAT) 0.4 MG SL tablet Place 0.4 mg under the tongue every 5 (five) minutes as needed for chest pain (for chest pain).    [name] spironolactone (ALDACTONE) 25 MG tablet Take 0.5 tablets (12.5 mg total) by mouth daily. 45 tablet 3   No current facility-administered medications for this visit.     Past Medical History  Diagnosis Date  . History of acute anterior wall MI   . Dyslipidemia   . Ventricular dysfunction 07/07/2010    Severe left ventricular dysfunction with ejection fraction of 30-35%  . Personal history of MRSA (methicillin resistant Staphylococcus aureus)   . Hyperlipidemia   . Coronary artery disease   . Ischemic cardiomyopathy   . Tobacco abuse   . ICD (implantable cardiac defibrillator) in place June 2012    ROS:   All systems reviewed  and negative except as noted in the HPI.   Past Surgical History  Procedure Laterality Date  . Cardiac catheterization  07/07/2010    DES to proximal LAD  . Cardiac catheterization  2004  . Icd implant  June 2012     Family History  Problem Relation Age of Onset  . Hypertension Mother   . Cancer Mother     bone  . Hyperlipidemia Father   . Cancer Father     bladder  . Diabetes Father      Social History   Social History  . Marital Status: Married    Spouse Name: N/A  . Number of Children: N/A  . Years of Education: N/A   Occupational History  . Not on file.   Social History Main Topics  . Smoking status: Former Smoker    Types: Cigarettes  . Smokeless tobacco: Not on file  . Alcohol Use: No  . Drug Use: No  . Sexual Activity: Yes   Other Topics Concern  . Not on file   Social History Narrative     BP 122/76 mmHg  Pulse 71  Ht 5' 5.5" (1.664 m)  Wt 203 lb 9.6 oz (92.352 kg)  BMI 33.35 kg/m2  Physical Exam:  Well appearing obese, middle-aged man,NAD HEENT: Unremarkable Neck:  No JVD, no thyromegally Back:  No CVA tenderness Lungs:  Clear with no wheezes, rales, or  rhonchi. HEART:  Regular rate rhythm, no murmurs, no rubs, no clicks Abd:  soft, positive bowel sounds, no organomegally, no rebound, no guarding Ext:  2 plus pulses, no edema, no cyanosis, no clubbing Skin:  No rashes no nodules Neuro:  CN II through XII intact, motor grossly intact  EKG - sinus rhythm with septal MI  DEVICE  Normal device function.  See PaceArt for details.   Assess/Plan:

## 2015-01-19 NOTE — Assessment & Plan Note (Signed)
His symptoms are class 2. No change in medical therapy.  

## 2015-01-19 NOTE — Assessment & Plan Note (Signed)
His [employer] ICD is working normally. Will recheck in several months.  

## 2015-01-19 NOTE — Patient Instructions (Signed)
Medication Instructions:  Your physician recommends that you continue on your current medications as directed. Please refer to the Current Medication list given to you today.   Labwork: None ordered  Testing/Procedures: None ordered  Follow-Up: Your physician wants you to follow-up in: 12 months with Dr Taylor You will receive a reminder letter in the mail two months in advance. If you don't receive a letter, please call our office to schedule the follow-up appointment.   Remote monitoring is used to monitor your ICD from home. This monitoring reduces the number of office visits required to check your device to one time per year. It allows us to keep an eye on the functioning of your device to ensure it is working properly. You are scheduled for a device check from home on 04/20/15. You may send your transmission at any time that day. If you have a wireless device, the transmission will be sent automatically. After your physician reviews your transmission, you will receive a postcard with your next transmission date.    Any Other Special Instructions Will Be Listed Below (If Applicable).   

## 2015-01-20 NOTE — Addendum Note (Signed)
Addended by: Reesa Chew on: 01/20/2015 05:49 PM   Modules accepted: Orders

## 2015-01-27 ENCOUNTER — Encounter: Payer: Self-pay | Admitting: *Deleted

## 2015-01-27 ENCOUNTER — Other Ambulatory Visit (INDEPENDENT_AMBULATORY_CARE_PROVIDER_SITE_OTHER): Payer: Medicare Other | Admitting: *Deleted

## 2015-01-27 DIAGNOSIS — E785 Hyperlipidemia, unspecified: Secondary | ICD-10-CM | POA: Diagnosis not present

## 2015-01-27 LAB — BASIC METABOLIC PANEL
BUN: 21 mg/dL (ref 6–23)
CO2: 28 mEq/L (ref 19–32)
Calcium: 9 mg/dL (ref 8.4–10.5)
Chloride: 103 mEq/L (ref 96–112)
Creatinine, Ser: 1.06 mg/dL (ref 0.40–1.50)
GFR: 75.09 mL/min (ref >60.00)
Glucose, Bld: 98 mg/dL (ref 70–99)
POTASSIUM: 4.2 meq/L (ref 3.5–5.1)
Sodium: 137 mEq/L (ref 135–145)

## 2015-01-27 LAB — LIPID PANEL
CHOLESTEROL: 90 mg/dL (ref 0–200)
HDL: 28.9 mg/dL — ABNORMAL LOW (ref >39.00)
LDL CALC: 36 mg/dL (ref 0–99)
NonHDL: 60.89
Total CHOL/HDL Ratio: 3
Triglycerides: 125 mg/dL (ref 0.0–149.0)
VLDL: 25 mg/dL (ref 0.0–40.0)

## 2015-01-27 LAB — HEPATIC FUNCTION PANEL
ALT: 12 U/L (ref 0–53)
AST: 18 U/L (ref 0–37)
Albumin: 3.9 g/dL (ref 3.5–5.2)
Alkaline Phosphatase: 80 U/L (ref 39–117)
BILIRUBIN DIRECT: 0.2 mg/dL (ref 0.0–0.3)
TOTAL PROTEIN: 7.1 g/dL (ref 6.0–8.3)
Total Bilirubin: 0.7 mg/dL (ref 0.2–1.2)

## 2015-01-27 NOTE — Progress Notes (Signed)
Quick Note:  Please make copy of labs for patient visit. ______ 

## 2015-02-01 ENCOUNTER — Encounter: Payer: Self-pay | Admitting: Cardiology

## 2015-02-01 ENCOUNTER — Ambulatory Visit (INDEPENDENT_AMBULATORY_CARE_PROVIDER_SITE_OTHER): Payer: Medicare Other | Admitting: Cardiology

## 2015-02-01 VITALS — BP 110/68 | HR 75 | Ht 64.0 in | Wt 202.0 lb

## 2015-02-01 DIAGNOSIS — I2583 Coronary atherosclerosis due to lipid rich plaque: Secondary | ICD-10-CM

## 2015-02-01 DIAGNOSIS — E785 Hyperlipidemia, unspecified: Secondary | ICD-10-CM | POA: Diagnosis not present

## 2015-02-01 DIAGNOSIS — I2510 Atherosclerotic heart disease of native coronary artery without angina pectoris: Secondary | ICD-10-CM

## 2015-02-01 DIAGNOSIS — I5022 Chronic systolic (congestive) heart failure: Secondary | ICD-10-CM

## 2015-02-01 NOTE — Progress Notes (Signed)
Cardiology Office Note   Date:  02/01/2015   ID:  Spencer Horn, DOB 1951-04-08, MRN [id number]  PCP:  Judie Bonus, MD  Cardiologist: Cassell Clement MD  No chief complaint on file.     History of Present Illness: Spencer Horn is a 64 y.o. male y.o. male who presents for scheduled four-month follow-up visit.  This pleasant 64 year old gentleman is seen for a scheduled four-month followup office visit. He has a history of severe ischemic heart disease. He has an ischemic cardiomyopathy. He had his first heart attack in 2004. In 2011 he had a normal treadmill Cardiolite stress test. In February 2012 he had an acute anterior wall myocardial infarction treated with emergency cardiac catheterization and a drug-eluting stent. He was left with akinesis of his entire anterior apical myocardium and after wearing a life chest went on to have the implantation of a defibrillator on 11/10/10. He had an echocardiogram done prior to implantation of the defibrillator which showed an ejection fraction of only 35% on 10/06/10. He is unable to work and is retired on disability. 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 He has been using his treadmill at home and walking for 30 minutes a day. He is not having any exertional dyspnea or chest pain. His weight is back up 1 pound since last visit. Since last visit he has had some problems with anxiety. The patient snores. His wife notes that occasionally his breathing is regular during the night. The patient has declined a sleep study in the past. He has been physically active.  He's been doing a lot of chores in the yard and power washing his sighting etc.  He and his wife also try to walk 30 minutes each evening for exercise. He is on dual antiplatelets therapy.  He is not having any bleeding problems. He has not had any shocks from his defibrillator. He is on the Mediterranean diet and  has lost 8 pounds since last visit.  Past Medical History  Diagnosis Date  . History of acute anterior wall MI   . Dyslipidemia   . Ventricular dysfunction 07/07/2010    Severe left ventricular dysfunction with ejection fraction of 30-35%  . Personal history of MRSA (methicillin resistant Staphylococcus aureus)   . Hyperlipidemia   . Coronary artery disease   . Ischemic cardiomyopathy   . Tobacco abuse   . ICD (implantable cardiac defibrillator) in place June 2012    Past Surgical History  Procedure Laterality Date  . Cardiac catheterization  07/07/2010    DES to proximal LAD  . Cardiac catheterization  2004  . Ep implantable device  June 2012     Current Outpatient Prescriptions  Medication Sig Dispense Refill  . ALPRAZolam (XANAX) 0.25 MG tablet Take 1 tablet (0.25 mg total) by mouth at bedtime as needed for anxiety. 30 tablet 5  . aspirin 81 MG tablet Take 81 mg by mouth daily.      [name] atorvastatin (LIPITOR) 40 MG tablet Take 1 tablet (40 mg total) by mouth daily. 90 tablet 3  . carvedilol (COREG) 6.25 MG tablet TAKE ONE-HALF TABLET BY MOUTH TWICE DAILY 30 tablet 0  . EFFIENT 10 MG TABS tablet TAKE ONE TABLET BY MOUTH ONCE DAILY 30 tablet 3  . losartan (COZAAR) 25 MG tablet TAKE ONE TABLET BY MOUTH ONCE DAILY 90 tablet 0  . Multiple Vitamin (MULTIVITAMIN) tablet Take 1 tablet  by mouth daily.    . nitroGLYCERIN (NITROSTAT) 0.4 MG SL tablet Place 0.4 mg under the tongue every 5 (five) minutes as needed for chest pain (for chest pain).    [name] spironolactone (ALDACTONE) 25 MG tablet Take 0.5 tablets (12.5 mg total) by mouth daily. 45 tablet 3   No current facility-administered medications for this visit.    Allergies:   Review of patient's allergies indicates no known allergies.    Social History:  The patient  reports that he has quit smoking. His smoking use included Cigarettes. He does not have any smokeless tobacco history on file. He reports that he does not drink alcohol  or use illicit drugs.   Family History:  The patient's family history includes Bladder Cancer in his father; Bone cancer in his mother; Diabetes in his father; Hyperlipidemia in his father; Hypertension in his mother.    ROS:  Please see the history of present illness.   Otherwise, review of systems are positive for none.   All other systems are reviewed and negative.    PHYSICAL EXAM: VS:  BP 110/68 mmHg  Pulse 75  Ht  (1.626 m)  Wt 202 lb (91.627 kg)  BMI 34.66 kg/m2 , BMI Body mass index is 34.66 kg/(m^2). GEN: Well nourished, well developed, in no acute distress HEENT: normal Neck: no JVD, carotid bruits, or masses Cardiac: RRR; no murmurs, rubs, or gallops,no edema  Respiratory:  clear to auscultation bilaterally, normal work of breathing GI: soft, nontender, nondistended, + BS MS: no deformity or atrophy Skin: warm and dry, no rash Neuro:  Strength and sensation are intact Psych: euthymic mood, full affect   EKG:  EKG is not ordered today.    Recent Labs: 01/27/2015: ALT 12; BUN 21; Creatinine, Ser 1.06; Potassium 4.2; Sodium 137    Lipid Panel    Component Value Date/Time   CHOL 90 01/27/2015 0803   TRIG 125.0 01/27/2015 0803   HDL 28.90* 01/27/2015 0803   CHOLHDL 3 01/27/2015 0803   VLDL 25.0 01/27/2015 0803   LDLCALC 36 01/27/2015 0803      Wt Readings from Last 3 Encounters:  02/01/15 202 lb (91.627 kg)  01/19/15 203 lb 9.6 oz (92.352 kg)  10/16/14 210 lb (95.255 kg)        ASSESSMENT AND PLAN:  1. ischemic cardiomyopathy secondary to remote extensive anterior wall myocardial infarction treated with drug-eluting stent to the LAD.  On dual antiplatelets therapy.  No bleeding. 2. Dyslipidemia, with improvement on today's labs 3. Anxiety 4. functioning defibrillator present. He has not been aware of any shocks  Disposition: Continue same medication.Continue Mediterranean diet. Recheck in 4 months for followup office visit lipid panel hepatic  function panel and basal metabolic panel   Current medicines are reviewed at length with the patient today.  The patient does not have concerns regarding medicines.  The following changes have been made:  no change  Labs/ tests ordered today include:  No orders of the defined types were placed in this encounter.      Karie Schwalbe MD 02/01/2015 10:20 AM    [facility] [address] [place], [place], [name]  [address] Phone: [phone]; Fax: [phone]

## 2015-02-01 NOTE — Patient Instructions (Signed)
Medication Instructions:  Your physician recommends that you continue on your current medications as directed. Please refer to the Current Medication list given to you today.  Labwork: none  Testing/Procedures: none  Follow-Up: Your physician wants you to follow-up in: 4 months with fasting labs (lp/bmet/hfp)  You will receive a reminder letter in the mail two months in advance. If you don't receive a letter, please call our office to schedule the follow-up appointment.    

## 2015-02-05 ENCOUNTER — Ambulatory Visit (INDEPENDENT_AMBULATORY_CARE_PROVIDER_SITE_OTHER): Payer: Medicare Other

## 2015-02-05 DIAGNOSIS — Z23 Encounter for immunization: Secondary | ICD-10-CM

## 2015-02-08 ENCOUNTER — Other Ambulatory Visit: Payer: Self-pay | Admitting: *Deleted

## 2015-02-08 DIAGNOSIS — I10 Essential (primary) hypertension: Secondary | ICD-10-CM

## 2015-02-08 DIAGNOSIS — E785 Hyperlipidemia, unspecified: Secondary | ICD-10-CM

## 2015-02-11 ENCOUNTER — Other Ambulatory Visit: Payer: Self-pay | Admitting: Cardiology

## 2015-03-09 ENCOUNTER — Other Ambulatory Visit: Payer: Self-pay | Admitting: Cardiology

## 2015-04-05 ENCOUNTER — Other Ambulatory Visit: Payer: Self-pay | Admitting: Cardiology

## 2015-04-20 ENCOUNTER — Ambulatory Visit (INDEPENDENT_AMBULATORY_CARE_PROVIDER_SITE_OTHER): Payer: Medicare Other | Admitting: *Deleted

## 2015-04-20 DIAGNOSIS — I255 Ischemic cardiomyopathy: Secondary | ICD-10-CM | POA: Diagnosis not present

## 2015-04-21 NOTE — Progress Notes (Signed)
Remote ICD transmission.   

## 2015-04-29 LAB — CUP PACEART REMOTE DEVICE CHECK
Battery Remaining Percentage: 59 %
Battery Voltage: 2.96 V
HIGH POWER IMPEDANCE MEASURED VALUE: 92 Ohm
HighPow Impedance: 92 Ohm
Implantable Lead Implant Date: 20120621
Implantable Lead Location: 753860
Lead Channel Pacing Threshold Pulse Width: 0.5 ms
Lead Channel Sensing Intrinsic Amplitude: 12 mV
Lead Channel Setting Pacing Pulse Width: 0.5 ms
MDC IDC MSMT BATTERY REMAINING LONGEVITY: 68 mo
MDC IDC MSMT LEADCHNL RV IMPEDANCE VALUE: 430 Ohm
MDC IDC MSMT LEADCHNL RV PACING THRESHOLD AMPLITUDE: 1 V
MDC IDC SESS DTM: 20161129073036
MDC IDC SET LEADCHNL RV PACING AMPLITUDE: 2.5 V
MDC IDC SET LEADCHNL RV SENSING SENSITIVITY: 0.5 mV
MDC IDC STAT BRADY RV PERCENT PACED: 1 %
Pulse Gen Serial Number: 819170

## 2015-04-30 ENCOUNTER — Encounter: Payer: Self-pay | Admitting: Cardiology

## 2015-06-02 ENCOUNTER — Other Ambulatory Visit: Payer: Self-pay | Admitting: Cardiology

## 2015-06-07 ENCOUNTER — Other Ambulatory Visit (INDEPENDENT_AMBULATORY_CARE_PROVIDER_SITE_OTHER): Payer: Medicare Other | Admitting: *Deleted

## 2015-06-07 ENCOUNTER — Ambulatory Visit: Payer: Medicare Other | Admitting: Cardiology

## 2015-06-07 DIAGNOSIS — I2510 Atherosclerotic heart disease of native coronary artery without angina pectoris: Secondary | ICD-10-CM

## 2015-06-07 DIAGNOSIS — I10 Essential (primary) hypertension: Secondary | ICD-10-CM

## 2015-06-07 DIAGNOSIS — I2583 Coronary atherosclerosis due to lipid rich plaque: Principal | ICD-10-CM

## 2015-06-07 LAB — BASIC METABOLIC PANEL
BUN: 14 mg/dL (ref 7–25)
CALCIUM: 8.9 mg/dL (ref 8.6–10.3)
CO2: 29 mmol/L (ref 20–31)
Chloride: 100 mmol/L (ref 98–110)
Creat: 0.99 mg/dL (ref 0.70–1.25)
GLUCOSE: 92 mg/dL (ref 65–99)
POTASSIUM: 4 mmol/L (ref 3.5–5.3)
SODIUM: 139 mmol/L (ref 135–146)

## 2015-06-07 LAB — LIPID PANEL
CHOL/HDL RATIO: 2.8 ratio (ref <=5.0)
Cholesterol: 84 mg/dL — ABNORMAL LOW (ref 125–200)
HDL: 30 mg/dL — ABNORMAL LOW (ref >=40)
LDL Cholesterol: 34 mg/dL (ref <130)
Triglycerides: 100 mg/dL (ref <150)
VLDL: 20 mg/dL (ref <30)

## 2015-06-07 LAB — HEPATIC FUNCTION PANEL
ALT: 13 U/L (ref 9–46)
AST: 20 U/L (ref 10–35)
Albumin: 3.6 g/dL (ref 3.6–5.1)
Alkaline Phosphatase: 73 U/L (ref 40–115)
BILIRUBIN DIRECT: 0.1 mg/dL (ref <=0.2)
BILIRUBIN TOTAL: 0.6 mg/dL (ref 0.2–1.2)
Indirect Bilirubin: 0.5 mg/dL (ref 0.2–1.2)
Total Protein: 6.9 g/dL (ref 6.1–8.1)

## 2015-06-07 NOTE — Addendum Note (Signed)
Addended by: Tonita PhoenixBOWDEN, Anberlin Diez K on: 06/07/2015 09:14 AM   Modules accepted: Orders

## 2015-06-07 NOTE — Addendum Note (Signed)
Addended by: Jaelani Posa K on: 06/07/2015 09:14 AM   Modules accepted: Orders  

## 2015-06-08 NOTE — Progress Notes (Signed)
Quick Note:  Please make copy of labs for patient visit. ______ 

## 2015-06-09 ENCOUNTER — Encounter: Payer: Self-pay | Admitting: Cardiology

## 2015-06-09 ENCOUNTER — Ambulatory Visit (INDEPENDENT_AMBULATORY_CARE_PROVIDER_SITE_OTHER): Payer: Medicare Other | Admitting: Cardiology

## 2015-06-09 VITALS — BP 110/78 | HR 84 | Ht 66.0 in | Wt 204.4 lb

## 2015-06-09 DIAGNOSIS — E785 Hyperlipidemia, unspecified: Secondary | ICD-10-CM

## 2015-06-09 DIAGNOSIS — I5022 Chronic systolic (congestive) heart failure: Secondary | ICD-10-CM | POA: Diagnosis not present

## 2015-06-09 DIAGNOSIS — I255 Ischemic cardiomyopathy: Secondary | ICD-10-CM | POA: Diagnosis not present

## 2015-06-09 MED ORDER — ATORVASTATIN CALCIUM 40 MG PO TABS: 40.0000 mg | ORAL_TABLET | Freq: Every day | ORAL | Status: DC

## 2015-06-09 MED ORDER — CARVEDILOL 6.25 MG PO TABS
3.1250 mg | ORAL_TABLET | Freq: Two times a day (BID) | ORAL | Status: DC
Start: 2015-06-09 — End: 2016-06-21

## 2015-06-09 MED ORDER — SPIRONOLACTONE 25 MG PO TABS: 12.5000 mg | ORAL_TABLET | Freq: Every day | ORAL | Status: DC

## 2015-06-09 MED ORDER — LOSARTAN POTASSIUM 25 MG PO TABS: 25.0000 mg | ORAL_TABLET | Freq: Every day | ORAL | Status: DC

## 2015-06-09 NOTE — Progress Notes (Signed)
Cardiology Office Note   Date:  06/09/2015   ID:  Spencer Horn, DOB 1951/01/16, MRN [id number]  PCP:  Myrlene Broker, MD  Cardiologist: Cassell Clement MD  Chief Complaint  Patient presents with  . routine follow up    Patient denies any chest pain, shortness of breath, or le edema      History of Present Illness: Spencer Horn is a 65 y.o. male who presents for scheduled four-month visit  This pleasant 65 year old gentleman is seen for a scheduled four-month followup office visit. He has a history of severe ischemic heart disease. He has an ischemic cardiomyopathy. He had his first heart attack in 2004. In 2011 he had a normal treadmill Cardiolite stress test. In February 2012 he had an acute anterior wall myocardial infarction treated with emergency cardiac catheterization and a drug-eluting stent. He was left with akinesis of his entire anterior apical myocardium and after wearing a lifevest he went on to have the implantation of a defibrillator on 11/10/10. He had an echocardiogram done prior to implantation of the defibrillator which showed an ejection fraction of only 35% on 10/06/10. He is unable to work and is retired on disability. 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 He has been using his treadmill at home and walking for 30 minutes a day. He is not having any exertional dyspnea or chest pain. His weight is back up 2 pounds since last visit. The patient is on dual antiplatelet therapy.  He is not having any bleeding problems. He has not had any shocks from his defibrillator. He is on the Mediterranean diet.  Both the patient and his wife have had recent chest colds and they have not been as careful with her diet recently. The patient had some chest discomfort on Christmas Eve and took a single sublingual nitroglycerin with relief.  He has had no recurrence. His last echocardiogram in 2014  showed an ejection fraction of 35%. The patient has had some left arm and shoulder pain with limitation of motion suggesting possible primary orthopedic problem.  If symptoms worsen, he will need orthopedic evaluation.  He may have problem with left rotator cuff  Past Medical History  Diagnosis Date  . History of acute anterior wall MI   . Dyslipidemia   . Ventricular dysfunction 07/07/2010    Severe left ventricular dysfunction with ejection fraction of 30-35%  . Personal history of MRSA (methicillin resistant Staphylococcus aureus)   . Hyperlipidemia   . Coronary artery disease   . Ischemic cardiomyopathy   . Tobacco abuse   . ICD (implantable cardiac defibrillator) in place June 2012    Past Surgical History  Procedure Laterality Date  . Cardiac catheterization  07/07/2010    DES to proximal LAD  . Cardiac catheterization  2004  . Ep implantable device  June 2012     Current Outpatient Prescriptions  Medication Sig Dispense Refill  . ALPRAZolam (XANAX) 0.25 MG tablet Take 1 tablet (0.25 mg total) by mouth at bedtime as needed for anxiety. 30 tablet 5  . aspirin 81 MG tablet Take 81 mg by mouth daily.      [name] atorvastatin (LIPITOR) 40 MG tablet Take 1 tablet (40 mg total) by mouth daily. 90 tablet 3  . carvedilol (COREG) 6.25 MG tablet TAKE ONE-HALF TABLET BY MOUTH TWICE DAILY 30 tablet 7  . EFFIENT 10 MG TABS tablet TAKE  ONE TABLET BY MOUTH ONCE DAILY 30 tablet 5  . losartan (COZAAR) 25 MG tablet TAKE ONE TABLET BY MOUTH ONCE DAILY 90 tablet 3  . Multiple Vitamin (MULTIVITAMIN) tablet Take 1 tablet by mouth daily.    . nitroGLYCERIN (NITROSTAT) 0.4 MG SL tablet Place 0.4 mg under the tongue every 5 (five) minutes as needed for chest pain (for chest pain).    [name] spironolactone (ALDACTONE) 25 MG tablet Take 0.5 tablets (12.5 mg total) by mouth daily. 45 tablet 3   No current facility-administered medications for this visit.    Allergies:   Review of patient's allergies  indicates no known allergies.    Social History:  The patient  reports that he has quit smoking. His smoking use included Cigarettes. He does not have any smokeless tobacco history on file. He reports that he does not drink alcohol or use illicit drugs.   Family History:  The patient's family history includes Bladder Cancer in his father; Bone cancer in his mother; Diabetes in his father; Hyperlipidemia in his father; Hypertension in his mother.    ROS:  Please see the history of present illness.   Otherwise, review of systems are positive for none.   All other systems are reviewed and negative.    PHYSICAL EXAM: VS:  BP 110/78 mmHg  Pulse 84  Ht  (1.676 m)  Wt 204 lb 6.4 oz (92.715 kg)  BMI 33.01 kg/m2 , BMI Body mass index is 33.01 kg/(m^2). GEN: Well nourished, well developed, in no acute distress HEENT: normal Neck: no JVD, carotid bruits, or masses Cardiac: RRR; no murmurs, rubs, or gallops,no edema  Respiratory:  clear to auscultation bilaterally, normal work of breathing GI: soft, nontender, nondistended, + BS MS: no deformity or atrophy Skin: warm and dry, no rash Neuro:  Strength and sensation are intact Psych: euthymic mood, full affect   EKG:  EKG is not ordered today.    Recent Labs: 06/07/2015: ALT 13; BUN 14; Creat 0.99; Potassium 4.0; Sodium 139    Lipid Panel    Component Value Date/Time   CHOL 84* 06/07/2015 0914   TRIG 100 06/07/2015 0914   HDL 30* 06/07/2015 0914   CHOLHDL 2.8 06/07/2015 0914   VLDL 20 06/07/2015 0914   LDLCALC 34 06/07/2015 0914      Wt Readings from Last 3 Encounters:  06/09/15 204 lb 6.4 oz (92.715 kg)  02/01/15 202 lb (91.627 kg)  01/19/15 203 lb 9.6 oz (92.352 kg)         ASSESSMENT AND PLAN:  1. ischemic cardiomyopathy secondary to remote extensive anterior wall myocardial infarction treated with drug-eluting stent to the LAD. On dual antiplatelets therapy. No bleeding. 2. Dyslipidemia.  We reviewed his  recent laboratory studies which are excellent 3. Anxiety 4. functioning defibrillator present. He has not been aware of any shocks  Disposition: Continue same medication.Continue Mediterranean diet. Recheck in 4 months for followup office visit and EKG with Dr. Duke Salvia. We will get an echocardiogram to update assessment of left ventricular function.  Current medicines are reviewed at length with the patient today.  The patient does not have concerns regarding medicines.  The following changes have been made:  no change  Labs/ tests ordered today include:   Orders Placed This Encounter  Procedures  . ECHOCARDIOGRAM COMPLETE     Signed, Cassell Clement MD 06/09/2015 11:00 AM    [facility] [address] [place], [place], [name]  [address] Phone: [phone]; Fax: [phone]    

## 2015-06-09 NOTE — Patient Instructions (Signed)
Medication Instructions:  Your physician recommends that you continue on your current medications as directed. Please refer to the Current Medication list given to you today.  Labwork: none  Testing/Procedures: Your physician has requested that you have an echocardiogram. Echocardiography is a painless test that uses sound waves to create images of your heart. It provides your doctor with information about the size and shape of your heart and how well your heart's chambers and valves are working. This procedure takes approximately one hour. There are no restrictions for this procedure.  Follow-Up: Your physician wants you to follow-up in: 4 month ov/ekg with Dr Fara Chute will receive a reminder letter in the mail two months in advance. If you don't receive a letter, please call our office to schedule the follow-up appointment.  If you need a refill on your cardiac medications before your next appointment, please call your pharmacy.

## 2015-06-18 ENCOUNTER — Ambulatory Visit (HOSPITAL_COMMUNITY): Payer: Medicare Other | Attending: Internal Medicine

## 2015-06-18 ENCOUNTER — Other Ambulatory Visit (HOSPITAL_COMMUNITY): Payer: Medicare Other | Admitting: Cardiology

## 2015-06-18 DIAGNOSIS — I255 Ischemic cardiomyopathy: Secondary | ICD-10-CM

## 2015-06-18 DIAGNOSIS — E785 Hyperlipidemia, unspecified: Secondary | ICD-10-CM | POA: Diagnosis not present

## 2015-06-18 DIAGNOSIS — F17200 Nicotine dependence, unspecified, uncomplicated: Secondary | ICD-10-CM | POA: Diagnosis not present

## 2015-06-18 MED ORDER — PERFLUTREN LIPID MICROSPHERE
1.0000 mL | INTRAVENOUS | Status: DC | PRN
  Administered 2015-06-18: 2 mL via INTRAVENOUS

## 2015-06-21 ENCOUNTER — Telehealth: Payer: Self-pay | Admitting: Cardiology

## 2015-06-21 NOTE — Telephone Encounter (Signed)
New Message ° °Pt called for ECHO results °

## 2015-06-21 NOTE — Telephone Encounter (Signed)
Advised wife  

## 2015-06-21 NOTE — Telephone Encounter (Signed)
-----   Message from Cassell Clement, MD sent at 06/18/2015  7:11 PM EST ----- Please report.  EF is slightly better at 35-40%. CSD.

## 2015-07-12 ENCOUNTER — Telehealth: Payer: Self-pay | Admitting: Cardiology

## 2015-07-12 NOTE — Telephone Encounter (Signed)
Spoke w/ pt and requested that pt send a manual transmission w/ home monitor b/c his monitor has not updated in at least 8 days. Pt verbalized understanding.

## 2015-07-20 ENCOUNTER — Ambulatory Visit (INDEPENDENT_AMBULATORY_CARE_PROVIDER_SITE_OTHER): Payer: Medicare Other | Admitting: *Deleted

## 2015-07-20 DIAGNOSIS — I255 Ischemic cardiomyopathy: Secondary | ICD-10-CM | POA: Diagnosis not present

## 2015-07-20 NOTE — Progress Notes (Signed)
Remote ICD transmission.   

## 2015-08-20 ENCOUNTER — Encounter: Payer: Self-pay | Admitting: Cardiology

## 2015-08-20 LAB — CUP PACEART REMOTE DEVICE CHECK
Battery Remaining Longevity: 66 mo
Battery Remaining Percentage: 58 %
Battery Voltage: 2.95 V
Date Time Interrogation Session: 20170228080246
HIGH POWER IMPEDANCE MEASURED VALUE: 90 Ohm
HIGH POWER IMPEDANCE MEASURED VALUE: 90 Ohm
Implantable Lead Implant Date: 20120621
Lead Channel Impedance Value: 400 Ohm
Lead Channel Pacing Threshold Amplitude: 1 V
Lead Channel Pacing Threshold Pulse Width: 0.5 ms
Lead Channel Sensing Intrinsic Amplitude: 12 mV
MDC IDC LEAD LOCATION: 753860
MDC IDC PG SERIAL: 819170
MDC IDC SET LEADCHNL RV PACING AMPLITUDE: 2.5 V
MDC IDC SET LEADCHNL RV PACING PULSEWIDTH: 0.5 ms
MDC IDC SET LEADCHNL RV SENSING SENSITIVITY: 0.5 mV
MDC IDC STAT BRADY RV PERCENT PACED: 1 %

## 2015-09-28 ENCOUNTER — Other Ambulatory Visit: Payer: Self-pay | Admitting: *Deleted

## 2015-09-28 MED ORDER — PRASUGREL HCL 10 MG PO TABS: 10.0000 mg | ORAL_TABLET | Freq: Every day | ORAL | Status: DC

## 2015-10-01 ENCOUNTER — Encounter: Payer: Self-pay | Admitting: Geriatric Medicine

## 2015-10-08 NOTE — Progress Notes (Signed)
Cardiology Office Note   Date:  10/11/2015   ID:  Spencer Horn, DOB 03-12-1951, MRN [id number]  PCP:  Spencer Broker, MD  Cardiologist:   Spencer Si, MD   Chief Complaint  Patient presents with  . reestablish care     History of Present Illness: Spencer Horn is a 65 y.o. male with CAD s/p MI, hypertension, and hyperlipidemia who presents for follow up.  Spencer Horn was previously a patient of Spencer Horn.  He had an MI in 2004 and 06/2010.  The second MI was treated with DES to the LAD.  He then had LVEF 35% and subsequently underwent implantation of an ICD.  Spencer Horn has been doing pretty well.  He denies chest pain but he does note shortness of breath with minimal activity.  Even walking in the home make him short of breath.  He is able to do yard work but has to stop often.  Things that previously would have taken an hour now take him all day to complete.  He has not noted any lower extremity edema, orthopnea or PND.  His weight increased to 210 lb over the winter, which he attributed to inactivity.  Over the last several weeks he started walking for 15-20 minutes and his weight is now down to 204lb.  He and his wife have been trying to limit their salt intake and eating small meals.  He thinks his diet is fairly healthy.  Spencer Horn likes to get his labs drawn before his appointment.    Past Medical History  Diagnosis Date  . History of acute anterior wall MI   . Dyslipidemia   . Ventricular dysfunction 07/07/2010    Severe left ventricular dysfunction with ejection fraction of 30-35%  . Personal history of MRSA (methicillin resistant Staphylococcus aureus)   . Hyperlipidemia   . Coronary artery disease   . Ischemic cardiomyopathy   . Tobacco abuse   . ICD (implantable cardiac defibrillator) in place June 2012    Past Surgical History  Procedure Laterality Date  . Cardiac catheterization  07/07/2010    DES to proximal LAD  . Cardiac catheterization  2004  . Ep  implantable device  June 2012     Current Outpatient Prescriptions  Medication Sig Dispense Refill  . ALPRAZolam (XANAX) 0.25 MG tablet Take 1 tablet (0.25 mg total) by mouth at bedtime as needed for anxiety. 30 tablet 5  . aspirin 81 MG tablet Take 81 mg by mouth daily.      [name] atorvastatin (LIPITOR) 40 MG tablet Take 1 tablet (40 mg total) by mouth daily. 30 tablet 11  . carvedilol (COREG) 6.25 MG tablet Take 0.5 tablets (3.125 mg total) by mouth 2 (two) times daily. 30 tablet 11  . losartan (COZAAR) 25 MG tablet Take 1 tablet (25 mg total) by mouth daily. 30 tablet 11  . Multiple Vitamin (MULTIVITAMIN) tablet Take 1 tablet by mouth daily.    . nitroGLYCERIN (NITROSTAT) 0.4 MG SL tablet Place 0.4 mg under the tongue every 5 (five) minutes as needed for chest pain (for chest pain).    . prasugrel (EFFIENT) 10 MG TABS tablet Take 1 tablet (10 mg total) by mouth daily. 30 tablet 8  . spironolactone (ALDACTONE) 25 MG tablet Take 0.5 tablets (12.5 mg total) by mouth daily. 15 tablet 11   No current facility-administered medications for this visit.    Allergies:   Review of patient's allergies indicates no known allergies.  Social History:  The patient  reports that he has quit smoking. His smoking use included Cigarettes. He does not have any smokeless tobacco history on file. He reports that he does not drink alcohol or use illicit drugs.   Family History:  The patient's family history includes Bladder Cancer in his father; Bone cancer in his mother; Diabetes in his father; Hyperlipidemia in his father; Hypertension in his mother.    ROS:  Please see the history of present illness.   Otherwise, review of systems are positive for none.   All other systems are reviewed and negative.    PHYSICAL EXAM: VS:  BP 120/88 mmHg  Pulse 68  Ht  (1.676 m)  Wt 92.806 kg (204 lb 9.6 oz)  BMI 33.04 kg/m2 , BMI Body mass index is 33.04 kg/(m^2). GENERAL:  Well appearing HEENT:  Pupils equal  round and reactive, fundi not visualized, oral mucosa unremarkable NECK:  No jugular venous distention, waveform within normal limits, carotid upstroke brisk and symmetric, no bruits, no thyromegaly LYMPHATICS:  No cervical adenopathy LUNGS:  Clear to auscultation bilaterally HEART:  RRR.  PMI not displaced or sustained,S1 and S2 within normal limits, no S3, no S4, no clicks, no rubs, no  murmurs ABD:  Flat, positive bowel sounds normal in frequency in pitch, no bruits, no rebound, no guarding, no midline pulsatile mass, no hepatomegaly, no splenomegaly EXT:  2 plus pulses throughout, no edema, no cyanosis no clubbing SKIN:  No rashes no nodules NEURO:  Cranial nerves II through XII grossly intact, motor grossly intact throughout PSYCH:  Cognitively intact, oriented to person place and time   EKG:  EKG is ordered today. The ekg ordered today demonstrates sinus rhythm rate 68 bpm.  Low voltage limb leads and precordial leads.  Prior anteroseptal infarct.    Echo 06/18/15: Study Conclusions  - Left ventricle: LVEF is severely depressed at approximately 35 to  40% with severe hypokinesis of the mid/distal lateral, mid/distal  septal, distal inferior, apical wals. The cavity size was normal. - Ventricular septum: The contour showed systolic flattening.   Recent Labs: 06/07/2015: ALT 13; BUN 14; Creat 0.99; Potassium 4.0; Sodium 139    Lipid Panel    Component Value Date/Time   CHOL 84* 06/07/2015 0914   TRIG 100 06/07/2015 0914   HDL 30* 06/07/2015 0914   CHOLHDL 2.8 06/07/2015 0914   VLDL 20 06/07/2015 0914   LDLCALC 34 06/07/2015 0914      Wt Readings from Last 3 Encounters:  10/11/15 92.806 kg (204 lb 9.6 oz)  06/09/15 92.715 kg (204 lb 6.4 oz)  02/01/15 91.627 kg (202 lb)      ASSESSMENT AND PLAN:  # CAD s/p MI: Spencer Horn is doing well.  He denies angina.  Continue aspirin, prasugrel, carvedilol and atorvastatin.  # Chronic systolic and diastolic heart failure:   #  Shortness of breath: Spencer Horn does not have heart failure on exam.  However, he has exertional dyspnea.  I suspect that this is more related to deconditioning than heart failure.  I have asked him to increase his physical activity for 1-2 months.  If the dyspnea does not improve we will start Entresto.  For now, continue carvedilol, losartan and spironolactone.    # Hypertension: BP well-controlled. Continue carvedilol and losartan.  Check BMP for drug monitoring.   # Hyperlipidemia: Continue atorvastatin.  LDL 34.  Check LFTs for drug monitoring.   Current medicines are reviewed at length with the patient  today.  The patient does not have concerns regarding medicines.  The following changes have been made:  no change  Labs/ tests ordered today include:   Orders Placed This Encounter  Procedures  . Basic metabolic panel  . Hepatic function panel  . EKG 12-Lead     Disposition:   FU with Kristyne Woodring C. Duke Salviaandolph, MD, [facility] in 2 months   This note was written with the assistance of speech recognition software.  Please excuse any transcriptional errors.  Signed, Krystle Oberman C. Duke Salviaandolph, MD, [facility]  10/11/2015 2:04 PM    [facility]

## 2015-10-11 ENCOUNTER — Encounter: Payer: Self-pay | Admitting: Cardiovascular Disease

## 2015-10-11 ENCOUNTER — Encounter: Payer: Self-pay | Admitting: *Deleted

## 2015-10-11 ENCOUNTER — Ambulatory Visit (INDEPENDENT_AMBULATORY_CARE_PROVIDER_SITE_OTHER): Payer: Medicare Other | Admitting: Cardiovascular Disease

## 2015-10-11 VITALS — BP 120/88 | HR 68 | Ht 66.0 in | Wt 204.6 lb

## 2015-10-11 DIAGNOSIS — I2510 Atherosclerotic heart disease of native coronary artery without angina pectoris: Secondary | ICD-10-CM

## 2015-10-11 DIAGNOSIS — I5042 Chronic combined systolic (congestive) and diastolic (congestive) heart failure: Secondary | ICD-10-CM

## 2015-10-11 DIAGNOSIS — I10 Essential (primary) hypertension: Secondary | ICD-10-CM | POA: Diagnosis not present

## 2015-10-11 DIAGNOSIS — Z79899 Other long term (current) drug therapy: Secondary | ICD-10-CM

## 2015-10-11 DIAGNOSIS — E785 Hyperlipidemia, unspecified: Secondary | ICD-10-CM

## 2015-10-11 DIAGNOSIS — I2583 Coronary atherosclerosis due to lipid rich plaque: Secondary | ICD-10-CM

## 2015-10-11 NOTE — Patient Instructions (Signed)
Medication Instructions:  Your physician recommends that you continue on your current medications as directed. Please refer to the Current Medication list given to you today.  Labwork: [facility] at [facility] lab on the first floor  Testing/Procedures: none  Follow-Up: Your physician recommends that you schedule a follow-up appointment in: 2 month ov  If you need a refill on your cardiac medications before your next appointment, please call your pharmacy.

## 2015-10-12 ENCOUNTER — Encounter: Payer: Self-pay | Admitting: Cardiovascular Disease

## 2015-10-12 LAB — BASIC METABOLIC PANEL
BUN: 18 mg/dL (ref 7–25)
CHLORIDE: 105 mmol/L (ref 98–110)
CO2: 24 mmol/L (ref 20–31)
Calcium: 8.7 mg/dL (ref 8.6–10.3)
Creat: 0.93 mg/dL (ref 0.70–1.25)
Glucose, Bld: 127 mg/dL — ABNORMAL HIGH (ref 65–99)
POTASSIUM: 4.3 mmol/L (ref 3.5–5.3)
Sodium: 138 mmol/L (ref 135–146)

## 2015-10-19 ENCOUNTER — Ambulatory Visit (INDEPENDENT_AMBULATORY_CARE_PROVIDER_SITE_OTHER): Payer: Medicare Other | Admitting: *Deleted

## 2015-10-19 DIAGNOSIS — I255 Ischemic cardiomyopathy: Secondary | ICD-10-CM

## 2015-10-20 NOTE — Progress Notes (Signed)
Remote ICD transmission.   

## 2015-11-04 LAB — CUP PACEART REMOTE DEVICE CHECK
Battery Voltage: 2.95 V
HighPow Impedance: 95 Ohm
HighPow Impedance: 95 Ohm
Implantable Lead Implant Date: 20120621
Implantable Lead Location: 753860
Lead Channel Pacing Threshold Pulse Width: 0.5 ms
Lead Channel Sensing Intrinsic Amplitude: 12 mV
Lead Channel Setting Pacing Amplitude: 2.5 V
Lead Channel Setting Pacing Pulse Width: 0.5 ms
Lead Channel Setting Sensing Sensitivity: 0.5 mV
MDC IDC MSMT BATTERY REMAINING LONGEVITY: 64 mo
MDC IDC MSMT BATTERY REMAINING PERCENTAGE: 56 %
MDC IDC MSMT LEADCHNL RV IMPEDANCE VALUE: 430 Ohm
MDC IDC MSMT LEADCHNL RV PACING THRESHOLD AMPLITUDE: 1 V
MDC IDC SESS DTM: 20170530063139
MDC IDC STAT BRADY RV PERCENT PACED: 1 %
Pulse Gen Serial Number: 819170

## 2015-11-05 ENCOUNTER — Encounter: Payer: Self-pay | Admitting: Internal Medicine

## 2015-11-09 ENCOUNTER — Encounter: Payer: Self-pay | Admitting: Cardiology

## 2015-12-09 ENCOUNTER — Encounter: Payer: Self-pay | Admitting: Cardiovascular Disease

## 2015-12-09 ENCOUNTER — Ambulatory Visit (INDEPENDENT_AMBULATORY_CARE_PROVIDER_SITE_OTHER): Payer: Medicare Other | Admitting: Cardiovascular Disease

## 2015-12-09 VITALS — BP 110/80 | HR 74 | Ht 66.0 in | Wt 202.6 lb

## 2015-12-09 DIAGNOSIS — R0602 Shortness of breath: Secondary | ICD-10-CM | POA: Diagnosis not present

## 2015-12-09 DIAGNOSIS — I110 Hypertensive heart disease with heart failure: Secondary | ICD-10-CM | POA: Diagnosis not present

## 2015-12-09 DIAGNOSIS — E785 Hyperlipidemia, unspecified: Secondary | ICD-10-CM

## 2015-12-09 DIAGNOSIS — Z79899 Other long term (current) drug therapy: Secondary | ICD-10-CM | POA: Diagnosis not present

## 2015-12-09 DIAGNOSIS — Z955 Presence of coronary angioplasty implant and graft: Secondary | ICD-10-CM

## 2015-12-09 NOTE — Progress Notes (Signed)
Cardiology Office Note   Date:  12/09/2015   ID:  Spencer Horn, DOB 16-Apr-1951, MRN [id number]  PCP:  Spencer BrokerElizabeth A Crawford, Spencer Horn  Cardiologist:   Spencer Siiffany [place], Spencer Horn   Chief Complaint  Patient presents with  . Follow-up     History of Present Illness: Spencer Horn is a 65 y.o. male with CAD s/p MI, hypertension, chronic systolic and diastolic heart failure, and hyperlipidemia who presents for follow up.  Spencer Horn was previously a patient of Spencer Horn.  He had an MI in 2004 and 06/2010.  The second MI was treated with DES to the LAD.  He then had LVEF 35% and subsequently underwent implantation of an ICD.  Spencer Horn has been doing well.  At the last appointment he reported shortness of breath but this has improved.  He has been doing yard work without limitation.  He also denies chest pain.  He hasn't been formally exercising but has lost 5 lb by improving his diet.  He has limited his snacking and is eating smaller portions.  He denies lower extremity edema, orthopnea or PND.  His wife notes that he snores but does not have apneic episodes.  He feels well rested when awakening.  Overall he has no complaints at this time.  Spencer Horn likes to get his labs drawn before his appointment.    Past Medical History  Diagnosis Date  . History of acute anterior wall MI   . Dyslipidemia   . Ventricular dysfunction 07/07/2010    Severe left ventricular dysfunction with ejection fraction of 30-35%  . Personal history of MRSA (methicillin resistant Staphylococcus aureus)   . Hyperlipidemia   . Coronary artery disease   . Ischemic cardiomyopathy   . Tobacco abuse   . ICD (implantable cardiac defibrillator) in place June 2012    Past Surgical History  Procedure Laterality Date  . Cardiac catheterization  07/07/2010    DES to proximal LAD  . Cardiac catheterization  2004  . Ep implantable device  June 2012     Current Outpatient Prescriptions  Medication Sig Dispense Refill  . ALPRAZolam  (XANAX) 0.25 MG tablet Take 1 tablet (0.25 mg total) by mouth at bedtime as needed for anxiety. 30 tablet 5  . aspirin 81 MG tablet Take 81 mg by mouth daily.      [name]. atorvastatin (LIPITOR) 40 MG tablet Take 1 tablet (40 mg total) by mouth daily. 30 tablet 11  . carvedilol (COREG) 6.25 MG tablet Take 0.5 tablets (3.125 mg total) by mouth 2 (two) times daily. 30 tablet 11  . losartan (COZAAR) 25 MG tablet Take 1 tablet (25 mg total) by mouth daily. 30 tablet 11  . Multiple Vitamin (MULTIVITAMIN) tablet Take 1 tablet by mouth daily.    . nitroGLYCERIN (NITROSTAT) 0.4 MG SL tablet Place 0.4 mg under the tongue every 5 (five) minutes as needed for chest pain (for chest pain).    . prasugrel (EFFIENT) 10 MG TABS tablet Take 1 tablet (10 mg total) by mouth daily. 30 tablet 8  . spironolactone (ALDACTONE) 25 MG tablet Take 0.5 tablets (12.5 mg total) by mouth daily. 15 tablet 11   No current facility-administered medications for this visit.    Allergies:   Review of patient's allergies indicates no known allergies.    Social History:  The patient  reports that he has quit smoking. His smoking use included Cigarettes. He does not have any smokeless tobacco history on file. He  reports that he does not drink alcohol or use illicit drugs.   Family History:  The patient's family history includes Bladder Cancer in his father; Bone cancer in his mother; Diabetes in his father; Hyperlipidemia in his father; Hypertension in his mother.    ROS:  Please see the history of present illness.   Otherwise, review of systems are positive for none.   All other systems are reviewed and negative.    PHYSICAL EXAM: VS:  BP 110/80 mmHg  Pulse 74  Ht 5\' 6"  (1.676 m)  Wt 202 lb 9.6 oz (91.899 kg)  BMI 32.72 kg/m2  SpO2 98% , BMI Body mass index is 32.72 kg/(m^2). GENERAL:  Well appearing HEENT:  Pupils equal round and reactive, fundi not visualized, oral mucosa unremarkable NECK:  No jugular venous distention,  waveform within normal limits, carotid upstroke brisk and symmetric, no bruits, no thyromegaly LYMPHATICS:  No cervical adenopathy LUNGS:  Clear to auscultation bilaterally HEART:  RRR.  PMI not displaced or sustained,S1 and S2 within normal limits, no S3, no S4, no clicks, no rubs, no  murmurs ABD:  Flat, positive bowel sounds normal in frequency in pitch, no bruits, no rebound, no guarding, no midline pulsatile mass, no hepatomegaly, no splenomegaly EXT:  2 plus pulses throughout, no edema, no cyanosis no clubbing SKIN:  No rashes no nodules NEURO:  Cranial nerves II through XII grossly intact, motor grossly intact throughout PSYCH:  Cognitively intact, oriented to person place and time   EKG:  EKG is ordered today. The ekg ordered today demonstrates sinus rhythm rate 68 bpm.  Low voltage limb leads and precordial leads.  Prior anteroseptal infarct.    Echo 06/18/15: Study Conclusions  - Left ventricle: LVEF is severely depressed at approximately 35 to  40% with severe hypokinesis of the mid/distal lateral, mid/distal  septal, distal inferior, apical wals. The cavity size was normal. - Ventricular septum: The contour showed systolic flattening.   Recent Labs: 06/07/2015: ALT 13 10/11/2015: BUN 18; Creat 0.93; Potassium 4.3; Sodium 138    Lipid Panel    Component Value Date/Time   CHOL 84* 06/07/2015 0914   TRIG 100 06/07/2015 0914   HDL 30* 06/07/2015 0914   CHOLHDL 2.8 06/07/2015 0914   VLDL 20 06/07/2015 0914   LDLCALC 34 06/07/2015 0914      Wt Readings from Last 3 Encounters:  12/09/15 202 lb 9.6 oz (91.899 kg)  10/11/15 204 lb 9.6 oz (92.806 kg)  06/09/15 204 lb 6.4 oz (92.715 kg)      ASSESSMENT AND PLAN:  # CAD s/p MI: Spencer Horn is doing well.  He denies angina.  Continue aspirin, prasugrel, carvedilol and atorvastatin.  He was encouraged to increase his physical activity to at least 30 minutes most days of the week.  # Chronic systolic and diastolic heart  failure:   # Shortness of breath: Shortness of breath has improved and Spencer Horn does not have heart failure on exam.  Consider Entresto if he develops symptomatic heart failure in the future.  For now, continue carvedilol, losartan and spironolactone.    # Hypertension: BP well-controlled. Continue carvedilol and losartan.  Check BMP prior to his next appointment.  # Hyperlipidemia: Continue atorvastatin.  LDL 34.  Check LFTs prior to his next appointment   Current medicines are reviewed at length with the patient today.  The patient does not have concerns regarding medicines.  The following changes have been made:  no change  Labs/ tests ordered today  include:   Orders Placed This Encounter  Procedures  . Lipid Profile  . COMPLETE METABOLIC PANEL WITH GFR     Disposition:   FU with Spencer Jurich C. Duke Salvia, Spencer Horn, [facility] in 4 months  Time spent: 20 minutes-Greater than 50% of this time was spent in counseling, explanation of diagnosis, planning of further management, and coordination of care.  This note was written with the assistance of speech recognition software.  Please excuse any transcriptional errors.  Signed, Kerline Trahan C. Duke Salvia, Spencer Horn, [facility]  12/09/2015 4:03 PM    [facility]

## 2015-12-09 NOTE — Patient Instructions (Signed)
Medication Instructions:  Continue current medication  Labwork: CMP and Lipids  Testing/Procedures: NONE  Follow-Up: Your physician recommends that you schedule a follow-up appointment in: 4 Months   Any Other Special Instructions Will Be Listed Below (If Applicable).   If you need a refill on your cardiac medications before your next appointment, please call your pharmacy.

## 2016-01-20 ENCOUNTER — Ambulatory Visit (INDEPENDENT_AMBULATORY_CARE_PROVIDER_SITE_OTHER): Payer: Medicare Other | Admitting: Internal Medicine

## 2016-01-20 ENCOUNTER — Encounter: Payer: Self-pay | Admitting: Internal Medicine

## 2016-01-20 VITALS — BP 110/84 | HR 74 | Ht 66.0 in | Wt 203.8 lb

## 2016-01-20 DIAGNOSIS — I255 Ischemic cardiomyopathy: Secondary | ICD-10-CM | POA: Diagnosis not present

## 2016-01-20 DIAGNOSIS — Z4502 Encounter for adjustment and management of automatic implantable cardiac defibrillator: Secondary | ICD-10-CM | POA: Diagnosis not present

## 2016-01-20 NOTE — Progress Notes (Signed)
HPI [name] returns today for followup. He is a very pleasant 65 year old man with an ischemic cardiomyopathy, status post anterior myocardial infarction, with chronic systolic heart failure, class 2, ejection fraction 30%. He is status post ICD implantation. In the interim, he has done well. He works out in his yard regularly - he notes he keeps up 2 yards. He denies chest pain, syncope, or peripheral edema. He has had a problem with obesity and despite dieting has not lost weight. No Known Allergies   Current Outpatient Prescriptions  Medication Sig Dispense Refill  . ALPRAZolam (XANAX) 0.25 MG tablet Take 1 tablet (0.25 mg total) by mouth at bedtime as needed for anxiety. 30 tablet 5  . aspirin 81 MG tablet Take 81 mg by mouth daily.      [name]. atorvastatin (LIPITOR) 40 MG tablet Take 1 tablet (40 mg total) by mouth daily. 30 tablet 11  . carvedilol (COREG) 6.25 MG tablet Take 0.5 tablets (3.125 mg total) by mouth 2 (two) times daily. 30 tablet 11  . losartan (COZAAR) 25 MG tablet Take 1 tablet (25 mg total) by mouth daily. 30 tablet 11  . Multiple Vitamin (MULTIVITAMIN) tablet Take 1 tablet by mouth daily.    . nitroGLYCERIN (NITROSTAT) 0.4 MG SL tablet Place 0.4 mg under the tongue every 5 (five) minutes as needed for chest pain (for chest pain).    . prasugrel (EFFIENT) 10 MG TABS tablet Take 1 tablet (10 mg total) by mouth daily. 30 tablet 8  . spironolactone (ALDACTONE) 25 MG tablet Take 0.5 tablets (12.5 mg total) by mouth daily. 15 tablet 11   No current facility-administered medications for this visit.      Past Medical History:  Diagnosis Date  . Coronary artery disease   . Dyslipidemia   . History of acute anterior wall MI   . Hyperlipidemia   . ICD (implantable cardiac defibrillator) in place June 2012  . Ischemic cardiomyopathy   . Personal history of MRSA (methicillin resistant Staphylococcus aureus)   . Tobacco abuse   . Ventricular dysfunction 07/07/2010   Severe left  ventricular dysfunction with ejection fraction of 30-35%    ROS:   All systems reviewed and negative except as noted in the HPI.   Past Surgical History:  Procedure Laterality Date  . CARDIAC CATHETERIZATION  07/07/2010   DES to proximal LAD  . CARDIAC CATHETERIZATION  2004  . EP IMPLANTABLE DEVICE  June 2012     Family History  Problem Relation Age of Onset  . Hypertension Mother   . Bone cancer Mother   . Hyperlipidemia Father   . Bladder Cancer Father   . Diabetes Father      Social History   Social History  . Marital status: Married    Spouse name: N/A  . Number of children: N/A  . Years of education: N/A   Occupational History  . Not on file.   Social History Main Topics  . Smoking status: Former Smoker    Types: Cigarettes  . Smokeless tobacco: Not on file  . Alcohol use No  . Drug use: No  . Sexual activity: Yes   Other Topics Concern  . Not on file   Social History Narrative  . No narrative on file     BP 110/84   Pulse 74   Ht 5\' 6"  (1.676 m)   Wt 203 lb 12.8 oz (92.4 kg)   BMI 32.89 kg/m   Physical Exam:  Well appearing obese,  middle-aged man,NAD HEENT: Unremarkable Neck:  6 cm JVD, no thyromegally Back:  No CVA tenderness Lungs:  Clear with no wheezes, rales, or rhonchi. HEART:  Regular rate rhythm, no murmurs, no rubs, no clicks Abd:  soft, positive bowel sounds, no organomegally, no rebound, no guarding Ext:  2 plus pulses, no edema, no cyanosis, no clubbing Skin:  No rashes no nodules Neuro:  CN II through XII intact, motor grossly intact  DEVICE  Normal device function.  See PaceArt for details.   Assess/Plan: 1. CAD - he remains active and denies anginal symptoms. He will continue his current meds. 2. ICD - his [employer] Jude device is working normally. Will recheck in several months. 3. HTN - His blood pressure is controlled. No change in meds.  Leonia Reeves.D.

## 2016-01-20 NOTE — Patient Instructions (Signed)
Medication Instructions:  Your physician recommends that you continue on your current medications as directed. Please refer to the Current Medication list given to you today.   Labwork: None ordered   Testing/Procedures: None ordered   Follow-Up: Your physician wants you to follow-up in: 12 months with Dr Taylor You will receive a reminder letter in the mail two months in advance. If you don't receive a letter, please call our office to schedule the follow-up appointment.   Remote monitoring is used to monitor your ICD from home. This monitoring reduces the number of office visits required to check your device to one time per year. It allows us to keep an eye on the functioning of your device to ensure it is working properly. You are scheduled for a device check from home on 04/20/16. You may send your transmission at any time that day. If you have a wireless device, the transmission will be sent automatically. After your physician reviews your transmission, you will receive a postcard with your next transmission date.    Any Other Special Instructions Will Be Listed Below (If Applicable).     If you need a refill on your cardiac medications before your next appointment, please call your pharmacy.   

## 2016-01-26 ENCOUNTER — Emergency Department (HOSPITAL_COMMUNITY)
Admission: EM | Admit: 2016-01-26 | Discharge: 2016-01-27 | Disposition: A | Discharge status: Home / Self Care | Payer: Medicare Other | Attending: Emergency Medicine | Admitting: Emergency Medicine

## 2016-01-26 ENCOUNTER — Emergency Department (HOSPITAL_COMMUNITY): Payer: Medicare Other

## 2016-01-26 ENCOUNTER — Encounter (HOSPITAL_COMMUNITY): Payer: Self-pay | Admitting: Emergency Medicine

## 2016-01-26 DIAGNOSIS — I2510 Atherosclerotic heart disease of native coronary artery without angina pectoris: Secondary | ICD-10-CM | POA: Diagnosis not present

## 2016-01-26 DIAGNOSIS — Z79899 Other long term (current) drug therapy: Secondary | ICD-10-CM | POA: Insufficient documentation

## 2016-01-26 DIAGNOSIS — R0789 Other chest pain: Secondary | ICD-10-CM | POA: Insufficient documentation

## 2016-01-26 DIAGNOSIS — I5022 Chronic systolic (congestive) heart failure: Secondary | ICD-10-CM | POA: Diagnosis not present

## 2016-01-26 DIAGNOSIS — Z87891 Personal history of nicotine dependence: Secondary | ICD-10-CM | POA: Diagnosis not present

## 2016-01-26 DIAGNOSIS — R079 Chest pain, unspecified: Secondary | ICD-10-CM

## 2016-01-26 DIAGNOSIS — Z7982 Long term (current) use of aspirin: Secondary | ICD-10-CM | POA: Diagnosis not present

## 2016-01-26 DIAGNOSIS — I110 Hypertensive heart disease with heart failure: Secondary | ICD-10-CM | POA: Diagnosis not present

## 2016-01-26 LAB — CBC
HEMATOCRIT: 44.1 % (ref 39.0–52.0)
HEMOGLOBIN: 14.8 g/dL (ref 13.0–17.0)
MCH: 29.2 pg (ref 26.0–34.0)
MCHC: 33.6 g/dL (ref 30.0–36.0)
MCV: 87.2 fL (ref 78.0–100.0)
Platelets: 176 10*3/uL (ref 150–400)
RBC: 5.06 MIL/uL (ref 4.22–5.81)
RDW: 13.1 % (ref 11.5–15.5)
WBC: 7.3 10*3/uL (ref 4.0–10.5)

## 2016-01-26 LAB — BASIC METABOLIC PANEL
Anion gap: 9 (ref 5–15)
BUN: 20 mg/dL (ref 6–20)
CHLORIDE: 105 mmol/L (ref 101–111)
CO2: 25 mmol/L (ref 22–32)
CREATININE: 1.33 mg/dL — AB (ref 0.61–1.24)
Calcium: 9.2 mg/dL (ref 8.9–10.3)
GFR calc non Af Amer: 55 mL/min — ABNORMAL LOW (ref >60)
Glucose, Bld: 127 mg/dL — ABNORMAL HIGH (ref 65–99)
POTASSIUM: 3.8 mmol/L (ref 3.5–5.1)
Sodium: 139 mmol/L (ref 135–145)

## 2016-01-26 LAB — I-STAT TROPONIN, ED
Troponin i, poc: 0 ng/mL (ref 0.00–0.08)
Troponin i, poc: 0 ng/mL (ref 0.00–0.08)

## 2016-01-26 NOTE — ED Provider Notes (Signed)
[facility]-EMERGENCY DEPT Provider Note   CSN: [id number] Arrival date & time: 01/26/16  1957     History   Chief Complaint Chief Complaint  Patient presents with  . Chest Pain    HPI Spencer Horn is a 65 y.o. male.  HPI Patient presents to the emergency department with a sensation of chest burning and discomfort with some tightness.  He does have a known history of coronary artery disease including repeat heart catheterization 2012.  Is compliant with his medications.  This occurred while at rest today.  He reports he took 2 nitroglycerin at home and feels much better at this time.  His family convinced him to come to the emergency department for evaluation.  At this time he feels fine and would like to go home.  He has no fevers or chills.  No recent cough or congestion.  He reports walking some around his food 2 days ago but he had no chest discomfort or pain at that time.  He does not believe this feels similar to his prior MI that required stents    Past Medical History:  Diagnosis Date  . Coronary artery disease   . Dyslipidemia   . History of acute anterior wall MI   . Hyperlipidemia   . ICD (implantable cardiac defibrillator) in place June 2012  . Ischemic cardiomyopathy   . Personal history of MRSA (methicillin resistant Staphylococcus aureus)   . Tobacco abuse   . Ventricular dysfunction 07/07/2010   Severe left ventricular dysfunction with ejection fraction of 30-35%    Patient Active Problem List   Diagnosis Date Noted  . Anxiety 12/30/2013  . Malaise and fatigue 12/29/2011  . Dyspepsia 08/14/2011  . Dual implantable cardioverter-defibrillator in situ 02/21/2011  . Chronic systolic heart failure (HCC) 10/30/2010  . Coronary artery disease   . Hyperlipidemia   . Dyslipidemia   . Hypertension   . Ischemic cardiomyopathy   . Ventricular dysfunction 07/07/2010    Past Surgical History:  Procedure Laterality Date  . CARDIAC CATHETERIZATION  07/07/2010   DES to  proximal LAD  . CARDIAC CATHETERIZATION  2004  . EP IMPLANTABLE DEVICE  June 2012       Home Medications    Prior to Admission medications   Medication Sig Start Date End Date Taking? Authorizing Provider  aspirin 81 MG tablet Take 81 mg by mouth daily.     Yes Historical Provider, MD  atorvastatin (LIPITOR) 40 MG tablet Take 1 tablet (40 mg total) by mouth daily. 06/09/15  Yes Cassell Clementhomas Brackbill, MD  carvedilol (COREG) 6.25 MG tablet Take 0.5 tablets (3.125 mg total) by mouth 2 (two) times daily. 06/09/15  Yes Cassell Clementhomas Brackbill, MD  losartan (COZAAR) 25 MG tablet Take 1 tablet (25 mg total) by mouth daily. 06/09/15  Yes Cassell Clementhomas Brackbill, MD  Multiple Vitamin (MULTIVITAMIN) tablet Take 1 tablet by mouth daily.   Yes Historical Provider, MD  nitroGLYCERIN (NITROSTAT) 0.4 MG SL tablet Place 0.4 mg under the tongue every 5 (five) minutes as needed for chest pain (for chest pain).   Yes Historical Provider, MD  prasugrel (EFFIENT) 10 MG TABS tablet Take 1 tablet (10 mg total) by mouth daily. 09/28/15  Yes Chilton Siiffany Linden, MD  spironolactone (ALDACTONE) 25 MG tablet Take 0.5 tablets (12.5 mg total) by mouth daily. 06/09/15  Yes Cassell Clementhomas Brackbill, MD  ALPRAZolam Prudy Feeler(XANAX) 0.25 MG tablet Take 1 tablet (0.25 mg total) by mouth at bedtime as needed for anxiety. Patient not taking: Reported on 01/26/2016  12/30/13   Cassell Clement, MD    Family History Family History  Problem Relation Age of Onset  . Hypertension Mother   . Bone cancer Mother   . Hyperlipidemia Father   . Bladder Cancer Father   . Diabetes Father     Social History Social History  Substance Use Topics  . Smoking status: Former Smoker    Types: Cigarettes  . Smokeless tobacco: Not on file  . Alcohol use No     Allergies   Review of patient's allergies indicates no known allergies.   Review of Systems Review of Systems  All other systems reviewed and are negative.    Physical Exam Updated Vital Signs BP 116/84   Pulse  70   Temp 98.1 F (36.7 C) (Oral)   Resp 16   Ht 5\' 6"  (1.676 m)   Wt 204 lb (92.5 kg)   SpO2 100%   BMI 32.93 kg/m   Physical Exam  Constitutional: He is oriented to person, place, and time. He appears well-developed and well-nourished.  HENT:  Head: Normocephalic and atraumatic.  Eyes: EOM are normal.  Neck: Normal range of motion.  Cardiovascular: Normal rate, regular rhythm, normal heart sounds and intact distal pulses.   Pulmonary/Chest: Effort normal and breath sounds normal. No respiratory distress.  Abdominal: Soft. He exhibits no distension. There is no tenderness.  Musculoskeletal: Normal range of motion. He exhibits no edema or tenderness.  Neurological: He is alert and oriented to person, place, and time.  Skin: Skin is warm and dry.  Psychiatric: He has a normal mood and affect. Judgment normal.  Nursing note and vitals reviewed.    ED Treatments / Results  Labs (all labs ordered are listed, but only abnormal results are displayed) Labs Reviewed  BASIC METABOLIC PANEL - Abnormal; Notable for the following:       Result Value   Glucose, Bld 127 (*)    Creatinine, Ser 1.33 (*)    GFR calc non Af Amer 55 (*)    All other components within normal limits  CBC  I-STAT TROPOININ, ED  I-STAT TROPOININ, ED    EKG  EKG Interpretation #1  Date/Time:  Wednesday January 26 2016 20:02:27 EDT Ventricular Rate:  89 PR Interval:  170 QRS Duration: 68 QT Interval:  340 QTC Calculation: 413 R Axis:     Text Interpretation:  Normal sinus rhythm Anteroseptal infarct , age undetermined Abnormal ECG No significant change was found Confirmed by Michai Dieppa  MD, Tascha Casares ([id number]) on 01/26/2016 10:05:25 PM         EKG Interpretation #2  Date/Time:  Wednesday January 26 2016 23:11:54 EDT Ventricular Rate:  75 PR Interval:  170 QRS Duration: 80 QT Interval:  370 QTC Calculation: 414 R Axis:   23 Text Interpretation:  Sinus rhythm Low voltage, precordial leads Anteroseptal  infarct, old Nonspecific T abnormalities, lateral leads No significant change was found Confirmed by Jonothan Heberle  MD, Caryn Bee ([id number]) on 01/26/2016 11:52:41 PM        Radiology Dg Chest 2 View  Result Date: 01/26/2016 CLINICAL DATA:  Chest pain and nausea for 2 hours. EXAM: CHEST  2 VIEW COMPARISON:  05/07/2012 FINDINGS: Single lead pacer. Left anterior descending coronary artery stent. Ill-defined 1.9 by 0.9 cm density projecting peripherally over the right mid lung. The lungs appear otherwise clear. No pleural effusion. Thoracic spondylosis. Mild enlargement of the cardiopericardial silhouette. IMPRESSION: 1. Vague 1.9 by 0.9 cm density projecting over the right sixth rib, technically nonspecific  although probably due to atelectasis or possibly early pneumonia. This warrants follow up to ensure that it is not a neoplastic process, I would recommend either chest CT canal or a follow up chest radiograph in 6 weeks time to assess for clearance. 2. Mild enlargement of the cardiopericardial silhouette, without edema. Electronically Signed   By: Gaylyn Rong M.D.   On: 01/26/2016 20:36    Procedures Procedures (including critical care time)  Medications Ordered in ED Medications - No data to display   Initial Impression / Assessment and Plan / ED Course  I have reviewed the triage vital signs and the nursing notes.  Pertinent labs & imaging results that were available during my care of the patient were reviewed by me and considered in my medical decision making (see chart for details).  Clinical Course    Patient is overall well appearing at this time.  He is without any active chest pain.  His initial troponin EKG are without abnormality.  Repeat troponin repeat EKG remained normal.  He would like to go home.  I think this is reasonable.  I told the patient he'll need to call his cardiologist for follow-up.  He will need to return the emergency department for recurrent chest discomfort.  He  understands all this.  Family updated as well.  He does have a slight abnormality on his chest x-Radick.  He'll need follow-up repeat imaging in 4-6 weeks.  This was described to the patient and the patient's spouse and they understand the need for repeat x-[place].  Doubt PE/DVT.  Low suspicion for ACS.    Final Clinical Impressions(s) / ED Diagnoses   Final diagnoses:  Chest pain, unspecified chest pain type    New Prescriptions New Prescriptions   No medications on file     Azalia Bilis, MD 01/26/16 [id number]

## 2016-01-26 NOTE — ED Notes (Signed)
Pt. became upset with nurse after updating pt. on wait time and process and stated that he is leaving .

## 2016-01-26 NOTE — ED Triage Notes (Signed)
Pt. reports intermittent central chest pain with mild SOB and nausea onset this afternoon , pt. received 2 baby ASA and 2 NTG sl with relief , no chest pain at arrival , his cardiologist is Dr. Duke Salviaandolph .

## 2016-01-27 NOTE — ED Notes (Signed)
Pt left at this time with all belongings.  

## 2016-02-15 ENCOUNTER — Ambulatory Visit (INDEPENDENT_AMBULATORY_CARE_PROVIDER_SITE_OTHER): Payer: Medicare Other | Admitting: Internal Medicine

## 2016-02-15 ENCOUNTER — Encounter: Payer: Self-pay | Admitting: Internal Medicine

## 2016-02-15 VITALS — BP 122/86 | HR 76 | Temp 97.9°F | Resp 16 | Ht 66.0 in | Wt 203.0 lb

## 2016-02-15 DIAGNOSIS — Z23 Encounter for immunization: Secondary | ICD-10-CM | POA: Diagnosis not present

## 2016-02-15 DIAGNOSIS — H9201 Otalgia, right ear: Secondary | ICD-10-CM | POA: Diagnosis not present

## 2016-02-15 NOTE — Assessment & Plan Note (Signed)
Improving on its own, does not require medication. No signs of sinus drainage. Advised that he can use zyrtec if he wants or the pain worsens.

## 2016-02-15 NOTE — Addendum Note (Signed)
Addended by: Conception ChancyOBERSON, Shemica Meath R on: 02/15/2016 10:50 AM   Modules accepted: Orders

## 2016-02-15 NOTE — Progress Notes (Signed)
Pre visit review using our clinic review tool, if applicable. No additional management support is needed unless otherwise documented below in the visit note. 

## 2016-02-15 NOTE — Patient Instructions (Signed)
We have given you the flu and the tetanus shot today. Call us back if the ear is worse.

## 2016-02-15 NOTE — Progress Notes (Signed)
   Subjective:    Patient ID: Spencer Horn, male    DOB: [birth year], 65 y.o.   MRN: [id number]  HPI The patient is a 65 YO man coming in for right ear pain. Going on for 3 days. At the onset he started cleaning out his ears with hydrogen peroxide and this helped some. He is not having as much pain now. No fevers or chills. Minimal to no nasal or congestion symptoms. No sore throat or cough or chest pains. No neck swelling.   Review of Systems  Constitutional: Negative for activity change, appetite change, fatigue, fever and unexpected weight change.  HENT: Positive for ear discharge and ear pain. Negative for congestion, facial swelling, hearing loss, rhinorrhea, sinus pressure, sneezing, tinnitus and trouble swallowing.   Eyes: Negative.   Respiratory: Negative for cough, chest tightness, shortness of breath and wheezing.   Cardiovascular: Negative for chest pain, palpitations and leg swelling.  Gastrointestinal: Negative for abdominal distention, abdominal pain, constipation, diarrhea and nausea.      Objective:   Physical Exam  Constitutional: He is oriented to person, place, and time. He appears well-developed and well-nourished.  Overweight  HENT:  Head: Normocephalic and atraumatic.  Right Ear: External ear normal.  Left Ear: External ear normal.  Mouth/Throat: Oropharynx is clear and moist.  Eyes: EOM are normal.  Neck: Normal range of motion. No JVD present.  Cardiovascular: Normal rate and regular rhythm.   Pulmonary/Chest: Effort normal and breath sounds normal. No respiratory distress. He has no wheezes. He has no rales.  Abdominal: Soft. He exhibits no distension. There is no tenderness. There is no rebound.  Lymphadenopathy:    He has no cervical adenopathy.  Neurological: He is alert and oriented to person, place, and time. Coordination normal.  Skin: Skin is warm and dry.   Vitals:   02/15/16 1003  BP: 122/86  Pulse: 76  Resp: 16  Temp: 97.9 F (36.6 C)    TempSrc: Oral  SpO2: 97%  Weight: 203 lb (92.1 kg)  Height: 5\' 6"  (1.676 m)      Assessment & Plan:  Tdap and flu given at visit.

## 2016-03-01 ENCOUNTER — Other Ambulatory Visit: Payer: Self-pay | Admitting: *Deleted

## 2016-03-01 MED ORDER — LOSARTAN POTASSIUM 25 MG PO TABS
25.0000 mg | ORAL_TABLET | Freq: Every day | ORAL | 2 refills | Status: DC
Start: 2016-03-01 — End: 2016-06-21

## 2016-04-12 LAB — COMPLETE METABOLIC PANEL WITH GFR
ALBUMIN: 3.9 g/dL (ref 3.6–5.1)
ALK PHOS: 70 U/L (ref 40–115)
ALT: 13 U/L (ref 9–46)
AST: 21 U/L (ref 10–35)
BUN: 15 mg/dL (ref 7–25)
CO2: 26 mmol/L (ref 20–31)
Calcium: 9 mg/dL (ref 8.6–10.3)
Chloride: 102 mmol/L (ref 98–110)
Creat: 0.97 mg/dL (ref 0.70–1.25)
GFR, EST NON AFRICAN AMERICAN: 83 mL/min (ref >=60)
GLUCOSE: 96 mg/dL (ref 65–99)
POTASSIUM: 4.6 mmol/L (ref 3.5–5.3)
SODIUM: 138 mmol/L (ref 135–146)
Total Bilirubin: 0.8 mg/dL (ref 0.2–1.2)
Total Protein: 6.9 g/dL (ref 6.1–8.1)

## 2016-04-12 LAB — LIPID PANEL
CHOL/HDL RATIO: 3.3 ratio (ref <5.0)
Cholesterol: 108 mg/dL (ref <200)
HDL: 33 mg/dL — AB (ref >40)
LDL CALC: 51 mg/dL (ref <100)
TRIGLYCERIDES: 119 mg/dL (ref <150)
VLDL: 24 mg/dL (ref <30)

## 2016-04-16 NOTE — Progress Notes (Signed)
Cardiology Office Note   Date:  04/17/2016   ID:  Spencer Horn, DOB 07-14-1950, MRN [id number]  PCP:  Myrlene BrokerElizabeth A Crawford, MD  Cardiologist:   Chilton Siiffany Livingston Wheeler, MD   Chief Complaint  Patient presents with  . Follow-up     History of Present Illness: Spencer Horn is a 65 y.o. male with CAD s/p MI, hypertension, chronic systolic and diastolic heart failure, and hyperlipidemia who presents for follow up.  Spencer Horn was previously a patient of [name].  He had an MI in 2004 and 06/2010.  The second MI was treated with DES to the LAD.  He then had LVEF 35% and subsequently underwent implantation of an ICD.  Spencer Horn has been doing well.  Since his last appointment, Spencer Horn was seen in the ED 01/2016 with chest pain.  The symptoms improved with two sublingual nitoglycerin.  EKG didn't show any acute changes and cardiac enzymes were negative x2.  He was encouraged to follow up with cardiology as an outpatient. In retrospect he Thinks that the chest pain was due to stress that was going on in this home at the time. He denies any recurrent chest pain since that time. He also denies shortness of breath, lower extremity edema, orthopnea, or PND. He has not been formally exercising but does work outside in his yard most days of the week. He has no exertional symptoms with this activity.  Spencer Horn likes to get his labs drawn before his appointment.     Past Medical History:  Diagnosis Date  . Coronary artery disease   . Dyslipidemia   . History of acute anterior wall MI   . Hyperlipidemia   . ICD (implantable cardiac defibrillator) in place June 2012  . Ischemic cardiomyopathy   . Personal history of MRSA (methicillin resistant Staphylococcus aureus)   . Tobacco abuse   . Ventricular dysfunction 07/07/2010   Severe left ventricular dysfunction with ejection fraction of 30-35%    Past Surgical History:  Procedure Laterality Date  . CARDIAC CATHETERIZATION  07/07/2010   DES to proximal  LAD  . CARDIAC CATHETERIZATION  2004  . EP IMPLANTABLE DEVICE  June 2012     Current Outpatient Prescriptions  Medication Sig Dispense Refill  . aspirin 81 MG tablet Take 81 mg by mouth daily.      [name]. atorvastatin (LIPITOR) 40 MG tablet Take 1 tablet (40 mg total) by mouth daily. 30 tablet 11  . carvedilol (COREG) 6.25 MG tablet Take 0.5 tablets (3.125 mg total) by mouth 2 (two) times daily. 30 tablet 11  . losartan (COZAAR) 25 MG tablet Take 1 tablet (25 mg total) by mouth daily. 90 tablet 2  . Multiple Vitamin (MULTIVITAMIN) tablet Take 1 tablet by mouth daily.    . nitroGLYCERIN (NITROSTAT) 0.4 MG SL tablet Place 0.4 mg under the tongue every 5 (five) minutes as needed for chest pain (for chest pain).    . prasugrel (EFFIENT) 10 MG TABS tablet Take 1 tablet (10 mg total) by mouth daily. 30 tablet 8  . spironolactone (ALDACTONE) 25 MG tablet Take 0.5 tablets (12.5 mg total) by mouth daily. 15 tablet 11   No current facility-administered medications for this visit.     Allergies:   Patient has no known allergies.    Social History:  The patient  reports that he has quit smoking. His smoking use included Cigarettes. He has never used smokeless tobacco. He reports that he does not drink  alcohol or use drugs.   Family History:  The patient's family history includes Bladder Cancer in his father; Bone cancer in his mother; Diabetes in his father; Hyperlipidemia in his father; Hypertension in his mother.    ROS:  Please see the history of present illness.   Otherwise, review of systems are positive for none.   All other systems are reviewed and negative.    PHYSICAL EXAM: VS:  BP 115/81 (BP Location: Right Arm, Patient Position: Sitting, Cuff Size: Normal)   Pulse 70   Ht 5\' 6"  (1.676 m)   Wt 92.5 kg (204 lb)   SpO2 98%   BMI 32.93 kg/m  , BMI Body mass index is 32.93 kg/m. GENERAL:  Well appearing HEENT:  Pupils equal round and reactive, fundi not visualized, oral mucosa  unremarkable NECK:  No jugular venous distention, waveform within normal limits, carotid upstroke brisk and symmetric, no bruits, no thyromegaly LYMPHATICS:  No cervical adenopathy LUNGS:  Clear to auscultation bilaterally HEART:  RRR.  PMI not displaced or sustained,S1 and S2 within normal limits, no S3, no S4, no clicks, no rubs, no  murmurs ABD:  Flat, positive bowel sounds normal in frequency in pitch, no bruits, no rebound, no guarding, no midline pulsatile mass, no hepatomegaly, no splenomegaly EXT:  2 plus pulses throughout, no edema, no cyanosis no clubbing SKIN:  No rashes no nodules NEURO:  Cranial nerves II through XII grossly intact, motor grossly intact throughout PSYCH:  Cognitively intact, oriented to person place and time   EKG:  EKG is not ordered today. The ekg ordered 10/12/15 demonstrates sinus rhythm rate 68 bpm.  Low voltage limb leads and precordial leads.  Prior anteroseptal infarct.    Echo 06/18/15: Study Conclusions  - Left ventricle: LVEF is severely depressed at approximately 35 to  40% with severe hypokinesis of the mid/distal lateral, mid/distal  septal, distal inferior, apical wals. The cavity size was normal. - Ventricular septum: The contour showed systolic flattening.   Recent Labs: 01/26/2016: Hemoglobin 14.8; Platelets 176 04/11/2016: ALT 13; BUN 15; Creat 0.97; Potassium 4.6; Sodium 138    Lipid Panel    Component Value Date/Time   CHOL 108 04/11/2016 1035   TRIG 119 04/11/2016 1035   HDL 33 (L) 04/11/2016 1035   CHOLHDL 3.3 04/11/2016 1035   VLDL 24 04/11/2016 1035   LDLCALC 51 04/11/2016 1035      Wt Readings from Last 3 Encounters:  04/17/16 92.5 kg (204 lb)  02/15/16 92.1 kg (203 lb)  01/26/16 92.5 kg (204 lb)      ASSESSMENT AND PLAN:  # CAD s/p MI: Spencer Horn is doing well.  He denies angina.  We discussed stopping prasugrel, but he prefers to continue it. Continue aspirin, prasugrel, carvedilol and atorvastatin.  He was  encouraged to increase his physical activity to at least 30 minutes most days of the week.  # Chronic systolic and diastolic heart failure:   # Shortness of breath: Shortness of breath has improved and Spencer Horn does not have heart failure on exam.  Consider Entresto if he develops symptomatic heart failure in the future.  For now, continue carvedilol, losartan and spironolactone.    # Hypertension: BP well-controlled. Continue carvedilol and losartan.    # Hyperlipidemia: Continue atorvastatin.  LDL 51 03/2016.  LFTs within normal limits.    Current medicines are reviewed at length with the patient today.  The patient does not have concerns regarding medicines.  The following changes have been  made:  no change  Labs/ tests ordered today include:   No orders of the defined types were placed in this encounter.    Disposition:   FU with Michaelia Beilfuss C. Duke Salvia, MD, [facility] in 4 months    This note was written with the assistance of speech recognition software.  Please excuse any transcriptional errors.  Signed, Julietta Batterman C. Duke Salvia, MD, [facility]  04/17/2016 10:45 AM    [facility]

## 2016-04-17 ENCOUNTER — Ambulatory Visit (INDEPENDENT_AMBULATORY_CARE_PROVIDER_SITE_OTHER): Payer: Medicare Other | Admitting: Cardiovascular Disease

## 2016-04-17 ENCOUNTER — Encounter: Payer: Self-pay | Admitting: Cardiovascular Disease

## 2016-04-17 VITALS — BP 115/81 | HR 70 | Ht 66.0 in | Wt 204.0 lb

## 2016-04-17 DIAGNOSIS — I10 Essential (primary) hypertension: Secondary | ICD-10-CM

## 2016-04-17 DIAGNOSIS — I5042 Chronic combined systolic (congestive) and diastolic (congestive) heart failure: Secondary | ICD-10-CM

## 2016-04-17 DIAGNOSIS — E7800 Pure hypercholesterolemia, unspecified: Secondary | ICD-10-CM

## 2016-04-17 DIAGNOSIS — Z955 Presence of coronary angioplasty implant and graft: Secondary | ICD-10-CM | POA: Diagnosis not present

## 2016-04-17 NOTE — Patient Instructions (Signed)
Medication Instructions:  Your physician recommends that you continue on your current medications as directed. Please refer to the Current Medication list given to you today.  Labwork: NONE  Testing/Procedures: NONE  Follow-Up: Your physician recommends that you schedule a follow-up appointment in: 4 MONTH OV  If you need a refill on your cardiac medications before your next appointment, please call your pharmacy.   

## 2016-04-20 ENCOUNTER — Telehealth: Payer: Self-pay | Admitting: Cardiology

## 2016-04-20 ENCOUNTER — Ambulatory Visit (INDEPENDENT_AMBULATORY_CARE_PROVIDER_SITE_OTHER): Payer: Medicare Other | Admitting: *Deleted

## 2016-04-20 DIAGNOSIS — I255 Ischemic cardiomyopathy: Secondary | ICD-10-CM

## 2016-04-20 NOTE — Telephone Encounter (Signed)
LMOVM reminding pt to send remote transmission.   

## 2016-04-21 DIAGNOSIS — I255 Ischemic cardiomyopathy: Secondary | ICD-10-CM | POA: Diagnosis not present

## 2016-05-02 ENCOUNTER — Encounter: Payer: Self-pay | Admitting: Cardiology

## 2016-05-02 NOTE — Progress Notes (Signed)
Remote ICD transmission.   

## 2016-05-31 LAB — CUP PACEART REMOTE DEVICE CHECK
Battery Remaining Longevity: 59 mo
Battery Remaining Percentage: 51 %
Brady Statistic RV Percent Paced: 1 %
Date Time Interrogation Session: 20171202033732
HIGH POWER IMPEDANCE MEASURED VALUE: 95 Ohm
HIGH POWER IMPEDANCE MEASURED VALUE: 95 Ohm
Lead Channel Impedance Value: 430 Ohm
Lead Channel Pacing Threshold Amplitude: 1 V
Lead Channel Sensing Intrinsic Amplitude: 12 mV
Lead Channel Setting Sensing Sensitivity: 0.5 mV
MDC IDC LEAD IMPLANT DT: 20120621
MDC IDC LEAD LOCATION: 753860
MDC IDC MSMT BATTERY VOLTAGE: 2.93 V
MDC IDC MSMT LEADCHNL RV PACING THRESHOLD PULSEWIDTH: 0.5 ms
MDC IDC PG IMPLANT DT: 20120621
MDC IDC PG SERIAL: 819170
MDC IDC SET LEADCHNL RV PACING AMPLITUDE: 2.5 V
MDC IDC SET LEADCHNL RV PACING PULSEWIDTH: 0.5 ms

## 2016-06-21 ENCOUNTER — Other Ambulatory Visit: Payer: Self-pay | Admitting: Cardiovascular Disease

## 2016-06-21 MED ORDER — PRASUGREL HCL 10 MG PO TABS: 10.0000 mg | ORAL_TABLET | Freq: Every day | ORAL | 11 refills | Status: DC

## 2016-06-21 MED ORDER — LOSARTAN POTASSIUM 25 MG PO TABS: 25.0000 mg | ORAL_TABLET | Freq: Every day | ORAL | 11 refills | Status: DC

## 2016-06-21 MED ORDER — CARVEDILOL 3.125 MG PO TABS: 3.1250 mg | ORAL_TABLET | Freq: Two times a day (BID) | ORAL | 11 refills | Status: DC

## 2016-06-21 MED ORDER — NITROGLYCERIN 0.4 MG SL SUBL: 0.4000 mg | SUBLINGUAL_TABLET | SUBLINGUAL | 6 refills | Status: DC | PRN

## 2016-06-21 MED ORDER — ATORVASTATIN CALCIUM 40 MG PO TABS: 40.0000 mg | ORAL_TABLET | Freq: Every day | ORAL | 11 refills | Status: DC

## 2016-06-21 MED ORDER — SPIRONOLACTONE 25 MG PO TABS: 12.5000 mg | ORAL_TABLET | Freq: Every day | ORAL | 11 refills | Status: DC

## 2016-06-21 NOTE — Telephone Encounter (Signed)
SPOKE TO PATIENT 'S FAMILY  REQUESTED  (6) RX SENT TO PHARMACY 30 DAY SUPPLY  WITH REFILLS.   MEDICATION E-SENT

## 2016-06-21 NOTE — Telephone Encounter (Signed)
Pt need all new prescriptions with Dr Leonides Sakeandolph's name on them. They can no longer fill them with Dr Yevonne PaxBrackbill's name on them.

## 2016-06-27 ENCOUNTER — Ambulatory Visit (INDEPENDENT_AMBULATORY_CARE_PROVIDER_SITE_OTHER)
Admission: RE | Admit: 2016-06-27 | Discharge: 2016-06-27 | Disposition: A | Discharge status: Home / Self Care | Payer: Medicare Other | Source: Ambulatory Visit | Attending: Family Medicine | Admitting: Family Medicine

## 2016-06-27 ENCOUNTER — Encounter: Payer: Self-pay | Admitting: Family Medicine

## 2016-06-27 ENCOUNTER — Ambulatory Visit: Payer: Self-pay

## 2016-06-27 ENCOUNTER — Ambulatory Visit (INDEPENDENT_AMBULATORY_CARE_PROVIDER_SITE_OTHER): Payer: Medicare Other | Admitting: Family Medicine

## 2016-06-27 VITALS — BP 110/80 | HR 78 | Ht 66.0 in | Wt 209.0 lb

## 2016-06-27 DIAGNOSIS — R222 Localized swelling, mass and lump, trunk: Secondary | ICD-10-CM | POA: Diagnosis not present

## 2016-06-27 DIAGNOSIS — M25511 Pain in right shoulder: Secondary | ICD-10-CM

## 2016-06-27 DIAGNOSIS — M75101 Unspecified rotator cuff tear or rupture of right shoulder, not specified as traumatic: Secondary | ICD-10-CM | POA: Insufficient documentation

## 2016-06-27 DIAGNOSIS — M75111 Incomplete rotator cuff tear or rupture of right shoulder, not specified as traumatic: Secondary | ICD-10-CM | POA: Diagnosis not present

## 2016-06-27 NOTE — Assessment & Plan Note (Signed)
Patient given injection and tolerated the injection well today. Does have some mild weakness. Patient would not be a surgical candidate with him having significant cardiac problems at the moment. We discussed icing regimen. We discussed home exercises. We discussed topical anti-inflammatories and we'll avoid oral anti-inflammatories. Patient and will come back and see me again in 4 weeks. X-rays the patient's neck to rule out cervical radiculopathy and x-rays of patient's shoulder also ordered. Chest x-Depasquale ordered to further evaluate small calcific mass seen in September.

## 2016-06-27 NOTE — Progress Notes (Signed)
[name] [address] [place], [name] [address] Phone: [phone] Subjective:    I'm seeing this patient by the request  of:  Myrlene Broker, MD  CC: Right arm pain  [id number]  Spencer Horn is a 66 y.o. male coming in with complaint of right arm pain. States that it seems intermittent. Increasing in frequency. Has noticed that certain things holding has become more difficult. Patient denies any significant radiation of pain but can intermittently have a go to the elbow. Mild weakness. Rates the severity pain is 7 out of 10. Can radiate towards the elbow. Does not take anti-inflammatories secondary to patient's heart.  Past Medical History:  Diagnosis Date  . Coronary artery disease   . Dyslipidemia   . History of acute anterior wall MI   . Hyperlipidemia   . ICD (implantable cardiac defibrillator) in place June 2012  . Ischemic cardiomyopathy   . Personal history of MRSA (methicillin resistant Staphylococcus aureus)   . Tobacco abuse   . Ventricular dysfunction 07/07/2010   Severe left ventricular dysfunction with ejection fraction of 30-35%   Past Surgical History:  Procedure Laterality Date  . CARDIAC CATHETERIZATION  07/07/2010   DES to proximal LAD  . CARDIAC CATHETERIZATION  2004  . EP IMPLANTABLE DEVICE  June 2012   Social History   Social History  . Marital status: Married    Spouse name: N/A  . Number of children: N/A  . Years of education: N/A   Social History Main Topics  . Smoking status: Former Smoker    Types: Cigarettes  . Smokeless tobacco: Never Used  . Alcohol use No  . Drug use: No  . Sexual activity: Yes   Other Topics Concern  . Not on file   Social History Narrative  . No narrative on file   No Known Allergies Family History  Problem Relation Age of Onset  . Hypertension Mother   . Bone cancer Mother   . Hyperlipidemia Father   . Bladder Cancer Father   . Diabetes Father     Past  medical history, social, surgical and family history all reviewed in electronic medical record.  No pertanent information unless stated regarding to the chief complaint.   Review of Systems:Review of systems updated and as accurate as of 06/27/16  No headache, visual changes, nausea, vomiting, diarrhea, constipation, dizziness, abdominal pain, skin rash, fevers, chills, night sweats, weight loss, swollen lymph nodes, body aches, joint swelling, muscle aches, chest pain, shortness of breath, mood changes.   Objective  There were no vitals taken for this visit. Systems examined below as of 06/27/16   General: No apparent distress alert and oriented x3 mood and affect normal, dressed appropriately.  HEENT: Pupils equal, extraocular movements intact  Respiratory: Patient's speak in full sentences and does not appear short of breath  Cardiovascular: No lower extremity edema, non tender, no erythema  Skin: Warm dry intact with no signs of infection or rash on extremities or on axial skeleton.  Abdomen: Soft nontender  Neuro: Cranial nerves II through XII are intact, neurovascularly intact in all extremities with 2+ DTRs and 2+ pulses.  Lymph: No lymphadenopathy of posterior or anterior cervical chain or axillae bilaterally.  Gait normal with good balance and coordination.  MSK:  Non tender with full range of motion and good stability and symmetric strength and tone of  elbows, wrist, hip, knee and ankles bilaterally. Arthritic changes of multiple joints. Neck: Inspection unremarkable.  No palpable stepoffs. Negative Spurling's maneuver. Tightness noted with range of motion. Grip strength and sensation normal in bilateral hands Strength good C4 to T1 distribution No sensory change to C4 to T1 Negative Hoffman sign bilaterally Reflexes normal  Shoulder: Right Inspection reveals no abnormalities, atrophy or asymmetry. Palpation is normal with no tenderness over AC joint or bicipital  groove. ROM is full in all planes passively. Rotator cuff strength 4 out of 5 compared to the contralateral side signs of impingement with positive Neer and Hawkin's tests, but negative empty can sign. Speeds and Yergason's tests normal. No labral pathology noted with negative Obrien's, negative clunk and good stability. Normal scapular function observed. No painful arc and no drop arm sign. No apprehension sign Contralateral shoulder unremarkable  MSK US performed of: Right This study was ordered, performed, and interpreted by Terrilee FilesZach Smith D.O.  Shoulder:   Supraspinatus: Degenerative tear noted  Infraspinatus:  Appears normal on long and transverse views. S Subscapularis:  Small partial-thickness tear with minimal retraction of the articular side.  Teres Minor:  Appears normal on long and transverse views. AC joint:  Mild to moderate arthritis Glenohumeral Joint:  Appears normal without effusion. Glenoid Labrum:  Intact without visualized tears. Biceps Tendon:  Appears normal on long and transverse views, no fraying of tendon, tendon located in intertubercular groove, no subluxation with shoulder internal or external rotation.  Impression: Chronic rotator cuff tear  Procedure: Real-time Ultrasound Guided Injection of right glenohumeral joint Device: GE Logiq E  Ultrasound guided injection is preferred based studies that show increased duration, increased effect, greater accuracy, decreased procedural pain, increased response rate with ultrasound guided versus blind injection.  Verbal informed consent obtained.  Time-out conducted.  Noted no overlying erythema, induration, or other signs of local infection.  Skin prepped in a sterile fashion.  Local anesthesia: Topical Ethyl chloride.  With sterile technique and under real time ultrasound guidance:  Joint visualized.  23g 1  inch needle inserted posterior approach. Pictures taken for needle placement. Patient did have injection of 2  cc of 1% lidocaine, 2 cc of 0.5% Marcaine, and 1.0 cc of Kenalog 40 mg/dL. Completed without difficulty  Pain immediately resolved suggesting accurate placement of the medication.  Advised to call if fevers/chills, erythema, induration, drainage, or persistent bleeding.  Images permanently stored and available for review in the ultrasound unit.  Impression: Technically successful ultrasound guided injection.  Procedure note 97110; 15 minutes spent for Therapeutic exercises as stated in above notes.  This included exercises focusing on stretching, strengthening, with significant focus on eccentric aspects.  Shoulder Exercises that included:  Basic scapular stabilization to include adduction and depression of scapula Scaption, focusing on proper movement and good control Internal and External rotation utilizing a theraband, with elbow tucked at side entire time Rows with theraband  Proper technique shown and discussed handout in great detail with ATC.  All questions were discussed and answered.     Impression and Recommendations:     This case required medical decision making of moderate complexity.      Note: This dictation was prepared with Dragon dictation along with smaller phrase technology. Any transcriptional errors that result from this process are unintentional.

## 2016-06-27 NOTE — Assessment & Plan Note (Signed)
Seen on x-Ivins in September. We'll rule out any type of enlargement that would need further evaluation such as a CT scan. Patient did used to smoke.

## 2016-06-27 NOTE — Patient Instructions (Signed)
Good to see you.  Ice 20 minutes 2 times daily. Usually after activity and before bed. pennsaid pinkie amount topically 2 times daily as needed.  Gabapentin 200mg  at night Xrays downstairs to check the neck and the chest to make sure the density went away Injected the shoulder today and should help Exercises 3 times a week.  See me again in 4 weeks.

## 2016-08-01 ENCOUNTER — Ambulatory Visit (INDEPENDENT_AMBULATORY_CARE_PROVIDER_SITE_OTHER): Payer: Medicare Other | Admitting: *Deleted

## 2016-08-01 DIAGNOSIS — I255 Ischemic cardiomyopathy: Secondary | ICD-10-CM

## 2016-08-01 NOTE — Progress Notes (Signed)
Remote ICD transmission.   

## 2016-08-02 ENCOUNTER — Encounter: Payer: Self-pay | Admitting: Cardiology

## 2016-08-04 LAB — CUP PACEART REMOTE DEVICE CHECK
Battery Remaining Longevity: 56 mo
Date Time Interrogation Session: 20180313081723
HIGH POWER IMPEDANCE MEASURED VALUE: 93 Ohm
HighPow Impedance: 93 Ohm
Implantable Lead Implant Date: 20120621
Implantable Pulse Generator Implant Date: 20120621
Lead Channel Setting Pacing Amplitude: 2.5 V
Lead Channel Setting Sensing Sensitivity: 0.5 mV
MDC IDC LEAD LOCATION: 753860
MDC IDC MSMT BATTERY REMAINING PERCENTAGE: 49 %
MDC IDC MSMT BATTERY VOLTAGE: 2.93 V
MDC IDC MSMT LEADCHNL RV IMPEDANCE VALUE: 380 Ohm
MDC IDC MSMT LEADCHNL RV PACING THRESHOLD AMPLITUDE: 1 V
MDC IDC MSMT LEADCHNL RV PACING THRESHOLD PULSEWIDTH: 0.5 ms
MDC IDC MSMT LEADCHNL RV SENSING INTR AMPL: 12 mV
MDC IDC PG SERIAL: 819170
MDC IDC SET LEADCHNL RV PACING PULSEWIDTH: 0.5 ms
MDC IDC STAT BRADY RV PERCENT PACED: 1 %

## 2016-08-16 ENCOUNTER — Encounter: Payer: Self-pay | Admitting: Cardiovascular Disease

## 2016-08-16 ENCOUNTER — Ambulatory Visit (INDEPENDENT_AMBULATORY_CARE_PROVIDER_SITE_OTHER): Payer: Medicare Other | Admitting: Cardiovascular Disease

## 2016-08-16 ENCOUNTER — Telehealth: Payer: Self-pay | Admitting: Internal Medicine

## 2016-08-16 ENCOUNTER — Other Ambulatory Visit: Payer: Self-pay | Admitting: Cardiovascular Disease

## 2016-08-16 VITALS — BP 117/83 | HR 76 | Ht 66.0 in | Wt 207.0 lb

## 2016-08-16 DIAGNOSIS — R5383 Other fatigue: Secondary | ICD-10-CM | POA: Diagnosis not present

## 2016-08-16 DIAGNOSIS — E785 Hyperlipidemia, unspecified: Secondary | ICD-10-CM | POA: Diagnosis not present

## 2016-08-16 DIAGNOSIS — I5042 Chronic combined systolic (congestive) and diastolic (congestive) heart failure: Secondary | ICD-10-CM

## 2016-08-16 DIAGNOSIS — I255 Ischemic cardiomyopathy: Secondary | ICD-10-CM

## 2016-08-16 DIAGNOSIS — Z955 Presence of coronary angioplasty implant and graft: Secondary | ICD-10-CM | POA: Diagnosis not present

## 2016-08-16 DIAGNOSIS — I110 Hypertensive heart disease with heart failure: Secondary | ICD-10-CM | POA: Diagnosis not present

## 2016-08-16 DIAGNOSIS — N5201 Erectile dysfunction due to arterial insufficiency: Secondary | ICD-10-CM

## 2016-08-16 DIAGNOSIS — R0602 Shortness of breath: Secondary | ICD-10-CM

## 2016-08-16 LAB — CBC WITH DIFFERENTIAL/PLATELET
Basophils Absolute: 0 cells/uL (ref 0–200)
Basophils Relative: 0 %
Eosinophils Absolute: 225 cells/uL (ref 15–500)
Eosinophils Relative: 3 %
HEMATOCRIT: 45.1 % (ref 38.5–50.0)
Hemoglobin: 15 g/dL (ref 13.2–17.1)
LYMPHS PCT: 39 %
Lymphs Abs: 2925 cells/uL (ref 850–3900)
MCH: 28.5 pg (ref 27.0–33.0)
MCHC: 33.3 g/dL (ref 32.0–36.0)
MCV: 85.7 fL (ref 80.0–100.0)
MONO ABS: 675 {cells}/uL (ref 200–950)
MONOS PCT: 9 %
MPV: 9.8 fL (ref 7.5–12.5)
Neutro Abs: 3675 cells/uL (ref 1500–7800)
Neutrophils Relative %: 49 %
PLATELETS: 189 10*3/uL (ref 140–400)
RBC: 5.26 MIL/uL (ref 4.20–5.80)
RDW: 13.9 % (ref 11.0–15.0)
WBC: 7.5 10*3/uL (ref 3.8–10.8)

## 2016-08-16 MED ORDER — SILDENAFIL CITRATE 25 MG PO TABS: 25.0000 mg | ORAL_TABLET | ORAL | 2 refills | Status: DC | PRN

## 2016-08-16 NOTE — Patient Instructions (Addendum)
Medication Instructions:  RX FOR SILDENAFIL 30 MINUTES TO 4 HOURS PRIOR SENT TO PHARMACY. DO NOT USE WITH YOUR NITROGLYCERIN    Labwork: LABS AT YOUR PRIMARY CARE OFFICE SOON  Testing/Procedures: NONE  Follow-Up: Your physician wants you to follow-up in: 4 MONTH OV You will receive a reminder letter in the mail two months in advance. If you don't receive a letter, please call our office to schedule the follow-up appointment.  If you need a refill on your cardiac medications before your next appointment, please call your pharmacy.

## 2016-08-16 NOTE — Telephone Encounter (Signed)
Pt's Spouse, Spencer Horn, came by our office stating Cardiology sent [name] here for Dr. Okey Duprerawford to put orders in for labs to be drawn by the lab here [facility] [address] since Cardiology no longer has a lab.  I explained to Mrs. Spencer Horn Dr. Okey Duprerawford cannot do this since she was not the ordering MD.

## 2016-08-16 NOTE — Progress Notes (Signed)
Cardiology Office Note   Date:  08/18/2016   ID:  Spencer Horn, DOB [birth year], MRN [id number]  PCP:  Myrlene Broker, MD  Cardiologist:   Chilton Si, MD   Chief Complaint  Patient presents with  . Follow-up    4 month; Pt states no Sx.      History of Present Illness: Spencer Horn is a 66 y.o. male with CAD s/p MI, hypertension, chronic systolic and diastolic heart failure, and hyperlipidemia who presents for follow up.  Spencer Horn was previously a patient of Dr. [name].  He had an MI in 2004 and 06/2010.  The second MI was treated with DES to the LAD.  He then had LVEF 35% and subsequently underwent implantation of an ICD.  Spencer Horn has been doing well.  Since his last appointment, Spencer Horn was seen in the ED 01/2016 with chest pain.  The symptoms improved with two sublingual nitoglycerin.  EKG didn't show any acute changes and cardiac enzymes were negative x2.  In retrospect he thinks it was related to stress.  He hasn't experienced any recurrent chest pain.  He notes general fatigue and erectile dysfunction.  He also denies shortness of breath, lower extremity edema, orthopnea, or PND.  He hopes to start exercising more now that the weather is improving.     Past Medical History:  Diagnosis Date  . Coronary artery disease   . Dyslipidemia   . Erectile dysfunction 08/18/2016  . History of acute anterior wall MI   . Hyperlipidemia   . ICD (implantable cardiac defibrillator) in place June 2012  . Ischemic cardiomyopathy   . Personal history of MRSA (methicillin resistant Staphylococcus aureus)   . Tobacco abuse   . Ventricular dysfunction 07/07/2010   Severe left ventricular dysfunction with ejection fraction of 30-35%    Past Surgical History:  Procedure Laterality Date  . CARDIAC CATHETERIZATION  07/07/2010   DES to proximal LAD  . CARDIAC CATHETERIZATION  2004  . EP IMPLANTABLE DEVICE  June 2012     Current Outpatient Prescriptions  Medication Sig Dispense  Refill  . aspirin 81 MG tablet Take 81 mg by mouth daily.      [name] atorvastatin (LIPITOR) 40 MG tablet Take 1 tablet (40 mg total) by mouth daily. 30 tablet 11  . carvedilol (COREG) 3.125 MG tablet Take 1 tablet (3.125 mg total) by mouth 2 (two) times daily. 60 tablet 11  . losartan (COZAAR) 25 MG tablet Take 1 tablet (25 mg total) by mouth daily. 30 tablet 11  . Multiple Vitamin (MULTIVITAMIN) tablet Take 1 tablet by mouth daily.    . nitroGLYCERIN (NITROSTAT) 0.4 MG SL tablet Place 1 tablet (0.4 mg total) under the tongue every 5 (five) minutes as needed for chest pain (for chest pain). 25 tablet 6  . prasugrel (EFFIENT) 10 MG TABS tablet Take 1 tablet (10 mg total) by mouth daily. 30 tablet 11  . sildenafil (VIAGRA) 25 MG tablet Take 25 mg by mouth as needed.    [name] spironolactone (ALDACTONE) 25 MG tablet Take 0.5 tablets (12.5 mg total) by mouth daily. 15 tablet 11   No current facility-administered medications for this visit.     Allergies:   Patient has no known allergies.    Social History:  The patient  reports that he has quit smoking. His smoking use included Cigarettes. He has never used smokeless tobacco. He reports that he does not drink alcohol or use drugs.  Family History:  The patient's family history includes Bladder Cancer in his father; Bone cancer in his mother; Diabetes in his father; Hyperlipidemia in his father; Hypertension in his mother.    ROS:  Please see the history of present illness.   Otherwise, review of systems are positive for none.   All other systems are reviewed and negative.    PHYSICAL EXAM: VS:  BP 117/83   Pulse 76   Ht 5\' 6"  (1.676 m)   Wt 93.9 kg (207 lb)   BMI 33.41 kg/m  , BMI Body mass index is 33.41 kg/m. GENERAL:  Well appearing HEENT:  Pupils equal round and reactive, fundi not visualized, oral mucosa unremarkable NECK:  No jugular venous distention, waveform within normal limits, carotid upstroke brisk and symmetric, no  bruits LYMPHATICS:  No cervical adenopathy LUNGS:  Clear to auscultation bilaterally HEART:  RRR.  PMI not displaced or sustained,S1 and S2 within normal limits, no S3, no S4, no clicks, no rubs, no  murmurs ABD:  Flat, positive bowel sounds normal in frequency in pitch, no bruits, no rebound, no guarding, no midline pulsatile mass, no hepatomegaly, no splenomegaly EXT:  2 plus pulses throughout, no edema, no cyanosis no clubbing SKIN:  No rashes no nodules NEURO:  Cranial nerves II through XII grossly intact, motor grossly intact throughout PSYCH:  Cognitively intact, oriented to person place and time   EKG:  EKG is ordered today. The ekg ordered 10/12/15 demonstrates sinus rhythm rate 68 bpm.  Low voltage limb leads and precordial leads.  Prior anteroseptal infarct.   08/16/16: sinus rhythm.  Rate 76 bpm.  Prior anteroseptal infarct.  Lateral TWI.   Echo 06/18/15: Study Conclusions  - Left ventricle: LVEF is severely depressed at approximately 35 to  40% with severe hypokinesis of the mid/distal lateral, mid/distal  septal, distal inferior, apical wals. The cavity size was normal. - Ventricular septum: The contour showed systolic flattening.   Recent Labs: 04/11/2016: ALT 13; BUN 15; Creat 0.97; Potassium 4.6; Sodium 138 08/16/2016: Hemoglobin 15.0; Platelets 189; TSH 1.16    Lipid Panel    Component Value Date/Time   CHOL 108 04/11/2016 1035   TRIG 119 04/11/2016 1035   HDL 33 (L) 04/11/2016 1035   CHOLHDL 3.3 04/11/2016 1035   VLDL 24 04/11/2016 1035   LDLCALC 51 04/11/2016 1035      Wt Readings from Last 3 Encounters:  08/16/16 93.9 kg (207 lb)  06/27/16 94.8 kg (209 lb)  04/17/16 92.5 kg (204 lb)      ASSESSMENT AND PLAN:  # Fatigue: Spencer Horn reports fatigue.  We will check thyroid function, CBC, and a vitamin D level.  Given his report of erectile dysfunction we will also check his testoterone level.  I also recommended that he increase his exercise, as this  will likely improve his energy levels  # Sexual dysfunction:  Check testosterone and start Sildenafil 25mg  prn he knows that nitroglycerin cannot be taking this medication..    # CAD s/p MI: Spencer Horn is doing well.  He denies angina. Continue aspirin, prasugrel, carvedilol and atorvastatin.  OK to stop prasugrel, but he prefers to continue.  He was encouraged to increase his physical activity to at least 30 minutes most days of the week.  # Chronic systolic and diastolic heart failure:   # Shortness of breath: Shortness of breath has improved and Spencer Horn does not have heart failure on exam.  Consider Entresto if he develops symptomatic heart failure  in the future.  For now, continue carvedilol, losartan and spironolactone.    # Hypertension: BP well-controlled. Continue carvedilol and losartan.    # Hyperlipidemia: Continue atorvastatin.  LDL 51 03/2016.  LFTs within normal limits.    Current medicines are reviewed at length with the patient today.  The patient does not have concerns regarding medicines.  The following changes have been made:  no change  Labs/ tests ordered today include:   Orders Placed This Encounter  Procedures  . CBC with Differential/Platelet  . T4, free  . TSH  . Vitamin D (25 hydroxy)  . Testosterone  . EKG 12-Lead     Disposition:   FU with Spencer Villarruel C. Duke Salviaandolph, MD, [facility] in 4 months    This note was written with the assistance of speech recognition software.  Please excuse any transcriptional errors.  Signed, Braiden Presutti C. Duke Salviaandolph, MD, [facility]  08/18/2016 9:32 AM    [facility]

## 2016-08-17 LAB — VITAMIN D 25 HYDROXY (VIT D DEFICIENCY, FRACTURES): Vit D, 25-Hydroxy: 30 ng/mL (ref 30–100)

## 2016-08-17 LAB — TSH: TSH: 1.16 mIU/L (ref 0.40–4.50)

## 2016-08-17 LAB — T4, FREE: Free T4: 1.2 ng/dL (ref 0.8–1.8)

## 2016-08-17 LAB — TESTOSTERONE: Testosterone: 409 ng/dL (ref 250–827)

## 2016-08-18 ENCOUNTER — Encounter: Payer: Self-pay | Admitting: Cardiovascular Disease

## 2016-08-18 DIAGNOSIS — N529 Male erectile dysfunction, unspecified: Secondary | ICD-10-CM

## 2016-08-18 HISTORY — DX: Male erectile dysfunction, unspecified: N52.9

## 2016-08-23 NOTE — Telephone Encounter (Signed)
Handled with Engineer, mining, confirmed we cannot draw labs based on their orders alone.

## 2016-09-15 ENCOUNTER — Telehealth: Payer: Self-pay | Admitting: Cardiology

## 2016-09-15 NOTE — Telephone Encounter (Signed)
Spoke w/ pt wife and requested that pt send a remote transmission b/c his home monitor has not updated in the last 7 days.

## 2016-10-31 ENCOUNTER — Ambulatory Visit (INDEPENDENT_AMBULATORY_CARE_PROVIDER_SITE_OTHER): Payer: Medicare Other | Admitting: *Deleted

## 2016-10-31 DIAGNOSIS — I255 Ischemic cardiomyopathy: Secondary | ICD-10-CM | POA: Diagnosis not present

## 2016-10-31 NOTE — Progress Notes (Signed)
Remote ICD transmission.   

## 2016-11-01 ENCOUNTER — Encounter: Payer: Self-pay | Admitting: Cardiology

## 2016-11-01 LAB — CUP PACEART REMOTE DEVICE CHECK
Battery Remaining Percentage: 48 %
Battery Voltage: 2.93 V
Date Time Interrogation Session: 20180612080033
HighPow Impedance: 90 Ohm
HighPow Impedance: 90 Ohm
Implantable Lead Implant Date: 20120621
Implantable Lead Location: 753860
Lead Channel Impedance Value: 380 Ohm
Lead Channel Pacing Threshold Pulse Width: 0.5 ms
Lead Channel Sensing Intrinsic Amplitude: 12 mV
MDC IDC MSMT BATTERY REMAINING LONGEVITY: 54 mo
MDC IDC MSMT LEADCHNL RV PACING THRESHOLD AMPLITUDE: 1 V
MDC IDC PG IMPLANT DT: 20120621
MDC IDC SET LEADCHNL RV PACING AMPLITUDE: 2.5 V
MDC IDC SET LEADCHNL RV PACING PULSEWIDTH: 0.5 ms
MDC IDC SET LEADCHNL RV SENSING SENSITIVITY: 0.5 mV
MDC IDC STAT BRADY RV PERCENT PACED: 1 %
Pulse Gen Serial Number: 819170

## 2016-12-17 NOTE — Progress Notes (Signed)
Cardiology Office Note   Date:  12/18/2016   ID:  Spencer Horn, DOB 1951/03/09, MRN [id number]  PCP:  [name], [name], MD  Cardiologist:   [name], MD  Electrophysiologist: [name], MD  Chief Complaint  Patient presents with  . Follow-up    4 MONTHS;     History of Present Illness: Spencer Horn is a 66 y.o. male with CAD s/p MI, hypertension, chronic systolic and diastolic heart failure,erectile dysfunction, and hyperlipidemia who presents for follow up.  [name] was previously a patient of [name].  He had an MI in 2004 and 06/2010.  The second MI was treated with DES to the LAD.  He then had LVEF 35% and subsequently underwent implantation of an ICD.  At his last appointment [name] reported fatigue.  Blood counts, thyroid function, vitamin D and testosterone levels were all within normal limits.  He received a prescription for sildenafil but denied loss. He is other wise been feeling well. He continues to have some mild exertional dyspnea that is unchanged. He denies chest pain, lower extremity edema, orthopnea, or PND. He has not been exercising regularly but does sometimes plays basketball with his grandchildren.  He sometimes checks his blood pressure at home and it typically is in the 110s over 80s   Past Medical History:  Diagnosis Date  . Coronary artery disease   . Dyslipidemia   . Erectile dysfunction 08/18/2016  . History of acute anterior wall MI   . Hyperlipidemia   . ICD (implantable cardiac defibrillator) in place June 2012  . Ischemic cardiomyopathy   . Personal history of MRSA (methicillin resistant Staphylococcus aureus)   . Tobacco abuse   . Ventricular dysfunction 07/07/2010   Severe left ventricular dysfunction with ejection fraction of 30-35%    Past Surgical History:  Procedure Laterality Date  . CARDIAC CATHETERIZATION  07/07/2010   DES to proximal LAD  . CARDIAC CATHETERIZATION  2004  . EP IMPLANTABLE DEVICE  June 2012      Current Outpatient Prescriptions  Medication Sig Dispense Refill  . aspirin 81 MG tablet Take 81 mg by mouth daily.      [name]. atorvastatin (LIPITOR) 40 MG tablet Take 1 tablet (40 mg total) by mouth daily. 30 tablet 11  . carvedilol (COREG) 3.125 MG tablet Take 1 tablet (3.125 mg total) by mouth 2 (two) times daily. 60 tablet 11  . losartan (COZAAR) 25 MG tablet Take 1 tablet (25 mg total) by mouth daily. 30 tablet 11  . Multiple Vitamin (MULTIVITAMIN) tablet Take 1 tablet by mouth daily.    . nitroGLYCERIN (NITROSTAT) 0.4 MG SL tablet Place 1 tablet (0.4 mg total) under the tongue every 5 (five) minutes as needed for chest pain (for chest pain). 25 tablet 6  . prasugrel (EFFIENT) 10 MG TABS tablet Take 1 tablet (10 mg total) by mouth daily. 30 tablet 11  . spironolactone (ALDACTONE) 25 MG tablet Take 0.5 tablets (12.5 mg total) by mouth daily. 15 tablet 11   No current facility-administered medications for this visit.     Allergies:   Patient has no known allergies.    Social History:  The patient  reports that he has quit smoking. His smoking use included Cigarettes. He has never used smokeless tobacco. He reports that he does not drink alcohol or use drugs.   Family History:  The patient's family history includes Bladder Cancer in his father; Bone cancer in his mother; Diabetes in  his father; Hyperlipidemia in his father; Hypertension in his mother.    ROS:  Please see the history of present illness.   Otherwise, review of systems are positive for fatigue, diaphoresis.   All other systems are reviewed and negative.    PHYSICAL EXAM: VS:  BP 124/82   Pulse 85   Ht 5\' 6"  (1.676 m)   Wt 94.6 kg (208 lb 9.6 oz)   BMI 33.67 kg/m  , BMI Body mass index is 33.67 kg/m. GENERAL:  Well appearing.  No acute distress HEENT: Pupils equal round and reactive, fundi not visualized, oral mucosa unremarkable NECK:  No jugular venous distention, waveform within normal limits, carotid upstroke  brisk and symmetric, no bruits LUNGS:  Clear to auscultation bilaterally.  No crackles, wheezes, rhonchi HEART:  RRR.  PMI not displaced or sustained,S1 and S2 within normal limits, no S3, no S4, no clicks, no rubs, no murmurs ABD:  Flat, positive bowel sounds normal in frequency in pitch, no bruits, no rebound, no guarding, no midline pulsatile mass, no hepatomegaly, no splenomegaly EXT:  2 plus pulses throughout, no edema, no cyanosis no clubbing SKIN:  No rashes no nodules NEURO:  Cranial nerves II through XII grossly intact, motor grossly intact throughout PSYCH:  Cognitively intact, oriented to person place and time  EKG:  EKG is ordered today. The ekg ordered 10/12/15 demonstrates sinus rhythm rate 68 bpm.  Low voltage limb leads and precordial leads.  Prior anteroseptal infarct.   08/16/16: sinus rhythm.  Rate 76 bpm.  Prior anteroseptal infarct.  Lateral TWI.   Echo 06/18/15: Study Conclusions  - Left ventricle: LVEF is severely depressed at approximately 35 to  40% with severe hypokinesis of the mid/distal lateral, mid/distal  septal, distal inferior, apical wals. The cavity size was normal. - Ventricular septum: The contour showed systolic flattening.   Recent Labs: 04/11/2016: ALT 13; BUN 15; Creat 0.97; Potassium 4.6; Sodium 138 08/16/2016: Hemoglobin 15.0; Platelets 189; TSH 1.16    Lipid Panel    Component Value Date/Time   CHOL 108 04/11/2016 1035   TRIG 119 04/11/2016 1035   HDL 33 (L) 04/11/2016 1035   CHOLHDL 3.3 04/11/2016 1035   VLDL 24 04/11/2016 1035   LDLCALC 51 04/11/2016 1035       Wt Readings from Last 3 Encounters:  12/18/16 94.6 kg (208 lb 9.6 oz)  08/16/16 93.9 kg (207 lb)  06/27/16 94.8 kg (209 lb)      ASSESSMENT AND PLAN:   # CAD s/p MI: [name] continues to do well. He denies angina. He has exertional dyspnea that is stable.  Continue aspirin, prasugrel, carvedilol and atorvastatin.  OK to stop prasugrel, but he prefers to continue.      # Chronic systolic and diastolic heart failure:   # Shortness of breath: Euvolemic on exam. Consider Entresto if he develops symptomatic heart failure in the future.  Continue carvedilol, losartan and spironolactone.    # Hypertension: BP was initially elevated but better on repeat. Continue carvedilol and losartan.  # Hyperlipidemia: Continue atorvastatin.  LDL 51 03/2016.  Repeat lipids and CMP 03/2017.  # Obesity: Mr. Romulus has gained 4lb since his last appointment. It was recommended that he increase his exercise to at least 30-40 minutes most days of the week.  He was also advised to work on portion control.   Current medicines are reviewed at length with the patient today.  The patient does not have concerns regarding medicines.  The following changes  have been made:  no change  Labs/ tests ordered today include:   Orders Placed This Encounter  Procedures  . CBC with Differential/Platelet  . Lipid panel  . Comprehensive metabolic panel     Disposition:   FU with Cheryn Lundquist C. Duke Salviaandolph, MD, [facility] in 4 months    This note was written with the assistance of speech recognition software.  Please excuse any transcriptional errors.  Signed, Makensey Rego C. Duke Salviaandolph, MD, [facility]  12/18/2016 9:31 AM    [facility]

## 2016-12-18 ENCOUNTER — Encounter: Payer: Self-pay | Admitting: Cardiovascular Disease

## 2016-12-18 ENCOUNTER — Ambulatory Visit (INDEPENDENT_AMBULATORY_CARE_PROVIDER_SITE_OTHER): Payer: Medicare Other | Admitting: Cardiovascular Disease

## 2016-12-18 VITALS — BP 124/82 | HR 85 | Ht 66.0 in | Wt 208.6 lb

## 2016-12-18 DIAGNOSIS — E7800 Pure hypercholesterolemia, unspecified: Secondary | ICD-10-CM | POA: Diagnosis not present

## 2016-12-18 DIAGNOSIS — Z5181 Encounter for therapeutic drug level monitoring: Secondary | ICD-10-CM | POA: Diagnosis not present

## 2016-12-18 DIAGNOSIS — I10 Essential (primary) hypertension: Secondary | ICD-10-CM | POA: Diagnosis not present

## 2016-12-18 DIAGNOSIS — I110 Hypertensive heart disease with heart failure: Secondary | ICD-10-CM | POA: Diagnosis not present

## 2016-12-18 DIAGNOSIS — Z955 Presence of coronary angioplasty implant and graft: Secondary | ICD-10-CM | POA: Diagnosis not present

## 2016-12-18 NOTE — Patient Instructions (Signed)
Medication Instructions:  Your physician recommends that you continue on your current medications as directed. Please refer to the Current Medication list given to you today.  Labwork: FASTING LP/CMET/CBC BEFORE YOUR FOLLOW UP IN NOVEMBER  Testing/Procedures: NONE  Follow-Up: Your physician recommends that you schedule a follow-up appointment in: IN November   If you need a refill on your cardiac medications before your next appointment, please call your pharmacy.

## 2017-01-30 ENCOUNTER — Encounter: Payer: Self-pay | Admitting: Internal Medicine

## 2017-01-30 ENCOUNTER — Ambulatory Visit (INDEPENDENT_AMBULATORY_CARE_PROVIDER_SITE_OTHER): Payer: Medicare Other | Admitting: Internal Medicine

## 2017-01-30 VITALS — BP 122/78 | HR 89 | Ht 65.5 in | Wt 209.2 lb

## 2017-01-30 DIAGNOSIS — I5022 Chronic systolic (congestive) heart failure: Secondary | ICD-10-CM | POA: Diagnosis not present

## 2017-01-30 DIAGNOSIS — I255 Ischemic cardiomyopathy: Secondary | ICD-10-CM

## 2017-01-30 DIAGNOSIS — I10 Essential (primary) hypertension: Secondary | ICD-10-CM | POA: Diagnosis not present

## 2017-01-30 NOTE — Patient Instructions (Signed)

## 2017-01-30 NOTE — Progress Notes (Signed)
HPI Spencer Horn returns today for followup. He is a pleasant 66 yo man with chronic systolic heart failure, s/p MI, EF 30%, obesity, and HTN. He denies chest pain, shortness of breath, or syncope. No edema. No ICD shock. No Known Allergies   Current Outpatient Prescriptions  Medication Sig Dispense Refill  . aspirin 81 MG tablet Take 81 mg by mouth daily.      [name] atorvastatin (LIPITOR) 40 MG tablet Take 1 tablet (40 mg total) by mouth daily. 30 tablet 11  . carvedilol (COREG) 3.125 MG tablet Take 1 tablet (3.125 mg total) by mouth 2 (two) times daily. 60 tablet 11  . losartan (COZAAR) 25 MG tablet Take 1 tablet (25 mg total) by mouth daily. 30 tablet 11  . Multiple Vitamin (MULTIVITAMIN) tablet Take 1 tablet by mouth daily.    . nitroGLYCERIN (NITROSTAT) 0.4 MG SL tablet Place 1 tablet (0.4 mg total) under the tongue every 5 (five) minutes as needed for chest pain (for chest pain). 25 tablet 6  . prasugrel (EFFIENT) 10 MG TABS tablet Take 1 tablet (10 mg total) by mouth daily. 30 tablet 11  . spironolactone (ALDACTONE) 25 MG tablet Take 0.5 tablets (12.5 mg total) by mouth daily. 15 tablet 11   No current facility-administered medications for this visit.      Past Medical History:  Diagnosis Date  . Coronary artery disease   . Dyslipidemia   . Erectile dysfunction 08/18/2016  . History of acute anterior wall MI   . Hyperlipidemia   . ICD (implantable cardiac defibrillator) in place June 2012  . Ischemic cardiomyopathy   . Personal history of MRSA (methicillin resistant Staphylococcus aureus)   . Tobacco abuse   . Ventricular dysfunction 07/07/2010   Severe left ventricular dysfunction with ejection fraction of 30-35%    ROS:   All systems reviewed and negative except as noted in the HPI.   Past Surgical History:  Procedure Laterality Date  . CARDIAC CATHETERIZATION  07/07/2010   DES to proximal LAD  . CARDIAC CATHETERIZATION  2004  . EP IMPLANTABLE DEVICE  June 2012       Family History  Problem Relation Age of Onset  . Hypertension Mother   . Bone cancer Mother   . Hyperlipidemia Father   . Bladder Cancer Father   . Diabetes Father      Social History   Social History  . Marital status: Married    Spouse name: N/A  . Number of children: N/A  . Years of education: N/A   Occupational History  . Not on file.   Social History Main Topics  . Smoking status: Former Smoker    Types: Cigarettes  . Smokeless tobacco: Never Used  . Alcohol use No  . Drug use: No  . Sexual activity: Yes   Other Topics Concern  . Not on file   Social History Narrative  . No narrative on file     BP 122/78   Pulse 89   Ht 5' 5.5" (1.664 m)   Wt 209 lb 3.2 oz (94.9 kg)   SpO2 96%   BMI 34.28 kg/m   Physical Exam:  Well appearing 66 year old man, NAD HEENT: Unremarkable Neck:  6 cm JVD, no thyromegally Lymphatics:  No adenopathy Back:  No CVA tenderness Lungs:  Clear, with no wheezes, rales, or rhonchi HEART:  Regular rate rhythm, no murmurs, no rubs, no clicks Abd:  soft, obese, positive bowel sounds, no organomegally, no  rebound, no guarding Ext:  2 plus pulses, no edema, no cyanosis, no clubbing Skin:  No rashes no nodules Neuro:  CN II through XII intact, motor grossly intact  DEVICE  Normal device function.  See PaceArt for details.   Assess/Plan: 1. Chronic systolic heart failure - his symptoms remain class II. I've encouraged the patient to increase his physical activity. 2. Ischemic cardiomyopathy - he is status post anterior MI. He denies anginal symptoms. He is encouraged to take his medications and lose weight. 3. ICD - his [employer] single-chamber ICD is working normally. We'll plan to recheck in several months. He has had no ICD therapies.  Spencer Horn, M.D.

## 2017-02-01 LAB — CUP PACEART INCLINIC DEVICE CHECK
Battery Remaining Longevity: 51 mo
Date Time Interrogation Session: 20180911145748
HighPow Impedance: 93.375
Implantable Lead Location: 753860
Implantable Pulse Generator Implant Date: 20120621
Lead Channel Impedance Value: 412.5 Ohm
Lead Channel Pacing Threshold Amplitude: 1.25 V
Lead Channel Pacing Threshold Amplitude: 1.25 V
Lead Channel Pacing Threshold Pulse Width: 0.5 ms
Lead Channel Setting Sensing Sensitivity: 0.5 mV
MDC IDC LEAD IMPLANT DT: 20120621
MDC IDC MSMT LEADCHNL RV PACING THRESHOLD PULSEWIDTH: 0.5 ms
MDC IDC MSMT LEADCHNL RV SENSING INTR AMPL: 12 mV
MDC IDC SET LEADCHNL RV PACING AMPLITUDE: 2.5 V
MDC IDC SET LEADCHNL RV PACING PULSEWIDTH: 0.5 ms
MDC IDC STAT BRADY RV PERCENT PACED: 0 %
Pulse Gen Serial Number: 819170

## 2017-02-20 ENCOUNTER — Ambulatory Visit (INDEPENDENT_AMBULATORY_CARE_PROVIDER_SITE_OTHER): Payer: Medicare Other

## 2017-02-20 DIAGNOSIS — Z23 Encounter for immunization: Secondary | ICD-10-CM

## 2017-04-10 LAB — COMPREHENSIVE METABOLIC PANEL
ALBUMIN: 4.2 g/dL (ref 3.6–4.8)
ALK PHOS: 89 IU/L (ref 39–117)
ALT: 17 IU/L (ref 0–44)
AST: 23 IU/L (ref 0–40)
Albumin/Globulin Ratio: 1.4 (ref 1.2–2.2)
BILIRUBIN TOTAL: 0.6 mg/dL (ref 0.0–1.2)
BUN / CREAT RATIO: 18 (ref 10–24)
BUN: 17 mg/dL (ref 8–27)
CHLORIDE: 98 mmol/L (ref 96–106)
CO2: 26 mmol/L (ref 20–29)
Calcium: 9.4 mg/dL (ref 8.6–10.2)
Creatinine, Ser: 0.97 mg/dL (ref 0.76–1.27)
GFR calc Af Amer: 95 mL/min/{1.73_m2} (ref >59)
GFR calc non Af Amer: 82 mL/min/{1.73_m2} (ref >59)
GLUCOSE: 109 mg/dL — AB (ref 65–99)
Globulin, Total: 2.9 g/dL (ref 1.5–4.5)
POTASSIUM: 4.8 mmol/L (ref 3.5–5.2)
SODIUM: 138 mmol/L (ref 134–144)
Total Protein: 7.1 g/dL (ref 6.0–8.5)

## 2017-04-10 LAB — LIPID PANEL
Chol/HDL Ratio: 3.1 ratio (ref 0.0–5.0)
Cholesterol, Total: 104 mg/dL (ref 100–199)
HDL: 34 mg/dL — AB (ref >39)
LDL Calculated: 46 mg/dL (ref 0–99)
TRIGLYCERIDES: 120 mg/dL (ref 0–149)
VLDL CHOLESTEROL CAL: 24 mg/dL (ref 5–40)

## 2017-04-10 LAB — CBC WITH DIFFERENTIAL/PLATELET
BASOS: 0 %
Basophils Absolute: 0 10*3/uL (ref 0.0–0.2)
EOS (ABSOLUTE): 0.2 10*3/uL (ref 0.0–0.4)
Eos: 3 %
HEMATOCRIT: 46.8 % (ref 37.5–51.0)
Hemoglobin: 15.4 g/dL (ref 13.0–17.7)
IMMATURE GRANS (ABS): 0 10*3/uL (ref 0.0–0.1)
IMMATURE GRANULOCYTES: 0 %
LYMPHS: 27 %
Lymphocytes Absolute: 1.9 10*3/uL (ref 0.7–3.1)
MCH: 28.8 pg (ref 26.6–33.0)
MCHC: 32.9 g/dL (ref 31.5–35.7)
MCV: 88 fL (ref 79–97)
Monocytes Absolute: 0.7 10*3/uL (ref 0.1–0.9)
Monocytes: 9 %
NEUTROS PCT: 61 %
Neutrophils Absolute: 4.3 10*3/uL (ref 1.4–7.0)
Platelets: 199 10*3/uL (ref 150–379)
RBC: 5.34 x10E6/uL (ref 4.14–5.80)
RDW: 14 % (ref 12.3–15.4)
WBC: 7.1 10*3/uL (ref 3.4–10.8)

## 2017-04-15 NOTE — Progress Notes (Signed)
Cardiology Office Note   Date:  04/17/2017   ID:  Spencer Horn, DOB [birth year], MRN [id number]  PCP:  Spencer Horn, Spencer A, MD  Cardiologist:   Spencer Siiffany McConnellsburg, MD  Electrophysiologist: Spencer BuntingGregg Taylor, MD  No chief complaint on file.    History of Present Illness: Spencer Horn is Horn 66 y.o. male with CAD s/p MI, hypertension, chronic systolic and diastolic heart failure,erectile dysfunction, and hyperlipidemia who presents for follow up.  Spencer Horn was previously Horn patient of Dr. Patty Horn.  He had an MI in 2004 and 06/2010.  The second MI was treated with DES to the LAD.  He then had LVEF 35% and subsequently underwent implantation of an ICD.  At his last appointment Spencer Horn reported fatigue.  Blood counts, thyroid function, vitamin D and testosterone levels were all within normal limits.  Since his last appointment Spencer Horn states that he has been feeling well.  However, his wife notices that he has been getting more tired over the last year.  Before the end of the day his breathing worsens and he gets tired.  He also gets short of breath with exertion.  He does not get much exercise.  The most that he does is walking around the house, though his wife reports that they are both "active."  Yesterday when caring the Christmas tree out and wrapping Horn few gas he got very short of breath.  He was then tired for the rest of the day.  He has no chest pain and has not noted any lower extremity edema, orthopnea, or PND.  His wife notes that he snores loudly but he has been persistently unwilling to get Horn sleep study.  He reports that he would not wear CPAP and therefore does not want to waste money or time on Horn test.  Both he and his wife note that they have had word finding difficulties lately.  They also sometimes walking to Horn room and forget why they are there.  Past Medical History:  Diagnosis Date  . Coronary artery disease   . Dyslipidemia   . Erectile dysfunction 08/18/2016  . History of acute  anterior wall MI   . Hyperlipidemia   . ICD (implantable cardiac defibrillator) in place June 2012  . Ischemic cardiomyopathy   . Personal history of MRSA (methicillin resistant Staphylococcus aureus)   . Tobacco abuse   . Ventricular dysfunction 07/07/2010   Severe left ventricular dysfunction with ejection fraction of 30-35%    Past Surgical History:  Procedure Laterality Date  . CARDIAC CATHETERIZATION  07/07/2010   DES to proximal LAD  . CARDIAC CATHETERIZATION  2004  . EP IMPLANTABLE DEVICE  June 2012     Current Outpatient Medications  Medication Sig Dispense Refill  . aspirin 81 MG tablet Take 81 mg by mouth daily.      [name]. atorvastatin (LIPITOR) 40 MG tablet Take 1 tablet (40 mg total) by mouth daily. 30 tablet 11  . carvedilol (COREG) 3.125 MG tablet Take 1 tablet (3.125 mg total) by mouth 2 (two) times daily. 60 tablet 11  . Multiple Vitamin (MULTIVITAMIN) tablet Take 1 tablet by mouth daily.    . nitroGLYCERIN (NITROSTAT) 0.4 MG SL tablet Place 1 tablet (0.4 mg total) under the tongue every 5 (five) minutes as needed for chest pain (for chest pain). 25 tablet 6  . prasugrel (EFFIENT) 10 MG TABS tablet Take 1 tablet (10 mg total) by mouth daily. 30 tablet 11  .  sacubitril-valsartan (ENTRESTO) 24-26 MG Take 1 tablet by mouth 2 (two) times daily. 60 tablet 5   No current facility-administered medications for this visit.     Allergies:   Patient has no known allergies.    Social History:  The patient  reports that he has quit smoking. His smoking use included cigarettes. he has never used smokeless tobacco. He reports that he does not drink alcohol or use drugs.   Family History:  The patient's family history includes Bladder Cancer in his father; Bone cancer in his mother; Diabetes in his father; Hyperlipidemia in his father; Hypertension in his mother.    ROS:  Please see the history of present illness.   Otherwise, review of systems are positive for fatigue,  diaphoresis.   All other systems are reviewed and negative.    PHYSICAL EXAM: VS:  BP 115/74   Pulse 74   Ht 5' 5.5" (1.664 m)   Wt 211 lb (95.7 kg)   BMI 34.58 kg/m  , BMI Body mass index is 34.58 kg/m. GENERAL:  Well appearing HEENT: Pupils equal round and reactive, fundi not visualized, oral mucosa unremarkable NECK:  No jugular venous distention, waveform within normal limits, carotid upstroke brisk and symmetric, no bruits, no thyromegaly LUNGS:  Clear to auscultation bilaterally HEART:  RRR.  PMI not displaced or sustained,S1 and S2 within normal limits, no S3, no S4, no clicks, no rubs, no murmurs ABD:  Flat, positive bowel sounds normal in frequency in pitch, no bruits, no rebound, no guarding, no midline pulsatile mass, no hepatomegaly, no splenomegaly EXT:  2 plus pulses throughout, no edema, no cyanosis no clubbing SKIN:  No rashes no nodules NEURO:  Cranial nerves II through XII grossly intact, motor grossly intact throughout PSYCH:  Cognitively intact, oriented to person place and time   EKG:  EKG is ordered today. The ekg ordered 10/12/15 demonstrates sinus rhythm rate 68 bpm.  Low voltage limb leads and precordial leads.  Prior anteroseptal infarct.   08/16/16: sinus rhythm.  Rate 76 bpm.  Prior anteroseptal infarct.  Lateral TWI.  04/17/17: Sinus rhythm.  Rate 74 bpm.  Prior septal infarct.    Echo 06/18/15: Study Conclusions  - Left ventricle: LVEF is severely depressed at approximately 35 to  40% with severe hypokinesis of the mid/distal lateral, mid/distal  septal, distal inferior, apical wals. The cavity size was normal. - Ventricular septum: The contour showed systolic flattening.   Recent Labs: 08/16/2016: TSH 1.16 04/10/2017: ALT 17; BUN 17; Creatinine, Ser 0.97; Hemoglobin 15.4; Platelets 199; Potassium 4.8; Sodium 138    Lipid Panel    Component Value Date/Time   CHOL 104 04/10/2017 0952   TRIG 120 04/10/2017 0952   HDL 34 (L) 04/10/2017 0952    CHOLHDL 3.1 04/10/2017 0952   CHOLHDL 3.3 04/11/2016 1035   VLDL 24 04/11/2016 1035   LDLCALC 46 04/10/2017 0952       Wt Readings from Last 3 Encounters:  04/17/17 211 lb (95.7 kg)  01/30/17 209 lb 3.2 oz (94.9 kg)  12/18/16 208 lb 9.6 oz (94.6 kg)      ASSESSMENT AND PLAN:  # Chronic systolic and diastolic heart failure:   # Shortness of breath: Spencer Horn notes worsening shortness of breath.  I'm not sure if this is from heart failure or deconditioning.  He isn't very physically active.  He is euvolemic on exam. We will try Entresto and see if this helps.  Stop losartan and continue carvedilol.  He never  filled the prescription for spironolactone.  He also has Horn smoking history so we will get PFTs.  I suspect that he also has sleep apnea that is making it more difficult to control his blood pressure.  However he refuses to consider wearing Horn CPAP machine.  # CAD s/p MI: Stable.  Mr. Battiste continues to do well. He denies angina. He has exertional dyspnea that is stable.  Continue aspirin, prasugrel, carvedilol and atorvastatin.  OK to stop prasugrel, but he prefers to continue.    # Hypertension: BP well-controlled.  Continue carvedilol and switch losartan to Entresto as above.    # Hyperlipidemia: Continue atorvastatin.  LDL 46 03/2017.      Current medicines are reviewed at length with the patient today.  The patient does not have concerns regarding medicines.  The following changes have been made:  Switch losartan to [facility]  Labs/ tests ordered today include:   Orders Placed This Encounter  Procedures  . EKG 12-Lead  . Pulmonary function test     Disposition:   FU with Khrystyne Arpin C. Duke Salvia, MD, [facility] in 1 month    This note was written with the assistance of speech recognition software.  Please excuse any transcriptional errors.  Signed, Angelus Hoopes C. Duke Salvia, MD, [facility]  04/17/2017 1:25 PM    [facility]

## 2017-04-17 ENCOUNTER — Encounter: Payer: Self-pay | Admitting: Cardiovascular Disease

## 2017-04-17 ENCOUNTER — Ambulatory Visit: Payer: Medicare Other | Admitting: Cardiovascular Disease

## 2017-04-17 VITALS — BP 115/74 | HR 74 | Ht 65.5 in | Wt 211.0 lb

## 2017-04-17 DIAGNOSIS — Z955 Presence of coronary angioplasty implant and graft: Secondary | ICD-10-CM | POA: Diagnosis not present

## 2017-04-17 DIAGNOSIS — E7800 Pure hypercholesterolemia, unspecified: Secondary | ICD-10-CM | POA: Diagnosis not present

## 2017-04-17 DIAGNOSIS — R0602 Shortness of breath: Secondary | ICD-10-CM | POA: Diagnosis not present

## 2017-04-17 DIAGNOSIS — I5042 Chronic combined systolic (congestive) and diastolic (congestive) heart failure: Secondary | ICD-10-CM

## 2017-04-17 DIAGNOSIS — I5022 Chronic systolic (congestive) heart failure: Secondary | ICD-10-CM | POA: Diagnosis not present

## 2017-04-17 MED ORDER — SACUBITRIL-VALSARTAN 24-26 MG PO TABS: 1.0000 | ORAL_TABLET | Freq: Two times a day (BID) | ORAL | 5 refills | Status: DC

## 2017-04-17 NOTE — Patient Instructions (Addendum)
Medication Instructions:  START ENTRESTO 24-26 MG TWICE A DAY   STOP LOSARTAN   Labwork: NONE  Testing/Procedures: Your physician has recommended that you have a pulmonary function test. Pulmonary Function Tests are a group of tests that measure how well air moves in and out of your lungs.  Follow-Up: Your physician recommends that you schedule a follow-up appointment in: 1 MONTH OV   If you need a refill on your cardiac medications before your next appointment, please call your pharmacy.   Pulmonary Function Tests Pulmonary function tests (PFTs) are used to measure how well your lungs work, find out what is causing your lung problems, and figure out the best treatment for you. You may have PFTs:  When you have an illness involving the lungs.  To follow changes in your lung function over time if you have a chronic lung disease.  If you are an IT trainerindustrial plant worker. This checks the effects of being exposed to chemicals over a long period of time.  To check lung function before having surgery or other procedures.  To check your lungs if you smoke.  To check if prescribed medicines or treatments are helping your lungs.  Your results will be compared to the expected lung function of someone with healthy lungs who is similar to you in:  Age.  Gender.  Height.  Weight.  Race or ethnicity.  This is done to show how your lungs compare to normal lung function (percent predicted). This is how your health care provider knows if your lung function is normal or not. If you have had PFTs done before, your health care provider will compare your current results with past results. This shows if your lung function is better, worse, or the same as before. Tell a health care provider about:  Any allergies you have.  All medicines you are taking, including inhaler or nebulizer medicines, vitamins, herbs, eye drops, creams, and over-the-counter medicines.  Any blood disorders you  have.  Any surgeries you have had, especially recent eye surgery, abdominal surgery, or chest surgery. These can make PFTs difficult or unsafe.  Any medical conditions you have, including chest pain or heart problems, tuberculosis, or respiratory infections such as pneumonia, a cold, or the flu.  Any fear of being in closed spaces (claustrophobia). Some of your tests may be in a closed space. What are the risks? Generally, this is a safe procedure. However, problems may occur, including:  Light-headedness due to over-breathing (hyperventilation).  An asthma attack from deep breathing.  A collapsed lung.  What happens before the procedure?  Take over-the-counter and prescription medicines only as told by your health care provider. If you take inhaler or nebulizer medicines, ask your health care provider which medicines you should take on the day of your testing. Some inhaler medicines may interfere with PFTs if they are taken shortly before the tests.  Follow your health care provider's instructions on eating and drinking restrictions. This may include avoiding eating large meals and drinking alcohol before the testing.  Do not use any products that contain nicotine or tobacco, such as cigarettes and e-cigarettes. If you need help quitting, ask your health care provider.  Wear comfortable clothing that will not interfere with breathing. What happens during the procedure?  You will be given a soft nose clip to wear. This is done so all of your breaths will go through your mouth instead of your nose.  You will be given a germ-free (sterile) mouthpiece. It will be  attached to a machine that measures your breathing (spirometer).  You will be asked to do various breathing maneuvers. The maneuvers will be done by breathing in (inhaling) and breathing out (exhaling). You may be asked to repeat the maneuvers several times before the testing is done.  It is important to follow the instructions  exactly to get accurate results. Make sure to blow as hard and as fast as you can when you are told to do so.  You may be given a medicine that makes the small air passages in your lungs larger (bronchodilator) after testing has been done. This medicine will make it easier for you to breathe.  The tests will be repeated after the bronchodilator has taken effect.  You will be monitored carefully during the procedure for faintness, dizziness, trouble breathing, or any other problems. The procedure may vary among health care providers and hospitals. What happens after the procedure?  It is up to you to get your test results. Ask your health care provider, or the department that is doing the tests, when your results will be ready. After you have received your test results, talk with your health care provider about treatment options, if necessary. Summary  Pulmonary function tests (PFTs) are used to measure how well your lungs work, find out what is causing your lung problems, and figure out the best treatment for you.  Wear comfortable clothing that will not interfere with breathing.  It is up to you to get your test results. After you have received them, talk with your health care provider about treatment options, if necessary. This information is not intended to replace advice given to you by your health care provider. Make sure you discuss any questions you have with your health care provider. Document Released: 12/30/2003 Document Revised: 03/30/2016 Document Reviewed: 03/30/2016 Elsevier Interactive Patient Education  2017 [employer]

## 2017-04-23 ENCOUNTER — Ambulatory Visit: Payer: Medicare Other | Admitting: Internal Medicine

## 2017-04-23 ENCOUNTER — Encounter: Payer: Self-pay | Admitting: Internal Medicine

## 2017-04-23 ENCOUNTER — Other Ambulatory Visit (INDEPENDENT_AMBULATORY_CARE_PROVIDER_SITE_OTHER): Payer: Medicare Other

## 2017-04-23 VITALS — BP 120/84 | HR 74 | Temp 98.0°F | Ht 65.5 in | Wt 211.0 lb

## 2017-04-23 DIAGNOSIS — R7301 Impaired fasting glucose: Secondary | ICD-10-CM

## 2017-04-23 LAB — HEMOGLOBIN A1C: HEMOGLOBIN A1C: 6.4 % (ref 4.6–6.5)

## 2017-04-23 NOTE — Patient Instructions (Signed)
We will check the labs today and call you back.   

## 2017-04-23 NOTE — Assessment & Plan Note (Signed)
Checking HgA1c, adjust as needed.  

## 2017-04-23 NOTE — Progress Notes (Signed)
   Subjective:    Patient ID: Spencer Horn, male    DOB: 11/23/1950, 66 y.o.   MRN: [id number]  HPI The patient is a 66 YO man coming in for high sugars. He was seen by cardiology and labs with high sugar and they asked him to come here for evaluation. Weight stable, diet is mediocre. He denies excessive thirst or urination. Denies numbness or weakness. Not exercising.  Review of Systems  Constitutional: Negative.   HENT: Negative.   Eyes: Negative.   Respiratory: Negative for cough, chest tightness and shortness of breath.   Cardiovascular: Negative for chest pain, palpitations and leg swelling.  Gastrointestinal: Negative for abdominal distention, abdominal pain, constipation, diarrhea, nausea and vomiting.  Musculoskeletal: Negative.   Skin: Negative.   Neurological: Negative.   Psychiatric/Behavioral: Negative.       Objective:   Physical Exam  Constitutional: He is oriented to person, place, and time. He appears well-developed and well-nourished.  HENT:  Head: Normocephalic and atraumatic.  Eyes: EOM are normal.  Neck: Normal range of motion.  Cardiovascular: Normal rate and regular rhythm.  Pulmonary/Chest: Effort normal and breath sounds normal. No respiratory distress. He has no wheezes. He has no rales.  Abdominal: Soft.  Neurological: He is alert and oriented to person, place, and time. Coordination normal.  Skin: Skin is warm and dry.   Vitals:   04/23/17 1331  BP: 120/84  Pulse: 74  Temp: 98 F (36.7 C)  TempSrc: Oral  SpO2: 98%  Weight: 211 lb (95.7 kg)  Height: 5' 5.5" (1.664 m)      Assessment & Plan:

## 2017-05-07 ENCOUNTER — Ambulatory Visit (INDEPENDENT_AMBULATORY_CARE_PROVIDER_SITE_OTHER): Payer: Medicare Other | Admitting: Internal Medicine

## 2017-05-07 DIAGNOSIS — R0602 Shortness of breath: Secondary | ICD-10-CM

## 2017-05-07 DIAGNOSIS — I5022 Chronic systolic (congestive) heart failure: Secondary | ICD-10-CM

## 2017-05-07 LAB — PULMONARY FUNCTION TEST
DL/VA % PRED: 101 %
DL/VA: 4.42 ml/min/mmHg/L
DLCO cor % pred: 71 %
DLCO cor: 19.38 ml/min/mmHg
DLCO unc % pred: 73 %
DLCO unc: 19.7 ml/min/mmHg
FEF 25-75 POST: 1.8 L/s
FEF 25-75 Pre: 1.01 L/sec
FEF2575-%Change-Post: 78 %
FEF2575-%PRED-POST: 75 %
FEF2575-%Pred-Pre: 42 %
FEV1-%CHANGE-POST: 20 %
FEV1-%Pred-Post: 56 %
FEV1-%Pred-Pre: 47 %
FEV1-Post: 1.68 L
FEV1-Pre: 1.4 L
FEV1FVC-%CHANGE-POST: 8 %
FEV1FVC-%PRED-PRE: 95 %
FEV6-%Change-Post: 10 %
FEV6-%Pred-Post: 57 %
FEV6-%Pred-Pre: 52 %
FEV6-Post: 2.15 L
FEV6-Pre: 1.95 L
FEV6FVC-%Pred-Post: 105 %
FEV6FVC-%Pred-Pre: 105 %
FVC-%CHANGE-POST: 10 %
FVC-%PRED-POST: 54 %
FVC-%PRED-PRE: 49 %
FVC-POST: 2.15 L
FVC-PRE: 1.95 L
PRE FEV1/FVC RATIO: 72 %
PRE FEV6/FVC RATIO: 100 %
Post FEV1/FVC ratio: 78 %
Post FEV6/FVC ratio: 100 %
RV % pred: 133 %
RV: 2.82 L
TLC % pred: 88 %
TLC: 5.46 L

## 2017-05-07 NOTE — Progress Notes (Signed)
PFT completed today 05/07/17  

## 2017-05-09 ENCOUNTER — Telehealth: Payer: Self-pay | Admitting: Cardiovascular Disease

## 2017-05-09 NOTE — Telephone Encounter (Signed)
Spoke with pt, he reports he has no seen a change since changing to the entresto and because of the cost of the medicine he wants to go back to the losartan. The patient reports no edema and his SOB is when he is working in the yard or his exertion is extreme. I have given the patient samples for the next 7 days, as he is completely out of medication. He has an appointment on 05-18-17 with dr Duke Salviarandolph, will forward for dr Domino's review and advise. Pt agreed with this plan.

## 2017-05-09 NOTE — Telephone Encounter (Signed)
Per the wife's call pt would like to go back to LOSARTIN 25 mg .   Due to the cost of and it did not work. better. ENTRESTO 24mg     Please call them if any questions.

## 2017-05-10 NOTE — Telephone Encounter (Signed)
Discussed with wife and they keep follow up next week to discuss further

## 2017-05-10 NOTE — Telephone Encounter (Signed)
Returning your call. °

## 2017-05-10 NOTE — Telephone Encounter (Signed)
Discussed with Dr Duke Salviaandolph and if patient has seen no improvement ok to change back. However if enough samples given to last until follow up will discuss further at visit.   Left message to call back

## 2017-05-14 DIAGNOSIS — I252 Old myocardial infarction: Secondary | ICD-10-CM | POA: Insufficient documentation

## 2017-05-14 DIAGNOSIS — Z8614 Personal history of Methicillin resistant Staphylococcus aureus infection: Secondary | ICD-10-CM | POA: Insufficient documentation

## 2017-05-14 DIAGNOSIS — Z720 Tobacco use: Secondary | ICD-10-CM | POA: Insufficient documentation

## 2017-05-16 NOTE — Progress Notes (Signed)
Cardiology Office Note   Date:  05/18/2017   ID:  Spencer AlbeeClaude I Horn, DOB 27-Jun-1950, MRN [id number]  PCP:  [name], [name], MD  Cardiologist:   Chilton Siiffany Clyde, MD  Electrophysiologist: [name], MD  No chief complaint on file.    History of Present Illness: Spencer Horn is a 66 y.o. male with CAD s/p MI, hypertension, chronic systolic and diastolic heart failure, erectile dysfunction, and hyperlipidemia who presents for follow up.  [name] was previously a patient of [name].  He had an MI in 2004 and 06/2010.  The second MI was treated with DES to the LAD.  He then had LVEF 35% and subsequently underwent implantation of an ICD.  He continues to complain of fatigue.  Blood counts, thyroid function, vitamin D and testosterone levels were all within normal limits.  At his last appointment he reported exertional dyspnea.  His wife notes that he snores loudly but he has been persistently unwilling to get a sleep study.  He reports that he would not wear CPAP and therefore does not want to waste money or time on a test.  He was referred for PFTs that showed severe COPD and emphysema.  Since his last appointment [name] continues to have exertional dyspnea.  He has no chest pain or pressure.  He continues to limit his exertion due to shortness of breath.  He denies orthopnea or PND.  He was started on Entresto but did not note any improvement in symptoms with this medication change.  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  His father has 16 siblings who grew up on his farm.  They were all ultimately diagnosed with emphysema that was thought to be environmental related.  He also smoked for 40 years.   Past Medical History:  Diagnosis Date  . Coronary artery disease   . Dyslipidemia   . Erectile dysfunction 08/18/2016  . History of acute anterior wall MI   . Hyperlipidemia   . ICD (implantable cardiac defibrillator) in place June 2012  . Ischemic cardiomyopathy    . Personal history of MRSA (methicillin resistant Staphylococcus aureus)   . Tobacco abuse   . Ventricular dysfunction 07/07/2010   Severe left ventricular dysfunction with ejection fraction of 30-35%    Past Surgical History:  Procedure Laterality Date  . CARDIAC CATHETERIZATION  07/07/2010   DES to proximal LAD  . CARDIAC CATHETERIZATION  2004  . EP IMPLANTABLE DEVICE  June 2012     Current Outpatient Medications  Medication Sig Dispense Refill  . aspirin 81 MG tablet Take 81 mg by mouth daily.      [name]. atorvastatin (LIPITOR) 40 MG tablet Take 1 tablet (40 mg total) by mouth daily. 30 tablet 11  . carvedilol (COREG) 3.125 MG tablet Take 1 tablet (3.125 mg total) by mouth 2 (two) times daily. 60 tablet 11  . Multiple Vitamin (MULTIVITAMIN) tablet Take 1 tablet by mouth daily.    . nitroGLYCERIN (NITROSTAT) 0.4 MG SL tablet Place 1 tablet (0.4 mg total) under the tongue every 5 (five) minutes as needed for chest pain (for chest pain). 25 tablet 6  . prasugrel (EFFIENT) 10 MG TABS tablet Take 1 tablet (10 mg total) by mouth daily. 30 tablet 11  . losartan (COZAAR) 50 MG tablet Take 1 tablet (50 mg total) by mouth daily. 90 tablet 3   No current facility-administered medications for this visit.     Allergies:  Patient has no known allergies.    Social History:  The patient  reports that he has quit smoking. His smoking use included cigarettes. he has never used smokeless tobacco. He reports that he does not drink alcohol or use drugs.   Family History:  The patient's family history includes Bladder Cancer in his father; Bone cancer in his mother; Diabetes in his father; Hyperlipidemia in his father; Hypertension in his mother.    ROS:  Please see the history of present illness.   Otherwise, review of systems are positive for fatigue, diaphoresis.   All other systems are reviewed and negative.    PHYSICAL EXAM: VS:  BP 127/85   Pulse 81   Ht 5' 5.5" (1.664 m)   Wt 209 lb 9.6  oz (95.1 kg)   SpO2 96%   BMI 34.35 kg/m  , BMI Body mass index is 34.35 kg/m. GENERAL:  Well appearing HEENT: Pupils equal round and reactive, fundi not visualized, oral mucosa unremarkable NECK:  No jugular venous distention, waveform within normal limits, carotid upstroke brisk and symmetric, no bruits, no thyromegaly LUNGS:  Clear to auscultation bilaterally HEART:  RRR.  PMI not displaced or sustained,S1 and S2 within normal limits, no S3, no S4, no clicks, no rubs, no murmurs ABD:  Flat, positive bowel sounds normal in frequency in pitch, no bruits, no rebound, no guarding, no midline pulsatile mass, no hepatomegaly, no splenomegaly EXT:  2 plus pulses throughout, no edema, no cyanosis no clubbing SKIN:  No rashes no nodules NEURO:  Cranial nerves II through XII grossly intact, motor grossly intact throughout PSYCH:  Cognitively intact, oriented to person place and time    EKG:  EKG is not ordered today. The ekg ordered 10/12/15 demonstrates sinus rhythm rate 68 bpm.  Low voltage limb leads and precordial leads.  Prior anteroseptal infarct.   08/16/16: sinus rhythm.  Rate 76 bpm.  Prior anteroseptal infarct.  Lateral TWI.  04/17/17: Sinus rhythm.  Rate 74 bpm.  Prior septal infarct.    Echo 06/18/15: Study Conclusions  - Left ventricle: LVEF is severely depressed at approximately 35 to  40% with severe hypokinesis of the mid/distal lateral, mid/distal  septal, distal inferior, apical wals. The cavity size was normal. - Ventricular septum: The contour showed systolic flattening.   Recent Labs: 08/16/2016: TSH 1.16 04/10/2017: ALT 17; BUN 17; Creatinine, Ser 0.97; Hemoglobin 15.4; Platelets 199; Potassium 4.8; Sodium 138    Lipid Panel    Component Value Date/Time   CHOL 104 04/10/2017 0952   TRIG 120 04/10/2017 0952   HDL 34 (L) 04/10/2017 0952   CHOLHDL 3.1 04/10/2017 0952   CHOLHDL 3.3 04/11/2016 1035   VLDL 24 04/11/2016 1035   LDLCALC 46 04/10/2017 0952        Wt Readings from Last 3 Encounters:  05/18/17 209 lb 9.6 oz (95.1 kg)  04/23/17 211 lb (95.7 kg)  04/17/17 211 lb (95.7 kg)      ASSESSMENT AND PLAN:  # Chronic systolic and diastolic heart failure:   [name] notes worsening shortness of breath.  He does not appear volume overloaded on exam and his symptoms did not improve on Entresto.  Therefore, we will stop this medication and resume losartan 25 mg daily.  Continue carvedilol.  # COPD:  # Shortness of breath: [name] was found to have severe COPD and emphysema on PFTs.  We will refer him to Dr. Marchelle Gearingamaswamy. I suspect that he also has sleep apnea that is making  it more difficult to control his blood pressure.  However he refuses to consider wearing a CPAP machine.  # CAD s/p MI: Stable.  No angina.  Continue aspirin, carvedilol, carvedilol, and Prasugrel.  He remains on Prasugrel due to his request.  OK to stop for procedures as needed.   # Hypertension: BP well-controlled.  Continue carvedilol and resume losartan 25mg  daily.   # Hyperlipidemia: Continue atorvastatin.  LDL 46 03/2017.     Current medicines are reviewed at length with the patient today.  The patient does not have concerns regarding medicines.  The following changes have been made:  Switch Entresto to losartan.  Labs/ tests ordered today include:   Orders Placed This Encounter  Procedures  . Ambulatory referral to Pulmonology     Disposition:   FU with Garrett Mitchum C. Duke Salvia, MD, [facility] in 6 months.    This note was written with the assistance of speech recognition software.  Please excuse any transcriptional errors.  Signed, Miyani Cronic C. Duke Salvia, MD, [facility]  05/18/2017 12:34 PM    [facility]

## 2017-05-18 ENCOUNTER — Ambulatory Visit: Payer: Medicare Other | Admitting: Cardiovascular Disease

## 2017-05-18 ENCOUNTER — Other Ambulatory Visit: Payer: Self-pay

## 2017-05-18 ENCOUNTER — Telehealth: Payer: Self-pay | Admitting: *Deleted

## 2017-05-18 ENCOUNTER — Encounter: Payer: Self-pay | Admitting: Cardiovascular Disease

## 2017-05-18 VITALS — BP 127/85 | HR 81 | Ht 65.5 in | Wt 209.6 lb

## 2017-05-18 DIAGNOSIS — E7800 Pure hypercholesterolemia, unspecified: Secondary | ICD-10-CM

## 2017-05-18 DIAGNOSIS — I5042 Chronic combined systolic (congestive) and diastolic (congestive) heart failure: Secondary | ICD-10-CM | POA: Diagnosis not present

## 2017-05-18 DIAGNOSIS — J449 Chronic obstructive pulmonary disease, unspecified: Secondary | ICD-10-CM | POA: Diagnosis not present

## 2017-05-18 DIAGNOSIS — R0602 Shortness of breath: Secondary | ICD-10-CM

## 2017-05-18 DIAGNOSIS — I110 Hypertensive heart disease with heart failure: Secondary | ICD-10-CM

## 2017-05-18 MED ORDER — LOSARTAN POTASSIUM 50 MG PO TABS: 50.0000 mg | ORAL_TABLET | Freq: Every day | ORAL | 3 refills | Status: DC

## 2017-05-18 MED ORDER — LOSARTAN POTASSIUM 25 MG PO TABS: 25.0000 mg | ORAL_TABLET | Freq: Every day | ORAL | 3 refills | Status: DC

## 2017-05-18 NOTE — Addendum Note (Signed)
Addended by: Tobin ChadMARTIN, Yacob Wilkerson V on: 05/18/2017 12:45 PM   Modules accepted: Orders

## 2017-05-18 NOTE — Telephone Encounter (Signed)
Spoke to wife to clarify  Direction for losartan . Patient is to take 25 mg LOSARTAN instead of 50 mg taht was given to patient on after visit summary.  after visit summary re done.  wife verbalized understanding.

## 2017-05-18 NOTE — Patient Instructions (Addendum)
MEDICATION CHANGES  STOP ENTRESTO  START LOSARTAN 25 MG 1 TABLET DAILY      You have been referred to PULMONOLOGIST- [facility]     Your physician wants you to follow-up in 6 MONTH [facility]. You will receive a reminder letter in the mail two months in advance. If you don't receive a letter, please call our office to schedule the follow-up appointment.    If you need a refill on your cardiac medications before your next appointment, please call your pharmacy.

## 2017-05-18 NOTE — Patient Outreach (Signed)
[facility] Care Management  05/18/2017  Spencer AlbeeClaude I Horn [birth year] [id number]    Medication Adherence call to Mr. Spencer PearsonClaude Horn patient is showing past due under [facility] [employer]on Atorvastatin 40 mg patient did not answer,I call [facility] they said patient pick up this medication on 05/17/17 for a 30 days supply patient will be due on 06/17/16.  Spencer AbedAna Horn CPhT Pharmacy Technician [facility] Care [facility] Direct Dial [phone]  Fax [phone] [email or web address]

## 2017-05-28 ENCOUNTER — Telehealth: Payer: Self-pay | Admitting: Cardiovascular Disease

## 2017-05-28 NOTE — Telephone Encounter (Signed)
Mrs. Rosalia HammersRay is returning your Call. Thanks

## 2017-05-28 NOTE — Telephone Encounter (Signed)
Spoke with wife and patient has new insurance this year. His Effient has gone from $9 a month to $50 and he will go without before paying that much. Will forward to Dr Duke Salviaandolph for recommendations

## 2017-05-29 NOTE — Telephone Encounter (Signed)
Advised wife and patient would like to just continue the ASA

## 2017-05-29 NOTE — Telephone Encounter (Signed)
Given that his stent was in 2012, the does not need this medication anymore.  Asprin 81 is sufficient.  However, if he really wants a similar medication he can use clopidogrel 75mg  daily.

## 2017-06-19 ENCOUNTER — Other Ambulatory Visit: Payer: Self-pay | Admitting: Cardiovascular Disease

## 2017-06-19 NOTE — Telephone Encounter (Signed)
Rx(s) sent to pharmacy electronically.  

## 2017-06-19 NOTE — Telephone Encounter (Signed)
Refill Request.  

## 2017-06-19 NOTE — Telephone Encounter (Signed)
Please review for refill. Thanks!  

## 2017-06-20 ENCOUNTER — Other Ambulatory Visit: Payer: Self-pay | Admitting: Cardiovascular Disease

## 2017-06-20 NOTE — Telephone Encounter (Signed)
Please review for refill. Thanks!  

## 2017-06-27 ENCOUNTER — Telehealth: Payer: Self-pay | Admitting: Internal Medicine

## 2017-06-27 ENCOUNTER — Ambulatory Visit: Payer: Medicare Other | Admitting: Internal Medicine

## 2017-06-27 ENCOUNTER — Encounter: Payer: Self-pay | Admitting: Internal Medicine

## 2017-06-27 VITALS — BP 132/84 | HR 76 | Ht 66.0 in | Wt 210.4 lb

## 2017-06-27 DIAGNOSIS — Z825 Family history of asthma and other chronic lower respiratory diseases: Secondary | ICD-10-CM | POA: Diagnosis not present

## 2017-06-27 DIAGNOSIS — Z87891 Personal history of nicotine dependence: Secondary | ICD-10-CM | POA: Diagnosis not present

## 2017-06-27 DIAGNOSIS — R0602 Shortness of breath: Secondary | ICD-10-CM

## 2017-06-27 DIAGNOSIS — J449 Chronic obstructive pulmonary disease, unspecified: Secondary | ICD-10-CM

## 2017-06-27 MED ORDER — IPRATROPIUM-ALBUTEROL 0.5-2.5 (3) MG/3ML IN SOLN: 3.0000 mL | Freq: Four times a day (QID) | RESPIRATORY_TRACT | 12 refills | Status: DC

## 2017-06-27 MED ORDER — ALBUTEROL SULFATE HFA 108 (90 BASE) MCG/ACT IN AERS: 2.0000 | INHALATION_SPRAY | Freq: Four times a day (QID) | RESPIRATORY_TRACT | 6 refills | Status: DC | PRN

## 2017-06-27 MED ORDER — TIOTROPIUM BROMIDE MONOHYDRATE 18 MCG IN CAPS: 18.0000 ug | ORAL_CAPSULE | Freq: Every day | RESPIRATORY_TRACT | 6 refills | Status: DC

## 2017-06-27 NOTE — Telephone Encounter (Signed)
Patient states that pharmacist said there is no cheaper option for the inhalers. Patient does not want to do the inhalers. Offered to do PA to see if we could get anything cheaper or another alternative and patient refused. Patient is also refusing blood work does not want to have it done. Will route to MR for FYI.

## 2017-06-27 NOTE — Telephone Encounter (Signed)
Ok I respect that is his choice. HYou can offer him duoneb via medicare part B 4 times daily as cheaper option and price it. And he can see if he still wants to take it or not. Let me know    Dr. Kalman ShanMurali Lindley Hiney, M.D., [facility]C.P Pulmonary and Critical Care Medicine Staff Physician, [facility] Center Director - Interstitial Lung Disease  Program  Pulmonary Fibrosis [facility] - [facility] Center Network at [facility] [place], [place], [id number]  Pager: [phone], If no answer or between  15:00h - 7:00h: call [phone] Telephone: [phone]

## 2017-06-27 NOTE — Patient Instructions (Addendum)
ICD-10-CM   1. Stage 2 moderate COPD by GOLD classification (HCC) J44.9   2. SOB (shortness of breath) R06.02   3. Stopped smoking with greater than 40 pack year history Z87.891   4. Family history of COPD (chronic obstructive pulmonary disease) Z82.5    Moderate copd with some symptom burden  Plan  - start anticholinergic inhaler daily maintenance; will try to get the cheapest on your insurance plan  -start albuterol as needed  - genetic alpha 1 AT phenotype test  - continue daily walks - at followup discuss prevnar, and lung cancer screeening etc.,   Followup 2 months with Dr Marchelle Gearingamaswamy or APP

## 2017-06-27 NOTE — Progress Notes (Signed)
Subjective:    Patient ID: Spencer Horn, male    DOB: November 23, 1950, 67 y.o.   MRN: [id number]  PCP Myrlene Broker, MD   HPI  IOV 06/27/2017  Chief Complaint  Patient presents with  . Pulmonary Consult    Referred by Dr Quentin Mulling pulmonary test    40ppd smoker; quit 7 years ago with chronic systolic chf ef 40% in 6935  67 year old male accompanied by his wife.  He is somewhat of a stoic historian.  History is gained from him, talking to his wife and review of the referral notes from Dr. Chilton Si.  He has chronic systolic heart failure secondary to ischemic heart disease.  He has defibrillator in place.  He grew up in a farm.  He has strong family history of COPD.  He has 40 pack smoking history quit 7 years ago.  He has insidious onset of chronic stable dyspnea for the last several months to a few years.  Yet he tells me that he gets short of breath when changing clothes but he can walk 30 minutes without stopping.  He thinks he can climb 2 flights of stairs without stopping.  There is occasional mild cough.  COPD CAT score shows that indeed that his symptom perception is mild and COPD CAT score is 8.  He did have pulmonary function test with Dr. Duke Salvia.  This shows mixed obstructive restrictive lung disease with normal total lung capacity and only an extremely mild reduction in diffusion.  He has associated obesity.  There is concerned that he has COPD and is being referred here.  CAT COPD Symptom & Quality of Life Score (GSK trademark) 0 is no burden. 5 is highest burden 06/27/2017   Never Cough -> Cough all the time 2  No phlegm in chest -> Chest is full of phlegm 0  No chest tightness -> Chest feels very tight 0  No dyspnea for 1 flight stairs/hill -> Very dyspneic for 1 flight of stairs 3  No limitations for ADL at home -> Very limited with ADL at home 0  Confident leaving home -> Not at all confident leaving home 0  Sleep soundly -> Do not sleep soundly  because of lung condition 0  Lots of Energy -> No energy at all 3  TOTAL Score (max 40)  8    Echo Jan 2017 - chronic systolic CHF - ef 35% to 40%   CXR 06/27/16 - personally visualized - > clear per report but I ? Mild intersteitial marking  PFT dec 2018 - fvc 1.85L/49%, ratio 72, TLC 5.%/88%, DLCO 19.38/71%, Fev1 post BD +20%   Walking desaturation test on 06/27/2017 185 feet x 3 laps on ROOM AIR:  did not desaturate. Rest pulse ox was 100%, final pulse ox was 99%. HR response 75/min at rest to 97/min at peak exertion. Patient Spencer Horn  Did not Desaturate < 88% . Spencer Horn did not  Desaturated </= 3% points. Spencer Horn yes did get tachyardic    has a past medical history of Coronary artery disease, Dyslipidemia, Erectile dysfunction (08/18/2016), History of acute anterior wall MI, Hyperlipidemia, ICD (implantable cardiac defibrillator) in place (June 2012), Ischemic cardiomyopathy, Personal history of MRSA (methicillin resistant Staphylococcus aureus), Tobacco abuse, and Ventricular dysfunction (07/07/2010).   reports that he quit smoking about 7 years ago. His smoking use included cigarettes. He started smoking about 47 years ago. He has a 40.00 pack-year smoking history. he has  never used smokeless tobacco.  Past Surgical History:  Procedure Laterality Date  . CARDIAC CATHETERIZATION  07/07/2010   DES to proximal LAD  . CARDIAC CATHETERIZATION  2004  . EP IMPLANTABLE DEVICE  June 2012    No Known Allergies  Immunization History  Administered Date(s) Administered  . Influenza, High Dose Seasonal PF 02/20/2017  . Influenza,inj,Quad PF,6+ Mos 01/20/2014, 02/05/2015, 02/15/2016  . Pneumococcal Polysaccharide-23 05/22/2004  . Tdap 05/22/2005, 02/15/2016  . Zoster 10/03/2013    Family History  Problem Relation Age of Onset  . Hypertension Mother   . Bone cancer Mother   . Hyperlipidemia Father   . Bladder Cancer Father   . Diabetes Father      Current Outpatient  Medications:  .  aspirin 81 MG tablet, Take 81 mg by mouth daily.  , Disp: , Rfl:  .  atorvastatin (LIPITOR) 40 MG tablet, Take 1 tablet (40 mg total) by mouth daily., Disp: 30 tablet, Rfl: 11 .  carvedilol (COREG) 3.125 MG tablet, Take 1 tablet (3.125 mg total) by mouth 2 (two) times daily., Disp: 60 tablet, Rfl: 11 .  losartan (COZAAR) 25 MG tablet, TAKE ONE TABLET BY MOUTH ONCE DAILY, Disp: 90 tablet, Rfl: 3 .  Multiple Vitamin (MULTIVITAMIN) tablet, Take 1 tablet by mouth daily., Disp: , Rfl:  .  nitroGLYCERIN (NITROSTAT) 0.4 MG SL tablet, Place 1 tablet (0.4 mg total) under the tongue every 5 (five) minutes as needed for chest pain (for chest pain)., Disp: 25 tablet, Rfl: 6 .  spironolactone (ALDACTONE) 25 MG tablet, TAKE ONE-HALF TABLET BY MOUTH ONCE DAILY, Disp: 45 tablet, Rfl: 3   Review of Systems  Constitutional: Negative for fever and unexpected weight change.  HENT: Negative for congestion, dental problem, ear pain, nosebleeds, postnasal drip, rhinorrhea, sinus pressure, sneezing, sore throat and trouble swallowing.   Eyes: Negative for redness and itching.  Respiratory: Positive for cough, chest tightness and shortness of breath. Negative for wheezing.   Cardiovascular: Negative for palpitations and leg swelling.  Gastrointestinal: Negative for nausea and vomiting.  Genitourinary: Negative for dysuria.  Musculoskeletal: Negative for joint swelling.  Skin: Negative for rash.  Allergic/Immunologic: Negative.  Negative for environmental allergies, food allergies and immunocompromised state.  Neurological: Negative for headaches.  Hematological: Bruises/bleeds easily.  Psychiatric/Behavioral: Negative for dysphoric mood. The patient is not nervous/anxious.        Objective:   Physical Exam  Constitutional: He is oriented to person, place, and time. He appears well-developed and well-nourished. No distress.  HENT:  Head: Normocephalic and atraumatic.  Right Ear: External ear  normal.  Left Ear: External ear normal.  Mouth/Throat: Oropharynx is clear and moist. No oropharyngeal exudate.  Mallampati class III.  He has refused sleep apnea workup  Eyes: Conjunctivae and EOM are normal. Pupils are equal, round, and reactive to light. Right eye exhibits no discharge. Left eye exhibits no discharge. No scleral icterus.  Neck: Normal range of motion. Neck supple. No JVD present. No tracheal deviation present. No thyromegaly present.  Cardiovascular: Normal rate, regular rhythm and intact distal pulses. Exam reveals no gallop and no friction rub.  No murmur heard. Pulmonary/Chest: Effort normal and breath sounds normal. No respiratory distress. He has no wheezes. He has no rales. He exhibits no tenderness.  No definite crackles  Abdominal: Soft. Bowel sounds are normal. He exhibits no distension and no mass. There is no tenderness. There is no rebound and no guarding.  Significant visceral obesity  Musculoskeletal: Normal range of motion.  He exhibits no edema or tenderness.  No clubbing  Lymphadenopathy:    He has no cervical adenopathy.  Neurological: He is alert and oriented to person, place, and time. He has normal reflexes. No cranial nerve deficit. Coordination normal.  Skin: Skin is warm and dry. No rash noted. He is not diaphoretic. No erythema. No pallor.  Psychiatric: He has a normal mood and affect. His behavior is normal. Judgment and thought content normal.  Nursing note and vitals reviewed.   Vitals:   06/27/17 1030  BP: 132/84  Pulse: 76  SpO2: 95%  Weight: 210 lb 6.4 oz (95.4 kg)  Height: 5\' 6"  (1.676 m)    Estimated body mass index is 33.96 kg/m as calculated from the following:   Height as of this encounter: 5\' 6"  (1.676 m).   Weight as of this encounter: 210 lb 6.4 oz (95.4 kg).       Assessment & Plan:     ICD-10-CM   1. Stage 2 moderate COPD by GOLD classification (HCC) J44.9   2. SOB (shortness of breath) R06.02   3. Stopped smoking  with greater than 40 pack year history Z87.891   4. Family history of COPD (chronic obstructive pulmonary disease) Z82.5       Moderate copd with some symptom burden Low pretest probability for interstitial lung disease  Plan  - start anticholinergic inhaler daily maintenance; will try to get the cheapest on your insurance plan  -start albuterol as needed  - genetic alpha 1 AT phenotype test  - continue daily walks - at followup discuss prevnar, and lung cancer screeening etc.,   Followup 2 months with Dr Marchelle Gearingamaswamy or APP   Dr. Kalman ShanMurali Daly Whipkey, M.D., [facility].C.P Pulmonary and Critical Care Medicine Staff Physician, [facility] Center Director - Interstitial Lung Disease  Program  Pulmonary Fibrosis [facility] - Care Center Network at [facility] [place], [place], [id number]  Pager: [phone], If no answer or between  15:00h - 7:00h: call [phone] Telephone: [phone]

## 2017-06-27 NOTE — Telephone Encounter (Signed)
Called and spoke with pt's wife letting her know the med MR stated we could switch pt to to see if it would be cheaper for them.  Selina expressed understanding. Verified pt's pharmacy and med sent in.  Nothing further needed at this current time.

## 2017-07-06 ENCOUNTER — Other Ambulatory Visit: Payer: Self-pay | Admitting: Cardiovascular Disease

## 2017-07-06 NOTE — Telephone Encounter (Signed)
REFILL 

## 2017-07-06 NOTE — Telephone Encounter (Signed)
Please review for refill. Thanks!  

## 2017-08-17 ENCOUNTER — Ambulatory Visit: Payer: Medicare Other | Admitting: Internal Medicine

## 2017-08-17 ENCOUNTER — Ambulatory Visit (INDEPENDENT_AMBULATORY_CARE_PROVIDER_SITE_OTHER): Payer: Medicare Other

## 2017-08-17 DIAGNOSIS — Z23 Encounter for immunization: Secondary | ICD-10-CM

## 2017-08-20 ENCOUNTER — Other Ambulatory Visit: Payer: Self-pay | Admitting: Cardiovascular Disease

## 2017-08-20 NOTE — Telephone Encounter (Signed)
Please review for refill. Thanks!  

## 2017-08-20 NOTE — Telephone Encounter (Signed)
REFILL 

## 2017-09-24 ENCOUNTER — Ambulatory Visit: Payer: Medicare Other | Admitting: Adult Health

## 2017-10-22 ENCOUNTER — Telehealth: Payer: Self-pay | Admitting: Emergency Medicine

## 2017-10-22 NOTE — Telephone Encounter (Signed)
Called patient to schedule AWV. Patient will call back at later date to schedule. 

## 2017-11-16 ENCOUNTER — Encounter: Payer: Self-pay | Admitting: Cardiovascular Disease

## 2017-11-16 ENCOUNTER — Ambulatory Visit (INDEPENDENT_AMBULATORY_CARE_PROVIDER_SITE_OTHER): Payer: Medicare Other | Admitting: Cardiovascular Disease

## 2017-11-16 VITALS — BP 110/80 | HR 66 | Ht 66.0 in | Wt 207.4 lb

## 2017-11-16 DIAGNOSIS — E7800 Pure hypercholesterolemia, unspecified: Secondary | ICD-10-CM

## 2017-11-16 DIAGNOSIS — I5042 Chronic combined systolic (congestive) and diastolic (congestive) heart failure: Secondary | ICD-10-CM | POA: Diagnosis not present

## 2017-11-16 DIAGNOSIS — I110 Hypertensive heart disease with heart failure: Secondary | ICD-10-CM

## 2017-11-16 DIAGNOSIS — R0602 Shortness of breath: Secondary | ICD-10-CM

## 2017-11-16 NOTE — Progress Notes (Signed)
Cardiology Office Note   Date:  11/16/2017   ID:  Spencer Horn, DOB 02-Feb-1951, MRN [id number]  PCP:  Spencer Broker, MD  Cardiologist:   Spencer Si, MD  Electrophysiologist: Spencer Bunting, MD  Chief Complaint  Patient presents with  . Follow-up     History of Present Illness: Spencer Horn is a 67 y.o. male with CAD s/p MI, hypertension, chronic systolic and diastolic heart failure, erectile dysfunction, COPD, and hyperlipidemia who presents for follow up.  Spencer Horn was previously a patient of Spencer Horn.  He had an MI in 2004 and 06/2010.  The second MI was treated with DES to the LAD.  He then had LVEF 35% and subsequently underwent implantation of an ICD.  He continues to complain of fatigue.  Blood counts, thyroid function, vitamin D and testosterone levels were all within normal limits.  Spencer Horn has struggled with exertional dyspnea.  His wife notes that he snores loudly but he has been persistently unwilling to get a sleep study.  He reports that he would not wear CPAP and therefore does not want to waste money or time on a test.  He was referred for PFTs that showed severe COPD and emphysema.  At his last appointment Entresto was switched back to losartan due to cost and the fact that his symptoms hadn't improved.  At his last appointment he was referred to Spencer Horn and was started on Spiriva.  The Spiriva was not helpful so he stopped using it.  He does use the albuterol rescue inhaler as needed.  He is only use this a couple times.  Last week he was working on his porch and got hot and short of breath.  He used the albuterol and it was helpful.  He has not had any chest pain.  His shortness of breath is stable and unchanged.  He denies lower extremity edema, orthopnea, or PND.  He has not expands any palpitations, lightheadedness, or dizziness.  Past Medical History:  Diagnosis Date  . Coronary artery disease   . Dyslipidemia   . Erectile dysfunction 08/18/2016    . History of acute anterior wall MI   . Hyperlipidemia   . ICD (implantable cardiac defibrillator) in place June 2012  . Ischemic cardiomyopathy   . Personal history of MRSA (methicillin resistant Staphylococcus aureus)   . Tobacco abuse   . Ventricular dysfunction 07/07/2010   Severe left ventricular dysfunction with ejection fraction of 30-35%    Past Surgical History:  Procedure Laterality Date  . CARDIAC CATHETERIZATION  07/07/2010   DES to proximal LAD  . CARDIAC CATHETERIZATION  2004  . EP IMPLANTABLE DEVICE  June 2012     Current Outpatient Medications  Medication Sig Dispense Refill  . albuterol (PROVENTIL HFA;VENTOLIN HFA) 108 (90 Base) MCG/ACT inhaler Inhale 2 puffs into the lungs every 6 (six) hours as needed for wheezing or shortness of breath. 1 Inhaler 6  . aspirin 81 MG tablet Take 81 mg by mouth daily.      [name] atorvastatin (LIPITOR) 40 MG tablet TAKE ONE TABLET BY MOUTH ONCE DAILY 90 tablet 1  . carvedilol (COREG) 3.125 MG tablet TAKE ONE TABLET BY MOUTH TWICE DAILY 180 tablet 1  . losartan (COZAAR) 25 MG tablet TAKE ONE TABLET BY MOUTH ONCE DAILY 90 tablet 3  . Multiple Vitamin (MULTIVITAMIN) tablet Take 1 tablet by mouth daily.    . nitroGLYCERIN (NITROSTAT) 0.4 MG SL tablet Place 1 tablet (0.4 mg  total) under the tongue every 5 (five) minutes as needed for chest pain (for chest pain). 25 tablet 6  . spironolactone (ALDACTONE) 25 MG tablet TAKE ONE-HALF TABLET BY MOUTH ONCE DAILY 45 tablet 3   No current facility-administered medications for this visit.     Allergies:   Patient has no known allergies.    Social History:  The patient  reports that he quit smoking about 7 years ago. His smoking use included cigarettes. He started smoking about 47 years ago. He has a 40.00 pack-year smoking history. He has never used smokeless tobacco. He reports that he does not drink alcohol or use drugs.   Family History:  The patient's family history includes Bladder Cancer  in his father; Bone cancer in his mother; Diabetes in his father; Hyperlipidemia in his father; Hypertension in his mother.    ROS:  Please see the history of present illness.   Otherwise, review of systems are positive for fatigue, diaphoresis.   All other systems are reviewed and negative.    PHYSICAL EXAM: VS:  BP 110/80   Pulse 66   Ht 5\' 6"  (1.676 m)   Wt 207 lb 6.4 oz (94.1 kg)   SpO2 97%   BMI 33.48 kg/m  , BMI Body mass index is 33.48 kg/m. GENERAL:  Well appearing HEENT: Pupils equal round and reactive, fundi not visualized, oral mucosa unremarkable NECK:  No jugular venous distention, waveform within normal limits, carotid upstroke brisk and symmetric, no bruits 11/16/2017: LUNGS:  Clear to auscultation bilaterally HEART:  RRR.  PMI not displaced or sustained,S1 and S2 within normal limits, no S3, no S4, no clicks, no rubs, no murmurs ABD:  Flat, positive bowel sounds normal in frequency in pitch, no bruits, no rebound, no guarding, no midline pulsatile mass, no hepatomegaly, no splenomegaly EXT:  2 plus pulses throughout, no edema, no cyanosis no clubbing.  L ankle effusion SKIN:  No rashes no nodules NEURO:  Cranial nerves II through XII grossly intact, motor grossly intact throughout PSYCH:  Cognitively intact, oriented to person place and time   EKG:  EKG is ordered today. The ekg ordered 10/12/15 demonstrates sinus rhythm rate 68 bpm.  Low voltage limb leads and precordial leads.  Prior anteroseptal infarct.   08/16/16: sinus rhythm.  Rate 76 bpm.  Prior anteroseptal infarct.  Lateral TWI.  04/17/17: Sinus rhythm.  Rate 74 bpm.  Prior septal infarct.   Sinus rhythm.  Rate 66 bpm.  Prior septal infarct.  Anterolateral T wave inversions unchanged from prior.  Echo 06/18/15: Study Conclusions  - Left ventricle: LVEF is severely depressed at approximately 35 to  40% with severe hypokinesis of the mid/distal lateral, mid/distal  septal, distal inferior, apical wals. The  cavity size was normal. - Ventricular septum: The contour showed systolic flattening.   Recent Labs: 04/10/2017: ALT 17; BUN 17; Creatinine, Ser 0.97; Hemoglobin 15.4; Platelets 199; Potassium 4.8; Sodium 138    Lipid Panel    Component Value Date/Time   CHOL 104 04/10/2017 0952   TRIG 120 04/10/2017 0952   HDL 34 (L) 04/10/2017 0952   CHOLHDL 3.1 04/10/2017 0952   CHOLHDL 3.3 04/11/2016 1035   VLDL 24 04/11/2016 1035   LDLCALC 46 04/10/2017 0952       Wt Readings from Last 3 Encounters:  11/16/17 207 lb 6.4 oz (94.1 kg)  06/27/17 210 lb 6.4 oz (95.4 kg)  05/18/17 209 lb 9.6 oz (95.1 kg)      ASSESSMENT AND PLAN:  #  Chronic systolic and diastolic heart failure:   Stable.  Symptoms did not improve on Entresto and it was more expensive so he switched back to losartan.  Continue carvedilol, losartan, and spironolactone.  ICD managed by Dr. Ladona Ridgelaylor.  # CAD s/p MI: Stable.  No angina.  Continue aspirin, carvedilol, and atorvastatin.  Check lipids and CMP prior to follow up.  LDL 46 03/2017.    # Hypertension: BP well-controlled.  Continue carvedilol and losartan 25mg  daily.     Current medicines are reviewed at length with the patient today.  The patient does not have concerns regarding medicines.  The following changes have been made:  Switch Entresto to losartan.  Labs/ tests ordered today include:   No orders of the defined types were placed in this encounter.    Disposition:   FU with Aquilla Voiles C. Duke Salviaandolph, MD, [facility] in 8 months.    Signed, Chaunte Hornbeck C. Duke Salviaandolph, MD, [facility]  11/16/2017 11:11 AM    [facility]

## 2017-11-16 NOTE — Patient Instructions (Addendum)
Medication Instructions:  Your physician recommends that you continue on your current medications as directed. Please refer to the Current Medication list given to you today.  Labwork: Please return for FASTING labs in 8 months (prior to appointment) (CMET,Lipid)-lab orders will be mailed to you closer to time.   Our in office lab hours are Monday-Friday 8:00-4:00, closed for lunch 12:45-1:45 pm.  No appointment needed.  Follow-Up: Your physician wants you to follow-up in: 8 months with Dr. Duke Salviaandolph. You will receive a reminder letter in the mail two months in advance. If you don't receive a letter, please call our office to schedule the follow-up appointment.       If you need a refill on your cardiac medications before your next appointment, please call your pharmacy.

## 2017-11-19 NOTE — Addendum Note (Signed)
Addended by: Lorain ChildesPRESSLEY, Harper Vandervoort A on: 11/19/2017 01:43 PM   Modules accepted: Orders

## 2017-12-11 NOTE — Progress Notes (Signed)
[name] [address] [place], [name] [address] Phone: [phone] Subjective:     CC: left ankle pain   [id number]  Spencer Horn is a 67 y.o. male coming in with complaint of left ankle pain. Pain is over lateral aspect of ankle. Had pain for 1 month. 1/10 pain constantly. Wife states that his ankle is swollen. Has tried elevating ankle but no other therapies.  Patient does not remember any true injury.  Patient talked to his cardiologist and did not say that it was swelling.  Seems to be continuing in the same region.  Possibly increasing slowly.      Past Medical History:  Diagnosis Date  . Coronary artery disease   . Dyslipidemia   . Erectile dysfunction 08/18/2016  . History of acute anterior wall MI   . Hyperlipidemia   . ICD (implantable cardiac defibrillator) in place June 2012  . Ischemic cardiomyopathy   . Personal history of MRSA (methicillin resistant Staphylococcus aureus)   . Tobacco abuse   . Ventricular dysfunction 07/07/2010   Severe left ventricular dysfunction with ejection fraction of 30-35%   Past Surgical History:  Procedure Laterality Date  . CARDIAC CATHETERIZATION  07/07/2010   DES to proximal LAD  . CARDIAC CATHETERIZATION  2004  . EP IMPLANTABLE DEVICE  June 2012   Social History   Socioeconomic History  . Marital status: Married    Spouse name: Not on file  . Number of children: Not on file  . Years of education: Not on file  . Highest education level: Not on file  Occupational History  . Not on file  Social Needs  . Financial resource strain: Not on file  . Food insecurity:    Worry: Not on file    Inability: Not on file  . Transportation needs:    Medical: Not on file    Non-medical: Not on file  Tobacco Use  . Smoking status: Former Smoker    Packs/day: 1.00    Years: 40.00    Pack years: 40.00    Types: Cigarettes    Start date: 1972    Last attempt to quit: 2012    Years since quitting:  7.5  . Smokeless tobacco: Never Used  Substance and Sexual Activity  . Alcohol use: No  . Drug use: No  . Sexual activity: Yes  Lifestyle  . Physical activity:    Days per week: Not on file    Minutes per session: Not on file  . Stress: Not on file  Relationships  . Social connections:    Talks on phone: Not on file    Gets together: Not on file    Attends religious service: Not on file    Active member of club or organization: Not on file    Attends meetings of clubs or organizations: Not on file    Relationship status: Not on file  Other Topics Concern  . Not on file  Social History Narrative  . Not on file   No Known Allergies Family History  Problem Relation Age of Onset  . Hypertension Mother   . Bone cancer Mother   . Hyperlipidemia Father   . Bladder Cancer Father   . Diabetes Father      Past medical history, social, surgical and family history all reviewed in electronic medical record.  No pertanent information unless stated regarding to the chief complaint.   Review of Systems:Review of systems updated and as  accurate as of 12/13/17  No headache, visual changes, nausea, vomiting, diarrhea, constipation, dizziness, abdominal pain, skin rash, fevers, chills, night sweats, weight loss, swollen lymph nodes, body aches, joint swelling, chest pain, shortness of breath, mood changes.  Positive muscle aches  Objective  Blood pressure 108/70, pulse 75, height 5\' 6"  (1.676 m), weight 207 lb (93.9 kg), SpO2 96 %. Systems examined below as of 12/13/17   General: No apparent distress alert and oriented x3 mood and affect normal, dressed appropriately.  HEENT: Pupils equal, extraocular movements intact  Respiratory: Patient's speak in full sentences and does not appear short of breath  Cardiovascular: No lower extremity edema, non tender, no erythema  Skin: Warm dry intact with no signs of infection or rash on extremities or on axial skeleton.  Abdomen: Soft nontender    Neuro: Cranial nerves II through XII are intact, neurovascularly intact in all extremities with 2+ DTRs and 2+ pulses.  Lymph: No lymphadenopathy of posterior or anterior cervical chain or axillae bilaterally.  Gait normal with good balance and coordination.  MSK:  Non tender with full range of motion and good stability and symmetric strength and tone of shoulders, elbows, wrist, hip, knee bilaterally.   Ankle: Left Swelling noted over the lateral malleolus. Range of motion is full in all directions. Strength is 5/5 in all directions. Stable lateral and medial ligaments; squeeze test and kleiger test unremarkable; Talar dome mild tenderness No pain at base of 5th MT; No tenderness over cuboid; No tenderness over N spot or navicular prominence No tenderness on posterior aspects of lateral and medial malleolus No signs of tenderness over the peroneal tendons but more side of the anterior aspect of the lateral malleolus possible mouse palpated that feels to be soft tissue with movable Negative tarsal tunnel tinel's Able to walk 4 steps.  MSK US performed of: Left ankle This study was ordered, performed, and interpreted by Terrilee FilesZach Smith D.O.  Foot/Ankle:   All structures visualized.   Talar dome possibly some mild hypoechoic changes laterally Ankle mortise with mild effusion. Peroneus longus and brevis tendons unremarkable on long and transverse views without sheath effusions. Just anterior and lateral superficial to the lateral malleolus there is a soft tissue mass with increasing vascularity that seems to be abnormal. IMPRESSION: Atypical soft tissue mass of the lateral ankle     Impression and Recommendations:     This case required medical decision making of moderate complexity.      Note: This dictation was prepared with Dragon dictation along with smaller phrase technology. Any transcriptional errors that result from this process are unintentional.

## 2017-12-13 ENCOUNTER — Encounter: Payer: Self-pay | Admitting: Family Medicine

## 2017-12-13 ENCOUNTER — Ambulatory Visit: Payer: Self-pay

## 2017-12-13 ENCOUNTER — Ambulatory Visit (INDEPENDENT_AMBULATORY_CARE_PROVIDER_SITE_OTHER): Payer: Medicare Other | Admitting: *Deleted

## 2017-12-13 ENCOUNTER — Ambulatory Visit (INDEPENDENT_AMBULATORY_CARE_PROVIDER_SITE_OTHER): Payer: Medicare Other | Admitting: Family Medicine

## 2017-12-13 ENCOUNTER — Ambulatory Visit (INDEPENDENT_AMBULATORY_CARE_PROVIDER_SITE_OTHER)
Admission: RE | Admit: 2017-12-13 | Discharge: 2017-12-13 | Disposition: A | Discharge status: Home / Self Care | Payer: Medicare Other | Source: Ambulatory Visit | Attending: Family Medicine | Admitting: Family Medicine

## 2017-12-13 VITALS — BP 108/70 | HR 75 | Ht 66.0 in | Wt 207.0 lb

## 2017-12-13 DIAGNOSIS — M25572 Pain in left ankle and joints of left foot: Secondary | ICD-10-CM

## 2017-12-13 DIAGNOSIS — R2242 Localized swelling, mass and lump, left lower limb: Secondary | ICD-10-CM | POA: Diagnosis not present

## 2017-12-13 DIAGNOSIS — I255 Ischemic cardiomyopathy: Secondary | ICD-10-CM | POA: Diagnosis not present

## 2017-12-13 DIAGNOSIS — G8929 Other chronic pain: Secondary | ICD-10-CM

## 2017-12-13 DIAGNOSIS — I5022 Chronic systolic (congestive) heart failure: Secondary | ICD-10-CM

## 2017-12-13 DIAGNOSIS — M7989 Other specified soft tissue disorders: Secondary | ICD-10-CM | POA: Diagnosis not present

## 2017-12-13 NOTE — Patient Instructions (Signed)
Good to see you  We will get xrays today  We will give it a little time Compression sleeve daily and then also try  pennsaid pinkie amount topically 2 times daily as needed.  IF it enlarges at all or you are concern I would recommend MRI.  See me again in 3-4 weeks

## 2017-12-13 NOTE — Assessment & Plan Note (Signed)
Patient has swelling of the lateral aspect of the ankle.  And on ultrasound it does have some atypical vascular changes.  Discussed icing regimen which patient will start doing as well as compression.  We discussed though that the differential of this could be even a potential for sarcoma.  Patient wants to hold on any type of advanced imaging at this time with no other systemic findings.  Patient will have x-rays done to make sure that there is no bony involvement.  Patient will follow-up with me again in 3 weeks for further evaluation but notices of any enlargement that he should call immediately.

## 2017-12-13 NOTE — Progress Notes (Signed)
Remote ICD transmission.   

## 2018-01-07 NOTE — Progress Notes (Signed)
[facility] Jab[facility] Deese D.O. [facility] [address] [place], [place] [address] Phone: [phone] Subjective:     CC: Ankle pain and swelling follow-up  VOZ:[id number]:Subjective  Spencer AlbeeClaude I Horn is a 67 y.o. male coming in with complaint of ankle pain. States his pain is the same as last visit.  Patient states no significant improvement.  Patient's ultrasound last time did show atypical vascularity of the lateral aspect of the ankle.  X-rays were taken at last exam as well that were independently visualized by me showing minimal arthritic changes but continued soft tissue swelling on the lateral aspect of the ankle.    Past Medical History:  Diagnosis Date  . Coronary artery disease   . Dyslipidemia   . Erectile dysfunction 08/18/2016  . History of acute anterior wall MI   . Hyperlipidemia   . ICD (implantable cardiac defibrillator) in place June 2012  . Ischemic cardiomyopathy   . Personal history of MRSA (methicillin resistant Staphylococcus aureus)   . Tobacco abuse   . Ventricular dysfunction 07/07/2010   Severe left ventricular dysfunction with ejection fraction of 30-35%   Past Surgical History:  Procedure Laterality Date  . CARDIAC CATHETERIZATION  07/07/2010   DES to proximal LAD  . CARDIAC CATHETERIZATION  2004  . EP IMPLANTABLE DEVICE  June 2012   Social History   Socioeconomic History  . Marital status: Married    Spouse name: Not on file  . Number of children: Not on file  . Years of education: Not on file  . Highest education level: Not on file  Occupational History  . Not on file  Social Needs  . Financial resource strain: Not on file  . Food insecurity:    Worry: Not on file    Inability: Not on file  . Transportation needs:    Medical: Not on file    Non-medical: Not on file  Tobacco Use  . Smoking status: Former Smoker    Packs/day: 1.00    Years: 40.00    Pack years: 40.00    Types: Cigarettes    Start date: 1972    Last attempt to quit: 2012   Years since quitting: 7.6  . Smokeless tobacco: Never Used  Substance and Sexual Activity  . Alcohol use: No  . Drug use: No  . Sexual activity: Yes  Lifestyle  . Physical activity:    Days per week: Not on file    Minutes per session: Not on file  . Stress: Not on file  Relationships  . Social connections:    Talks on phone: Not on file    Gets together: Not on file    Attends religious service: Not on file    Active member of club or organization: Not on file    Attends meetings of clubs or organizations: Not on file    Relationship status: Not on file  Other Topics Concern  . Not on file  Social History Narrative  . Not on file   No Known Allergies Family History  Problem Relation Age of Onset  . Hypertension Mother   . Bone cancer Mother   . Hyperlipidemia Father   . Bladder Cancer Father   . Diabetes Father      Past medical history, social, surgical and family history all reviewed in electronic medical record.  No pertanent information unless stated regarding to the chief complaint.   Review of Systems:Review of systems updated and as accurate as of 01/08/18  No headache,  visual changes, nausea, vomiting, diarrhea, constipation, dizziness, abdominal pain, skin rash, fevers, chills, night sweats, weight loss, swollen lymph nodes, body aches, chest pain, shortness of breath, mood changes.  Positive muscle aches and joint swelling. Positive muscle aches and joint swelling  Objective  Blood pressure 126/90, pulse 70, height 5\' 6"  (1.676 m), weight 202 lb (91.6 kg), SpO2 96 %. Systems examined below as of 01/08/18   General: No apparent distress alert and oriented x3 mood and affect normal, dressed appropriately.  HEENT: Pupils equal, extraocular movements intact  Respiratory: Patient's speak in full sentences and does not appear short of breath  Cardiovascular: No lower extremity edema, non tender, no erythema  Skin: Warm dry intact with no signs of infection or rash  on extremities or on axial skeleton.  Abdomen: Soft nontender  Neuro: Cranial nerves II through XII are intact, neurovascularly intact in all extremities with 2+ DTRs and 2+ pulses.  Lymph: No lymphadenopathy of posterior or anterior cervical chain or axillae bilaterally.  Gait antalgic gait  MSK:  tender with full range of motion and good stability and symmetric strength and tone of shoulders, elbows, wrist, hip, knee bilaterally.  Ankle exam shows the patient may have more increased swelling over the lateral aspect of the ankle.  Mild tenderness to palpation but not severe.  Mild limited range of motion in all planes of the ankle.  Neurovascularly intact distally.  Limited musculoskeletal ultrasound was performed and interpreted by Judi SaaZachary M Kalayla Shadden Left lateral ankle shows and then soft tissue mass is still noted.  Still abnormal vascularity in the surrounding vicinity as well as what seems to be internal to the mass.  Possible small amount going into the joint itself.      Impression and Recommendations:     This case required medical decision making of moderate complexity.      Note: This dictation was prepared with Dragon dictation along with smaller phrase technology. Any transcriptional errors that result from this process are unintentional.

## 2018-01-08 ENCOUNTER — Ambulatory Visit (INDEPENDENT_AMBULATORY_CARE_PROVIDER_SITE_OTHER): Payer: Medicare Other | Admitting: Family Medicine

## 2018-01-08 ENCOUNTER — Ambulatory Visit: Payer: Self-pay

## 2018-01-08 ENCOUNTER — Encounter: Payer: Self-pay | Admitting: Family Medicine

## 2018-01-08 ENCOUNTER — Other Ambulatory Visit: Payer: Self-pay | Admitting: Cardiovascular Disease

## 2018-01-08 VITALS — BP 126/90 | HR 70 | Ht 66.0 in | Wt 202.0 lb

## 2018-01-08 DIAGNOSIS — M25572 Pain in left ankle and joints of left foot: Principal | ICD-10-CM

## 2018-01-08 DIAGNOSIS — R2242 Localized swelling, mass and lump, left lower limb: Secondary | ICD-10-CM

## 2018-01-08 DIAGNOSIS — G8929 Other chronic pain: Secondary | ICD-10-CM

## 2018-01-08 DIAGNOSIS — I255 Ischemic cardiomyopathy: Secondary | ICD-10-CM | POA: Diagnosis not present

## 2018-01-08 NOTE — Assessment & Plan Note (Signed)
No improvement in possible increase in size.  Patient has some mild centimeters gotten bigger with abnormal blood flow at this moment.  Encourage patient to have MRI for further evaluation and treatment.  Unable to exclude sarcoma at this time.  Patient is in agreement with the plan and will follow-up after advanced imaging.

## 2018-01-08 NOTE — Patient Instructions (Signed)
Good to see you  Spencer Horn is your friend.  Lets get MRi of the ankle Call [phone] and lets see what it is  I will write or call you after and discuss next step

## 2018-01-09 ENCOUNTER — Other Ambulatory Visit: Payer: Self-pay | Admitting: *Deleted

## 2018-01-09 DIAGNOSIS — R2242 Localized swelling, mass and lump, left lower limb: Secondary | ICD-10-CM

## 2018-01-15 ENCOUNTER — Ambulatory Visit
Admission: RE | Admit: 2018-01-15 | Discharge: 2018-01-15 | Disposition: A | Discharge status: Home / Self Care | Payer: Medicare Other | Source: Ambulatory Visit | Attending: Family Medicine | Admitting: Family Medicine

## 2018-01-15 DIAGNOSIS — M7989 Other specified soft tissue disorders: Secondary | ICD-10-CM | POA: Diagnosis not present

## 2018-01-15 DIAGNOSIS — M25572 Pain in left ankle and joints of left foot: Secondary | ICD-10-CM | POA: Diagnosis not present

## 2018-01-15 DIAGNOSIS — R2242 Localized swelling, mass and lump, left lower limb: Secondary | ICD-10-CM

## 2018-01-15 MED ORDER — IOPAMIDOL (ISOVUE-300) INJECTION 61%
100.0000 mL | Freq: Once | INTRAVENOUS | Status: AC | PRN
  Administered 2018-01-15: 100 mL via INTRAVENOUS

## 2018-01-23 LAB — CUP PACEART REMOTE DEVICE CHECK
Implantable Lead Implant Date: 20120621
Implantable Lead Location: 753860
Implantable Pulse Generator Implant Date: 20120621
MDC IDC PG SERIAL: 819170
MDC IDC SESS DTM: 20190904101212

## 2018-01-30 ENCOUNTER — Encounter: Payer: Self-pay | Admitting: Internal Medicine

## 2018-01-30 ENCOUNTER — Ambulatory Visit (INDEPENDENT_AMBULATORY_CARE_PROVIDER_SITE_OTHER): Payer: Medicare Other | Admitting: Internal Medicine

## 2018-01-30 DIAGNOSIS — I255 Ischemic cardiomyopathy: Secondary | ICD-10-CM | POA: Diagnosis not present

## 2018-01-30 DIAGNOSIS — J440 Chronic obstructive pulmonary disease with acute lower respiratory infection: Secondary | ICD-10-CM | POA: Diagnosis not present

## 2018-01-30 MED ORDER — METHYLPREDNISOLONE ACETATE 40 MG/ML IJ SUSP
40.0000 mg | Freq: Once | INTRAMUSCULAR | Status: AC
  Administered 2018-01-30: 40 mg via INTRAMUSCULAR

## 2018-01-30 MED ORDER — FLUTICASONE PROPIONATE 50 MCG/ACT NA SUSP: 2.0000 | Freq: Every day | NASAL | 6 refills | Status: AC

## 2018-01-30 NOTE — Progress Notes (Signed)
   Subjective:    Patient ID: Spencer Horn, male    DOB: December 27, 1950, 67 y.o.   MRN: [id number]  HPI The patient is a 67 YO man coming in for cough and cold symptoms. Around his grandchildren which were sick this weekend. Symptoms started on Monday. He is having nose drainage, sore throat, cough, sputum production. He is having fatigue. Denies SOB. Denies fevers or chills. Overall is gradually worsening since onset. Has taken chlorocidin for cough which helped some.   Review of Systems  Constitutional: Positive for activity change, appetite change and fatigue. Negative for chills, fever and unexpected weight change.  HENT: Positive for congestion, postnasal drip, rhinorrhea and sinus pressure. Negative for ear discharge, ear pain, sinus pain, sneezing, sore throat, tinnitus, trouble swallowing and voice change.   Eyes: Negative.   Respiratory: Positive for cough. Negative for chest tightness, shortness of breath and wheezing.   Cardiovascular: Negative.   Gastrointestinal: Negative.   Musculoskeletal: Positive for myalgias.  Neurological: Negative.       Objective:   Physical Exam  Constitutional: He is oriented to person, place, and time. He appears well-developed and well-nourished.  HENT:  Head: Normocephalic and atraumatic.  Oropharynx with redness and clear drainage, nose with swollen turbinates, TMs normal bilaterally  Eyes: EOM are normal.  Neck: Normal range of motion. No thyromegaly present.  Cardiovascular: Normal rate and regular rhythm.  Pulmonary/Chest: Effort normal and breath sounds normal. No respiratory distress. He has no wheezes. He has no rales.  Abdominal: Soft.  Musculoskeletal: He exhibits no tenderness.  Lymphadenopathy:    He has no cervical adenopathy.  Neurological: He is alert and oriented to person, place, and time.  Skin: Skin is warm and dry.   Vitals:   01/30/18 0858  BP: 120/82  Pulse: 72  Temp: 98.3 F (36.8 C)  TempSrc: Oral  SpO2: 97%    Weight: 209 lb (94.8 kg)  Height: 5\' 6"  (1.676 m)      Assessment & Plan:  Depo-medrol 40 mg IM given at visit

## 2018-01-30 NOTE — Assessment & Plan Note (Signed)
Given depo-medrol 40 mg IM today to prevent flare. He is at the start of a viral respiratory illness so antibiotics were not prescribed. Rx for flonase today. Advised to use cough medicine otc for cough which is safe for high blood pressure.

## 2018-01-30 NOTE — Patient Instructions (Addendum)
We have given you a shot today which should help keep you from getting as sick. The average viral cold can last 7-10 days. Usually the worst of the symptoms are on Day 4-5.  We have sent in flonase to use 2 sprays in each nostril once a day for the next 1-2 weeks.    Viral Respiratory Infection A viral respiratory infection is an illness that affects parts of the body used for breathing, like the lungs, nose, and throat. It is caused by a germ called a virus. Some examples of this kind of infection are:  A cold.  The flu (influenza).  A respiratory syncytial virus (RSV) infection.  How do I know if I have this infection? Most of the time this infection causes:  A stuffy or runny nose.  Yellow or green fluid in the nose.  A cough.  Sneezing.  Tiredness (fatigue).  Achy muscles.  A sore throat.  Sweating or chills.  A fever.  A headache.  How is this infection treated? If the flu is diagnosed early, it may be treated with an antiviral medicine. This medicine shortens the length of time a person has symptoms. Symptoms may be treated with over-the-counter and prescription medicines, such as:  Expectorants. These make it easier to cough up mucus.  Decongestant nasal sprays.  Doctors do not prescribe antibiotic medicines for viral infections. They do not work with this kind of infection. How do I know if I should stay home? To keep others from getting sick, stay home if you have:  A fever.  A lasting cough.  A sore throat.  A runny nose.  Sneezing.  Muscles aches.  Headaches.  Tiredness.  Weakness.  Chills.  Sweating.  An upset stomach (nausea).  Follow these instructions at home:  Rest as much as possible.  Take over-the-counter and prescription medicines only as told by your doctor.  Drink enough fluid to keep your pee (urine) clear or pale yellow.  Gargle with salt water. Do this 3-4 times per day or as needed. To make a salt-water  mixture, dissolve -1 tsp of salt in 1 cup of warm water. Make sure the salt dissolves all the way.  Use nose drops made from salt water. This helps with stuffiness (congestion). It also helps soften the skin around your nose.  Do not drink alcohol.  Do not use tobacco products, including cigarettes, chewing tobacco, and e-cigarettes. If you need help quitting, ask your doctor. Get help if:  Your symptoms last for 10 days or longer.  Your symptoms get worse over time.  You have a fever.  You have very bad pain in your face or forehead.  Parts of your jaw or neck become very swollen. Get help right away if:  You feel pain or pressure in your chest.  You have shortness of breath.  You faint or feel like you will faint.  You keep throwing up (vomiting).  You feel confused. This information is not intended to replace advice given to you by your health care provider. Make sure you discuss any questions you have with your health care provider. Document Released: 04/20/2008 Document Revised: 10/14/2015 Document Reviewed: 10/14/2014 Elsevier Interactive Patient Education  2018 [employer].

## 2018-02-06 ENCOUNTER — Encounter: Payer: Self-pay | Admitting: Internal Medicine

## 2018-02-06 ENCOUNTER — Ambulatory Visit (INDEPENDENT_AMBULATORY_CARE_PROVIDER_SITE_OTHER): Payer: Medicare Other | Admitting: Internal Medicine

## 2018-02-06 VITALS — BP 130/86 | HR 74 | Ht 66.0 in | Wt 203.6 lb

## 2018-02-06 DIAGNOSIS — I5022 Chronic systolic (congestive) heart failure: Secondary | ICD-10-CM

## 2018-02-06 DIAGNOSIS — I255 Ischemic cardiomyopathy: Secondary | ICD-10-CM

## 2018-02-06 DIAGNOSIS — Z95810 Presence of automatic (implantable) cardiac defibrillator: Secondary | ICD-10-CM | POA: Diagnosis not present

## 2018-02-06 NOTE — Progress Notes (Signed)
HPI [name] returns today for followup. He is a pleasant 67 yo man with chronic systolic heart failure, s/p MI, EF 30%, obesity, and HTN. He denies chest pain, shortness of breath, or syncope. No edema. No ICD shock. He has had some discomfort in his left ankle and is pending injections. No Known Allergies   Current Outpatient Medications  Medication Sig Dispense Refill  . albuterol (PROVENTIL HFA;VENTOLIN HFA) 108 (90 Base) MCG/ACT inhaler Inhale 2 puffs into the lungs every 6 (six) hours as needed for wheezing or shortness of breath. 1 Inhaler 6  . aspirin 81 MG tablet Take 81 mg by mouth daily.      [name]. atorvastatin (LIPITOR) 40 MG tablet TAKE ONE TABLET BY MOUTH ONCE DAILY 90 tablet 1  . carvedilol (COREG) 3.125 MG tablet TAKE 1 TABLET BY MOUTH TWICE DAILY 180 tablet 1  . fluticasone (FLONASE) 50 MCG/ACT nasal spray Place 2 sprays into both nostrils daily. 16 g 6  . losartan (COZAAR) 25 MG tablet TAKE ONE TABLET BY MOUTH ONCE DAILY 90 tablet 3  . Multiple Vitamin (MULTIVITAMIN) tablet Take 1 tablet by mouth daily.    . nitroGLYCERIN (NITROSTAT) 0.4 MG SL tablet Place 1 tablet (0.4 mg total) under the tongue every 5 (five) minutes as needed for chest pain (for chest pain). 25 tablet 6  . spironolactone (ALDACTONE) 25 MG tablet TAKE ONE-HALF TABLET BY MOUTH ONCE DAILY 45 tablet 3   No current facility-administered medications for this visit.      Past Medical History:  Diagnosis Date  . Coronary artery disease   . Dyslipidemia   . Erectile dysfunction 08/18/2016  . History of acute anterior wall MI   . Hyperlipidemia   . ICD (implantable cardiac defibrillator) in place June 2012  . Ischemic cardiomyopathy   . Personal history of MRSA (methicillin resistant Staphylococcus aureus)   . Tobacco abuse   . Ventricular dysfunction 07/07/2010   Severe left ventricular dysfunction with ejection fraction of 30-35%    ROS:   All systems reviewed and negative except as noted in the  HPI.   Past Surgical History:  Procedure Laterality Date  . CARDIAC CATHETERIZATION  07/07/2010   DES to proximal LAD  . CARDIAC CATHETERIZATION  2004  . EP IMPLANTABLE DEVICE  June 2012     Family History  Problem Relation Age of Onset  . Hypertension Mother   . Bone cancer Mother   . Hyperlipidemia Father   . Bladder Cancer Father   . Diabetes Father      Social History   Socioeconomic History  . Marital status: Married    Spouse name: Not on file  . Number of children: Not on file  . Years of education: Not on file  . Highest education level: Not on file  Occupational History  . Not on file  Social Needs  . Financial resource strain: Not on file  . Food insecurity:    Worry: Not on file    Inability: Not on file  . Transportation needs:    Medical: Not on file    Non-medical: Not on file  Tobacco Use  . Smoking status: Former Smoker    Packs/day: 1.00    Years: 40.00    Pack years: 40.00    Types: Cigarettes    Start date: 1972    Last attempt to quit: 2012    Years since quitting: 7.7  . Smokeless tobacco: Never Used  Substance and Sexual  Activity  . Alcohol use: No  . Drug use: No  . Sexual activity: Yes  Lifestyle  . Physical activity:    Days per week: Not on file    Minutes per session: Not on file  . Stress: Not on file  Relationships  . Social connections:    Talks on phone: Not on file    Gets together: Not on file    Attends religious service: Not on file    Active member of club or organization: Not on file    Attends meetings of clubs or organizations: Not on file    Relationship status: Not on file  . Intimate partner violence:    Fear of current or ex partner: Not on file    Emotionally abused: Not on file    Physically abused: Not on file    Forced sexual activity: Not on file  Other Topics Concern  . Not on file  Social History Narrative  . Not on file     BP 130/86   Pulse 74   Ht 5\' 6"  (1.676 m)   Wt 203 lb 9.6 oz  (92.4 kg)   SpO2 96%   BMI 32.86 kg/m   Physical Exam:  Well appearing NAD HEENT: Unremarkable Neck:  No JVD, no thyromegally Lymphatics:  No adenopathy Back:  No CVA tenderness Lungs:  Clear with no wheezes HEART:  Regular rate rhythm, no murmurs, no rubs, no clicks Abd:  soft, positive bowel sounds, no organomegally, no rebound, no guarding Ext:  2 plus pulses, no edema, no cyanosis, no clubbing Skin:  No rashes no nodules Neuro:  CN II through XII intact, motor grossly intact  EKG - none  DEVICE  Normal device function.  See PaceArt for details.   Assess/Plan: 1. Chronic systolic heart failure - his symptoms are well controlled. No change in his meds. 2. ICD - his [employer] ICD is working normally. We will recheck in several months. 3. CAD  - He denies anginal symptoms and still remains active.  4. Obesity - he has lost a few pounds and I have asked him to eat less and to exercise more.   Leonia Reeves.D.

## 2018-02-06 NOTE — Patient Instructions (Signed)
Medication Instructions:  Your physician recommends that you continue on your current medications as directed. Please refer to the Current Medication list given to you today.  Labwork: None ordered.  Testing/Procedures: None ordered.  Follow-Up: Your physician wants you to follow-up in: one year with Dr. Ladona Ridgelaylor.   You will receive a reminder letter in the mail two months in advance. If you don't receive a letter, please call our office to schedule the follow-up appointment.  Remote monitoring is used to monitor your ICD from home. This monitoring reduces the number of office visits required to check your device to one time per year. It allows us to keep an eye on the functioning of your device to ensure it is working properly. You are scheduled for a device check from home on 03/14/2018. You may send your transmission at any time that day. If you have a wireless device, the transmission will be sent automatically. After your physician reviews your transmission, you will receive a postcard with your next transmission date.  Any Other Special Instructions Will Be Listed Below (If Applicable).  If you need a refill on your cardiac medications before your next appointment, please call your pharmacy.

## 2018-02-12 ENCOUNTER — Ambulatory Visit (INDEPENDENT_AMBULATORY_CARE_PROVIDER_SITE_OTHER): Payer: Medicare Other

## 2018-02-12 DIAGNOSIS — Z23 Encounter for immunization: Secondary | ICD-10-CM | POA: Diagnosis not present

## 2018-02-15 ENCOUNTER — Telehealth: Payer: Self-pay

## 2018-02-15 DIAGNOSIS — Z1211 Encounter for screening for malignant neoplasm of colon: Secondary | ICD-10-CM

## 2018-02-15 NOTE — Telephone Encounter (Signed)
Pt is due for cologuard - sending to assistant for assistance.

## 2018-02-18 ENCOUNTER — Telehealth: Payer: Self-pay | Admitting: Cardiology

## 2018-02-18 NOTE — Progress Notes (Signed)
[facility] [address] [place], [name] [address] Phone: [phone] Subjective:   Bruce Donath, am serving as a scribe for Dr. Antoine Primas.  I'm seeing this patient by the request  of:    CC: Left ankle swelling  BJY:[id number]  Spencer Horn is a 67 y.o. male coming in with complaint of left ankle pain. Continues to have swelling in the ankle and an increase in pain. Is here to discuss CT results.  CT scan was independently visualized by me showing no significant bony abnormality and unable to fully clarify the soft tissue.  Patient was unable to have an MRI secondary to pacemaker.  Patient states still soreness and pain no.       Past Medical History:  Diagnosis Date  . Coronary artery disease   . Dyslipidemia   . Erectile dysfunction 08/18/2016  . History of acute anterior wall MI   . Hyperlipidemia   . ICD (implantable cardiac defibrillator) in place June 2012  . Ischemic cardiomyopathy   . Personal history of MRSA (methicillin resistant Staphylococcus aureus)   . Tobacco abuse   . Ventricular dysfunction 07/07/2010   Severe left ventricular dysfunction with ejection fraction of 30-35%   Past Surgical History:  Procedure Laterality Date  . CARDIAC CATHETERIZATION  07/07/2010   DES to proximal LAD  . CARDIAC CATHETERIZATION  2004  . EP IMPLANTABLE DEVICE  June 2012   Social History   Socioeconomic History  . Marital status: Married    Spouse name: Not on file  . Number of children: Not on file  . Years of education: Not on file  . Highest education level: Not on file  Occupational History  . Not on file  Social Needs  . Financial resource strain: Not on file  . Food insecurity:    Worry: Not on file    Inability: Not on file  . Transportation needs:    Medical: Not on file    Non-medical: Not on file  Tobacco Use  . Smoking status: Former Smoker    Packs/day: 1.00    Years: 40.00    Pack years: 40.00    Types:  Cigarettes    Start date: 1972    Last attempt to quit: 2012    Years since quitting: 7.7  . Smokeless tobacco: Never Used  Substance and Sexual Activity  . Alcohol use: No  . Drug use: No  . Sexual activity: Yes  Lifestyle  . Physical activity:    Days per week: Not on file    Minutes per session: Not on file  . Stress: Not on file  Relationships  . Social connections:    Talks on phone: Not on file    Gets together: Not on file    Attends religious service: Not on file    Active member of club or organization: Not on file    Attends meetings of clubs or organizations: Not on file    Relationship status: Not on file  Other Topics Concern  . Not on file  Social History Narrative  . Not on file   No Known Allergies Family History  Problem Relation Age of Onset  . Hypertension Mother   . Bone cancer Mother   . Hyperlipidemia Father   . Bladder Cancer Father   . Diabetes Father      Current Outpatient Medications (Cardiovascular):  .  atorvastatin (LIPITOR) 40 MG tablet, TAKE ONE TABLET BY MOUTH ONCE DAILY .  carvedilol (COREG) 3.125 MG tablet, TAKE 1 TABLET BY MOUTH TWICE DAILY .  losartan (COZAAR) 25 MG tablet, TAKE ONE TABLET BY MOUTH ONCE DAILY .  nitroGLYCERIN (NITROSTAT) 0.4 MG SL tablet, Place 1 tablet (0.4 mg total) under the tongue every 5 (five) minutes as needed for chest pain (for chest pain). [name]  spironolactone (ALDACTONE) 25 MG tablet, TAKE ONE-HALF TABLET BY MOUTH ONCE DAILY  Current Outpatient Medications (Respiratory):  .  albuterol (PROVENTIL HFA;VENTOLIN HFA) 108 (90 Base) MCG/ACT inhaler, Inhale 2 puffs into the lungs every 6 (six) hours as needed for wheezing or shortness of breath. .  fluticasone (FLONASE) 50 MCG/ACT nasal spray, Place 2 sprays into both nostrils daily.  Current Outpatient Medications (Analgesics):  .  aspirin 81 MG tablet, Take 81 mg by mouth daily.     Current Outpatient Medications (Other):  [name]  Multiple Vitamin (MULTIVITAMIN)  tablet, Take 1 tablet by mouth daily.    Past medical history, social, surgical and family history all reviewed in electronic medical record.  No pertanent information unless stated regarding to the chief complaint.   Review of Systems:  No headache, visual changes, nausea, vomiting, diarrhea, constipation, dizziness, abdominal pain, skin rash, fevers, chills, night sweats, weight loss, swollen lymph nodes, , chest pain, shortness of breath, mood changes.  Positive muscle aches and body aches joint swelling  Objective  Blood pressure (!) 138/98, pulse 92, height 5\' 6"  (1.676 m), weight 209 lb (94.8 kg), SpO2 97 %.    General: No apparent distress alert and oriented x3 mood and affect normal, dressed appropriately.  HEENT: Pupils equal, extraocular movements intact  Respiratory: Patient's speak in full sentences and does not appear short of breath  Cardiovascular: No lower extremity edema, non tender, no erythema  Skin: Warm dry intact with no signs of infection or rash on extremities or on axial skeleton.  Abdomen: Soft nontender  Neuro: Cranial nerves II through XII are intact, neurovascularly intact in all extremities with 2+ DTRs and 2+ pulses.  Lymph: No lymphadenopathy of posterior or anterior cervical chain or axillae bilaterally.  Gait antalgic favoring the left ankle MSK:  tender with full range of motion and good stability and symmetric strength and tone of shoulders, elbows, wrist, hip, knee bilaterally.  Mild arthritic changes of multiple joints Left ankle still has significant swelling over the lateral aspect.  Seems to be all soft tissue.  Mild limited range of motion in all planes.  Tender to palpation over the left joint space and mild over the peroneal tendons.  Procedure: Real-time Ultrasound Guided Injection of left lateral ankle joint Device: GE Logiq Q7 Ultrasound guided injection is preferred based studies that show increased duration, increased effect, greater  accuracy, decreased procedural pain, increased response rate, and decreased cost with ultrasound guided versus blind injection.  Verbal informed consent obtained.  Time-out conducted.  Noted no overlying erythema, induration, or other signs of local infection.  Skin prepped in a sterile fashion.  Local anesthesia: Topical Ethyl chloride.  With sterile technique and under real time ultrasound guidance: With a 21-gauge 1-1/2 inch needle patient was injected with 0.5 cc of 0.5% Marcaine and 0.5 cc of Kenalog 40 mg/mL into the ankle joint. Completed without difficulty  Pain immediately resolved suggesting accurate placement of the medication.  Advised to call if fevers/chills, erythema, induration, drainage, or persistent bleeding.  Images permanently stored and available for review in the ultrasound unit.  Impression: Technically successful ultrasound guided injection.     Impression and  Recommendations:     This case required medical decision making of moderate complexity. The above documentation has been reviewed and is accurate and complete Lyndal Pulley, DO       Note: This dictation was prepared with Dragon dictation along with smaller phrase technology. Any transcriptional errors that result from this process are unintentional.

## 2018-02-18 NOTE — Telephone Encounter (Signed)
Spoke w/ pt wife and requested that he send a manual transmission b/c his home monitor has not updated in at least 7 days.   

## 2018-02-19 ENCOUNTER — Ambulatory Visit: Payer: Self-pay

## 2018-02-19 ENCOUNTER — Ambulatory Visit (INDEPENDENT_AMBULATORY_CARE_PROVIDER_SITE_OTHER): Payer: Medicare Other | Admitting: Family Medicine

## 2018-02-19 ENCOUNTER — Encounter: Payer: Self-pay | Admitting: Family Medicine

## 2018-02-19 VITALS — BP 138/98 | HR 92 | Ht 66.0 in | Wt 209.0 lb

## 2018-02-19 DIAGNOSIS — G8929 Other chronic pain: Secondary | ICD-10-CM

## 2018-02-19 DIAGNOSIS — M19072 Primary osteoarthritis, left ankle and foot: Secondary | ICD-10-CM

## 2018-02-19 DIAGNOSIS — M25572 Pain in left ankle and joints of left foot: Principal | ICD-10-CM

## 2018-02-19 DIAGNOSIS — I255 Ischemic cardiomyopathy: Secondary | ICD-10-CM | POA: Diagnosis not present

## 2018-02-19 DIAGNOSIS — R2242 Localized swelling, mass and lump, left lower limb: Secondary | ICD-10-CM

## 2018-02-19 NOTE — Patient Instructions (Signed)
Good to see you  Injected the ankle today  Hope it helps Ice is your friend Be careful next 6 hours because you will be numb  See me again in 4 weeks

## 2018-02-19 NOTE — Assessment & Plan Note (Signed)
Patient given injection.  Discussed icing regimen and home exercise.  Discussed which activities to do which wants to avoid.  I am hoping the injection does help.  Hoping some of it is an effusion and patient does have a peroneal bursitis.  CT scan did not show any bony abnormality that makes things concerning.  Follow-up again in 4 weeks

## 2018-02-19 NOTE — Telephone Encounter (Signed)
Sure

## 2018-02-19 NOTE — Telephone Encounter (Signed)
Is it okay to order cologuard?  

## 2018-02-20 NOTE — Telephone Encounter (Signed)
ordered

## 2018-03-14 ENCOUNTER — Ambulatory Visit (INDEPENDENT_AMBULATORY_CARE_PROVIDER_SITE_OTHER): Payer: Medicare Other | Admitting: *Deleted

## 2018-03-14 DIAGNOSIS — I255 Ischemic cardiomyopathy: Secondary | ICD-10-CM | POA: Diagnosis not present

## 2018-03-14 NOTE — Progress Notes (Signed)
Remote ICD transmission.   

## 2018-03-19 NOTE — Progress Notes (Signed)
[name] [address] [place], [name] [address] Phone: [phone] Subjective:   Bruce Donath, am serving as a scribe for Dr. Antoine Primas.   CC: Left ankle pain follow-up  BJY:NWGNFAOZHY  Spencer Horn is a 67 y.o. male coming in with complaint of ankle pain. States that he feels like he was kicked. Pain is from the injection he believes.  Patient states that the swelling is significantly better.  Not having as much pain.  Patient's previous work-up including a CT angiogram was unremarkable for any type of mass.  Patient is overall fairly happy with the results.  No pain on a daily basis only when he puts pressure in the area.      Past Medical History:  Diagnosis Date  . Coronary artery disease   . Dyslipidemia   . Erectile dysfunction 08/18/2016  . History of acute anterior wall MI   . Hyperlipidemia   . ICD (implantable cardiac defibrillator) in place June 2012  . Ischemic cardiomyopathy   . Personal history of MRSA (methicillin resistant Staphylococcus aureus)   . Tobacco abuse   . Ventricular dysfunction 07/07/2010   Severe left ventricular dysfunction with ejection fraction of 30-35%   Past Surgical History:  Procedure Laterality Date  . CARDIAC CATHETERIZATION  07/07/2010   DES to proximal LAD  . CARDIAC CATHETERIZATION  2004  . EP IMPLANTABLE DEVICE  June 2012   Social History   Socioeconomic History  . Marital status: Married    Spouse name: Not on file  . Number of children: Not on file  . Years of education: Not on file  . Highest education level: Not on file  Occupational History  . Not on file  Social Needs  . Financial resource strain: Not on file  . Food insecurity:    Worry: Not on file    Inability: Not on file  . Transportation needs:    Medical: Not on file    Non-medical: Not on file  Tobacco Use  . Smoking status: Former Smoker    Packs/day: 1.00    Years: 40.00    Pack years: 40.00    Types:  Cigarettes    Start date: 1972    Last attempt to quit: 2012    Years since quitting: 7.8  . Smokeless tobacco: Never Used  Substance and Sexual Activity  . Alcohol use: No  . Drug use: No  . Sexual activity: Yes  Lifestyle  . Physical activity:    Days per week: Not on file    Minutes per session: Not on file  . Stress: Not on file  Relationships  . Social connections:    Talks on phone: Not on file    Gets together: Not on file    Attends religious service: Not on file    Active member of club or organization: Not on file    Attends meetings of clubs or organizations: Not on file    Relationship status: Not on file  Other Topics Concern  . Not on file  Social History Narrative  . Not on file   No Known Allergies Family History  Problem Relation Age of Onset  . Hypertension Mother   . Bone cancer Mother   . Hyperlipidemia Father   . Bladder Cancer Father   . Diabetes Father      Current Outpatient Medications (Cardiovascular):  .  atorvastatin (LIPITOR) 40 MG tablet, TAKE ONE TABLET BY MOUTH ONCE DAILY .  carvedilol (COREG) 3.125 MG tablet, TAKE 1 TABLET BY MOUTH TWICE DAILY .  losartan (COZAAR) 25 MG tablet, TAKE ONE TABLET BY MOUTH ONCE DAILY .  nitroGLYCERIN (NITROSTAT) 0.4 MG SL tablet, Place 1 tablet (0.4 mg total) under the tongue every 5 (five) minutes as needed for chest pain (for chest pain). [name]  spironolactone (ALDACTONE) 25 MG tablet, TAKE ONE-HALF TABLET BY MOUTH ONCE DAILY  Current Outpatient Medications (Respiratory):  .  albuterol (PROVENTIL HFA;VENTOLIN HFA) 108 (90 Base) MCG/ACT inhaler, Inhale 2 puffs into the lungs every 6 (six) hours as needed for wheezing or shortness of breath. .  fluticasone (FLONASE) 50 MCG/ACT nasal spray, Place 2 sprays into both nostrils daily.  Current Outpatient Medications (Analgesics):  .  aspirin 81 MG tablet, Take 81 mg by mouth daily.     Current Outpatient Medications (Other):  [name]  Multiple Vitamin (MULTIVITAMIN)  tablet, Take 1 tablet by mouth daily.    Past medical history, social, surgical and family history all reviewed in electronic medical record.  No pertanent information unless stated regarding to the chief complaint.   Review of Systems:  No headache, visual changes, nausea, vomiting, diarrhea, constipation, dizziness, abdominal pain, skin rash, fevers, chills, night sweats, weight loss, swollen lymph nodes, body aches, joint swelling, muscle aches, chest pain, shortness of breath, mood changes.   Objective  Blood pressure 120/86, pulse 90, height 5\' 6"  (1.676 m), weight 210 lb (95.3 kg), SpO2 96 %.    General: No apparent distress alert and oriented x3 mood and affect normal, dressed appropriately.  HEENT: Pupils equal, extraocular movements intact  Respiratory: Patient's speak in full sentences and does not appear short of breath  Cardiovascular: No lower extremity edema, non tender, no erythema  Skin: Warm dry intact with no signs of infection or rash on extremities or on axial skeleton.  Abdomen: Soft nontender  Neuro: Cranial nerves II through XII are intact, neurovascularly intact in all extremities with 2+ DTRs and 2+ pulses.  Lymph: No lymphadenopathy of posterior or anterior cervical chain or axillae bilaterally.  Gait mild antalgic gait MSK:  Non tender with full range of motion and good stability and symmetric strength and tone of shoulders, elbows, wrist, hip, knee bilaterally.  Mild arthritic changes of multiple joints Left ankle exam shows the patient has significant decrease in swelling over the lateral peroneal tendons.  Patient is minimally tender with some mild bruising in the area.  Patient does have very mild limited range of motion of 5 degrees in all planes.  Mild crepitus.  Full strength though compared to the contralateral side  Limited muscular skeletal ultrasound was performed and interpreted by Judi Saa  Limited ultrasound of patient's ankle in the area  where the fluid was seen previously is significantly improved.  Significant decrease in vascularity as well. Impression: Resolved perennial cyst     Impression and Recommendations:     The above documentation has been reviewed and is accurate and complete Judi Saa, DO       Note: This dictation was prepared with Dragon dictation along with smaller phrase technology. Any transcriptional errors that result from this process are unintentional.

## 2018-03-20 ENCOUNTER — Ambulatory Visit: Payer: Self-pay

## 2018-03-20 ENCOUNTER — Ambulatory Visit (INDEPENDENT_AMBULATORY_CARE_PROVIDER_SITE_OTHER): Payer: Medicare Other | Admitting: Family Medicine

## 2018-03-20 ENCOUNTER — Encounter: Payer: Self-pay | Admitting: Family Medicine

## 2018-03-20 VITALS — BP 120/86 | HR 90 | Ht 66.0 in | Wt 210.0 lb

## 2018-03-20 DIAGNOSIS — R2242 Localized swelling, mass and lump, left lower limb: Secondary | ICD-10-CM

## 2018-03-20 DIAGNOSIS — I255 Ischemic cardiomyopathy: Secondary | ICD-10-CM | POA: Diagnosis not present

## 2018-03-20 DIAGNOSIS — M25572 Pain in left ankle and joints of left foot: Secondary | ICD-10-CM

## 2018-03-20 DIAGNOSIS — G8929 Other chronic pain: Secondary | ICD-10-CM

## 2018-03-20 NOTE — Assessment & Plan Note (Signed)
Much better 4 weeks after the last injection.  Discussed icing regimen and home exercises.  Discussed compression, patient will do topical anti-inflammatories as needed.  Follow-up as needed

## 2018-03-20 NOTE — Patient Instructions (Signed)
Good to see you  Ice is your friend Stay active Arnica lotion 2 times a day  See me when you need me

## 2018-03-22 ENCOUNTER — Other Ambulatory Visit: Payer: Self-pay | Admitting: Cardiovascular Disease

## 2018-03-28 ENCOUNTER — Telehealth: Payer: Self-pay

## 2018-03-28 NOTE — Telephone Encounter (Signed)
LMOVM requesting that pt send manual transmission b/c home monitor has not updated in at least 7 days.    

## 2018-04-05 LAB — CUP PACEART REMOTE DEVICE CHECK
Battery Remaining Longevity: 42 mo
Brady Statistic RV Percent Paced: 1 %
HIGH POWER IMPEDANCE MEASURED VALUE: 105 Ohm
HighPow Impedance: 105 Ohm
Implantable Pulse Generator Implant Date: 20120621
Lead Channel Pacing Threshold Amplitude: 1 V
Lead Channel Setting Pacing Amplitude: 2.5 V
Lead Channel Setting Pacing Pulse Width: 0.5 ms
Lead Channel Setting Sensing Sensitivity: 0.5 mV
MDC IDC LEAD IMPLANT DT: 20120621
MDC IDC LEAD LOCATION: 753860
MDC IDC MSMT BATTERY REMAINING PERCENTAGE: 36 %
MDC IDC MSMT BATTERY VOLTAGE: 2.9 V
MDC IDC MSMT LEADCHNL RV IMPEDANCE VALUE: 410 Ohm
MDC IDC MSMT LEADCHNL RV PACING THRESHOLD PULSEWIDTH: 0.5 ms
MDC IDC MSMT LEADCHNL RV SENSING INTR AMPL: 12 mV
MDC IDC PG SERIAL: 819170
MDC IDC SESS DTM: 20191024060704

## 2018-04-17 ENCOUNTER — Other Ambulatory Visit: Payer: Self-pay | Admitting: *Deleted

## 2018-04-17 MED ORDER — ATORVASTATIN CALCIUM 40 MG PO TABS: 40.0000 mg | ORAL_TABLET | Freq: Every day | ORAL | 2 refills | Status: DC

## 2018-04-17 NOTE — Telephone Encounter (Signed)
Rx has been sent to the pharmacy electronically. ° °

## 2018-06-13 ENCOUNTER — Ambulatory Visit (INDEPENDENT_AMBULATORY_CARE_PROVIDER_SITE_OTHER): Payer: Medicare Other

## 2018-06-13 DIAGNOSIS — I255 Ischemic cardiomyopathy: Secondary | ICD-10-CM | POA: Diagnosis not present

## 2018-06-14 ENCOUNTER — Encounter: Payer: Self-pay | Admitting: Cardiology

## 2018-06-14 NOTE — Progress Notes (Signed)
Remote ICD transmission.   

## 2018-06-15 ENCOUNTER — Other Ambulatory Visit: Payer: Self-pay | Admitting: Cardiovascular Disease

## 2018-06-16 LAB — CUP PACEART REMOTE DEVICE CHECK
Battery Remaining Longevity: 41 mo
Date Time Interrogation Session: 20200123083815
HIGH POWER IMPEDANCE MEASURED VALUE: 100 Ohm
HighPow Impedance: 100 Ohm
Implantable Pulse Generator Implant Date: 20120621
Lead Channel Impedance Value: 430 Ohm
Lead Channel Pacing Threshold Amplitude: 1 V
Lead Channel Pacing Threshold Pulse Width: 0.5 ms
Lead Channel Setting Pacing Amplitude: 2.5 V
Lead Channel Setting Sensing Sensitivity: 0.5 mV
MDC IDC LEAD IMPLANT DT: 20120621
MDC IDC LEAD LOCATION: 753860
MDC IDC MSMT BATTERY REMAINING PERCENTAGE: 35 %
MDC IDC MSMT BATTERY VOLTAGE: 2.89 V
MDC IDC MSMT LEADCHNL RV SENSING INTR AMPL: 12 mV
MDC IDC SET LEADCHNL RV PACING PULSEWIDTH: 0.5 ms
MDC IDC STAT BRADY RV PERCENT PACED: 1 %
Pulse Gen Serial Number: 819170

## 2018-06-17 NOTE — Telephone Encounter (Signed)
Please review for refill. Thank you! 

## 2018-06-21 ENCOUNTER — Telehealth: Payer: Self-pay | Admitting: Cardiovascular Disease

## 2018-06-21 DIAGNOSIS — E7800 Pure hypercholesterolemia, unspecified: Secondary | ICD-10-CM

## 2018-06-21 NOTE — Telephone Encounter (Signed)
Spoke with pt wife, orders for fasting lipid and cmet placed.

## 2018-06-21 NOTE — Telephone Encounter (Signed)
New message   Per patient's wife please put in a lab order before the patient's f/u appt. Patient would like to do lab work before the appt.

## 2018-06-25 ENCOUNTER — Telehealth: Payer: Self-pay | Admitting: Cardiovascular Disease

## 2018-06-25 NOTE — Telephone Encounter (Signed)
Spoke with wife and advised to call PCP, verbalized understanding.

## 2018-06-25 NOTE — Telephone Encounter (Signed)
New Message   Patients wife is calling on his behalf. She states that they took their gransdon to the doctor and the grandson was diagnosed with the flu. The doctor there advised that he Celene Kras) needed to contact his cardiologist to get some medication to prevent him from getting the flu so he is now at risk. Please call to discuss.

## 2018-06-26 ENCOUNTER — Other Ambulatory Visit: Payer: Self-pay | Admitting: Cardiovascular Disease

## 2018-06-26 ENCOUNTER — Other Ambulatory Visit: Payer: Self-pay | Admitting: Internal Medicine

## 2018-06-26 MED ORDER — OSELTAMIVIR PHOSPHATE 75 MG PO CAPS: 75.0000 mg | ORAL_CAPSULE | Freq: Two times a day (BID) | ORAL | 0 refills | Status: DC

## 2018-08-09 DIAGNOSIS — H2513 Age-related nuclear cataract, bilateral: Secondary | ICD-10-CM | POA: Diagnosis not present

## 2018-08-09 DIAGNOSIS — H40033 Anatomical narrow angle, bilateral: Secondary | ICD-10-CM | POA: Diagnosis not present

## 2018-09-04 ENCOUNTER — Ambulatory Visit: Payer: Medicare Other | Admitting: Cardiovascular Disease

## 2018-09-12 ENCOUNTER — Ambulatory Visit (INDEPENDENT_AMBULATORY_CARE_PROVIDER_SITE_OTHER): Payer: Medicare Other | Admitting: *Deleted

## 2018-09-12 ENCOUNTER — Other Ambulatory Visit: Payer: Self-pay

## 2018-09-12 DIAGNOSIS — I255 Ischemic cardiomyopathy: Secondary | ICD-10-CM

## 2018-09-12 LAB — CUP PACEART REMOTE DEVICE CHECK
Battery Remaining Longevity: 38 mo
Battery Remaining Percentage: 34 %
Battery Voltage: 2.87 V
Brady Statistic RV Percent Paced: 1 %
Date Time Interrogation Session: 20200423071108
HighPow Impedance: 97 Ohm
HighPow Impedance: 97 Ohm
Implantable Lead Implant Date: 20120621
Implantable Lead Location: 753860
Implantable Pulse Generator Implant Date: 20120621
Lead Channel Impedance Value: 510 Ohm
Lead Channel Sensing Intrinsic Amplitude: 12 mV
Lead Channel Setting Pacing Amplitude: 2.5 V
Lead Channel Setting Pacing Pulse Width: 0.5 ms
Lead Channel Setting Sensing Sensitivity: 0.5 mV
Pulse Gen Serial Number: 819170

## 2018-09-16 ENCOUNTER — Other Ambulatory Visit: Payer: Self-pay | Admitting: Cardiovascular Disease

## 2018-09-16 NOTE — Telephone Encounter (Signed)
losartan 25 mg refilled.

## 2018-09-20 ENCOUNTER — Encounter: Payer: Self-pay | Admitting: Cardiology

## 2018-09-20 NOTE — Progress Notes (Signed)
Remote ICD transmission.   

## 2018-12-12 ENCOUNTER — Ambulatory Visit (INDEPENDENT_AMBULATORY_CARE_PROVIDER_SITE_OTHER): Payer: Medicare Other | Admitting: *Deleted

## 2018-12-12 DIAGNOSIS — I5022 Chronic systolic (congestive) heart failure: Secondary | ICD-10-CM | POA: Diagnosis not present

## 2018-12-12 DIAGNOSIS — I255 Ischemic cardiomyopathy: Secondary | ICD-10-CM

## 2018-12-12 LAB — CUP PACEART REMOTE DEVICE CHECK
Battery Remaining Longevity: 36 mo
Battery Remaining Percentage: 32 %
Battery Voltage: 2.87 V
Brady Statistic RV Percent Paced: 1 %
Date Time Interrogation Session: 20200723074257
HighPow Impedance: 97 Ohm
HighPow Impedance: 97 Ohm
Implantable Lead Implant Date: 20120621
Implantable Lead Location: 753860
Implantable Pulse Generator Implant Date: 20120621
Lead Channel Impedance Value: 710 Ohm
Lead Channel Sensing Intrinsic Amplitude: 12 mV
Lead Channel Setting Pacing Amplitude: 2.5 V
Lead Channel Setting Pacing Pulse Width: 0.5 ms
Lead Channel Setting Sensing Sensitivity: 0.5 mV
Pulse Gen Serial Number: 819170

## 2018-12-14 ENCOUNTER — Other Ambulatory Visit: Payer: Self-pay | Admitting: Cardiovascular Disease

## 2018-12-24 NOTE — Progress Notes (Signed)
Remote ICD transmission.   

## 2019-01-06 ENCOUNTER — Telehealth: Payer: Self-pay | Admitting: Cardiovascular Disease

## 2019-01-06 ENCOUNTER — Telehealth: Payer: Self-pay | Admitting: Internal Medicine

## 2019-01-06 NOTE — Telephone Encounter (Signed)
The patient's wife was calling about the appointment with Dr. Lovena Le in November not the appointment with Dr. Oval Linsey. She has been advised that as of right now, patients will need to come alone to the appointment unless there is a medical necessity reason.   Message has been routed to the provider's nurse for her recommendation.

## 2019-01-06 NOTE — Telephone Encounter (Signed)
  Wife is requesting to come with patient to appt on 04/01/19 to see Dr Lovena Le

## 2019-01-06 NOTE — Telephone Encounter (Signed)
  Wife is requesting to come with patient to appt on 01/20/19 with Dr Oval Linsey

## 2019-01-13 DIAGNOSIS — E7800 Pure hypercholesterolemia, unspecified: Secondary | ICD-10-CM | POA: Diagnosis not present

## 2019-01-13 LAB — COMPREHENSIVE METABOLIC PANEL
ALT: 14 IU/L (ref 0–44)
AST: 20 IU/L (ref 0–40)
Albumin/Globulin Ratio: 1.5 (ref 1.2–2.2)
Albumin: 4.3 g/dL (ref 3.8–4.8)
Alkaline Phosphatase: 89 IU/L (ref 39–117)
BUN/Creatinine Ratio: 16 (ref 10–24)
BUN: 16 mg/dL (ref 8–27)
Bilirubin Total: 0.7 mg/dL (ref 0.0–1.2)
CO2: 21 mmol/L (ref 20–29)
Calcium: 9.4 mg/dL (ref 8.6–10.2)
Chloride: 98 mmol/L (ref 96–106)
Creatinine, Ser: 0.98 mg/dL (ref 0.76–1.27)
GFR calc Af Amer: 92 mL/min/{1.73_m2} (ref >59)
GFR calc non Af Amer: 80 mL/min/{1.73_m2} (ref >59)
Globulin, Total: 2.8 g/dL (ref 1.5–4.5)
Glucose: 121 mg/dL — ABNORMAL HIGH (ref 65–99)
Potassium: 4.7 mmol/L (ref 3.5–5.2)
Sodium: 137 mmol/L (ref 134–144)
Total Protein: 7.1 g/dL (ref 6.0–8.5)

## 2019-01-13 LAB — LIPID PANEL
Chol/HDL Ratio: 3.1 ratio (ref 0.0–5.0)
Cholesterol, Total: 110 mg/dL (ref 100–199)
HDL: 35 mg/dL — ABNORMAL LOW (ref >39)
LDL Calculated: 50 mg/dL (ref 0–99)
Triglycerides: 125 mg/dL (ref 0–149)
VLDL Cholesterol Cal: 25 mg/dL (ref 5–40)

## 2019-01-15 ENCOUNTER — Other Ambulatory Visit: Payer: Self-pay | Admitting: Cardiovascular Disease

## 2019-01-15 MED ORDER — LOSARTAN POTASSIUM 25 MG PO TABS: 25.0000 mg | ORAL_TABLET | Freq: Every day | ORAL | 0 refills | Status: DC

## 2019-01-15 MED ORDER — CARVEDILOL 3.125 MG PO TABS: 3.1250 mg | ORAL_TABLET | Freq: Two times a day (BID) | ORAL | 0 refills | Status: DC

## 2019-01-15 MED ORDER — SPIRONOLACTONE 25 MG PO TABS: 12.5000 mg | ORAL_TABLET | Freq: Every day | ORAL | 0 refills | Status: DC

## 2019-01-15 NOTE — Telephone Encounter (Signed)
Spoke wife patient's wife and let her know that I refilled the medications that the patient requested to be refilled.

## 2019-01-15 NOTE — Telephone Encounter (Signed)
° ° °*  STAT* If patient is at the pharmacy, call can be transferred to refill team.   1. Which medications need to be refilled? (please list name of each medication and dose if known)  carvedilol (COREG) 3.125 MG tablet spironolactone (ALDACTONE) 25 MG tablet losartan (COZAAR) 25 MG tablet  2. Which pharmacy/location (including street and city if local pharmacy) is medication to be sent to?  [place] ([place]), [place] - [address]  3. Do they need a 30 day or 90 day supply? 90  Pt did not want to do a virtual visit with Dr. Oval Linsey on 08/31. He has an appt scheduled for 04/02/19 but will need more medication before that appt

## 2019-01-16 ENCOUNTER — Telehealth: Payer: Self-pay | Admitting: Cardiovascular Disease

## 2019-01-16 NOTE — Telephone Encounter (Signed)
°  Wife of patient called. The patient received a call from our office in regards to some lab results. The wife was returning the cal. The patient is home now and will be able to take any future phone calls.

## 2019-01-16 NOTE — Telephone Encounter (Signed)
PT AWARE OF LAB RESULTS./CY 

## 2019-01-20 ENCOUNTER — Ambulatory Visit: Payer: Medicare Other | Admitting: Cardiovascular Disease

## 2019-02-10 ENCOUNTER — Other Ambulatory Visit: Payer: Self-pay

## 2019-02-10 ENCOUNTER — Ambulatory Visit (INDEPENDENT_AMBULATORY_CARE_PROVIDER_SITE_OTHER): Payer: Medicare Other | Admitting: Internal Medicine

## 2019-02-10 ENCOUNTER — Other Ambulatory Visit (INDEPENDENT_AMBULATORY_CARE_PROVIDER_SITE_OTHER): Payer: Medicare Other

## 2019-02-10 ENCOUNTER — Encounter: Payer: Self-pay | Admitting: Internal Medicine

## 2019-02-10 VITALS — BP 118/82 | HR 83 | Temp 98.6°F | Ht 66.0 in | Wt 212.0 lb

## 2019-02-10 DIAGNOSIS — Z0000 Encounter for general adult medical examination without abnormal findings: Secondary | ICD-10-CM

## 2019-02-10 DIAGNOSIS — J42 Unspecified chronic bronchitis: Secondary | ICD-10-CM

## 2019-02-10 DIAGNOSIS — E785 Hyperlipidemia, unspecified: Secondary | ICD-10-CM

## 2019-02-10 DIAGNOSIS — R7301 Impaired fasting glucose: Secondary | ICD-10-CM

## 2019-02-10 DIAGNOSIS — Z23 Encounter for immunization: Secondary | ICD-10-CM | POA: Diagnosis not present

## 2019-02-10 DIAGNOSIS — Z1211 Encounter for screening for malignant neoplasm of colon: Secondary | ICD-10-CM

## 2019-02-10 DIAGNOSIS — I10 Essential (primary) hypertension: Secondary | ICD-10-CM | POA: Diagnosis not present

## 2019-02-10 LAB — CBC
HCT: 44.6 % (ref 39.0–52.0)
Hemoglobin: 15 g/dL (ref 13.0–17.0)
MCHC: 33.5 g/dL (ref 30.0–36.0)
MCV: 86.1 fl (ref 78.0–100.0)
Platelets: 191 10*3/uL (ref 150.0–400.0)
RBC: 5.18 Mil/uL (ref 4.22–5.81)
RDW: 13.3 % (ref 11.5–15.5)
WBC: 7.1 10*3/uL (ref 4.0–10.5)

## 2019-02-10 LAB — HEMOGLOBIN A1C: Hgb A1c MFr Bld: 7 % — ABNORMAL HIGH (ref 4.6–6.5)

## 2019-02-10 MED ORDER — ALBUTEROL SULFATE HFA 108 (90 BASE) MCG/ACT IN AERS: 2.0000 | INHALATION_SPRAY | Freq: Four times a day (QID) | RESPIRATORY_TRACT | 1 refills | Status: DC | PRN

## 2019-02-10 NOTE — Progress Notes (Signed)
Subjective:   Patient ID: Spencer Horn, male    DOB: Apr 17, 1951, 68 y.o.   MRN: [id number]  HPI Here for medicare wellness and physical, no new complaints. Please see A/P for status and treatment of chronic medical problems.   Diet: heart healthy Physical activity: sedentary Depression/mood screen: negative Hearing: intact to whispered voice Visual acuity: grossly normal, performs annual eye exam  ADLs: capable Fall risk: none Home safety: good Cognitive evaluation: intact to orientation, naming, recall and repetition EOL planning: adv directives discussed    [facility]10/[facility]  PHQ-2 Total Score  0      I have personally reviewed and have noted 1. The patient's medical and social history - reviewed today no changes 2. Their use of alcohol, tobacco or illicit drugs 3. Their current medications and supplements 4. The patient's functional ability including ADL's, fall risks, home safety risks and hearing or visual impairment. 5. Diet and physical activities 6. Evidence for depression or mood disorders 7. Care team reviewed and updated  Patient Care Team: [name], Kyndal Heringer A, MD as PCP - General (Internal Medicine) Chilton Siandolph, Tiffany, MD as Consulting Physician (Cardiology) Past Medical History:  Diagnosis Date  . Coronary artery disease   . Dyslipidemia   . Erectile dysfunction 08/18/2016  . History of acute anterior wall MI   . Hyperlipidemia   . ICD (implantable cardiac defibrillator) in place June 2012  . Ischemic cardiomyopathy   . Personal history of MRSA (methicillin resistant Staphylococcus aureus)   . Tobacco abuse   . Ventricular dysfunction 07/07/2010   Severe left ventricular dysfunction with ejection fraction of 30-35%   Past Surgical History:  Procedure Laterality Date  . CARDIAC CATHETERIZATION  07/07/2010   DES to proximal LAD  . CARDIAC CATHETERIZATION  2004  . EP IMPLANTABLE DEVICE  June 2012    Family History  Problem Relation Age of Onset  . Hypertension Mother   . Bone cancer Mother   . Hyperlipidemia Father   . Bladder Cancer Father   . Diabetes Father    Review of Systems  Constitutional: Negative.   HENT: Negative.   Eyes: Negative.   Respiratory: Negative for cough, chest tightness and shortness of breath.   Cardiovascular: Negative for chest pain, palpitations and leg swelling.  Gastrointestinal: Negative for abdominal distention, abdominal pain, constipation, diarrhea, nausea and vomiting.  Musculoskeletal: Negative.   Skin: Negative.   Neurological: Negative.   Psychiatric/Behavioral: Negative.     Objective:  Physical Exam Constitutional:      Appearance: He is well-developed. He is obese.  HENT:     Head: Normocephalic and atraumatic.  Neck:     Musculoskeletal: Normal range of motion.  Cardiovascular:     Rate and Rhythm: Normal rate and regular rhythm.  Pulmonary:     Effort: Pulmonary effort is normal. No respiratory distress.     Breath sounds: Normal breath sounds. No wheezing or rales.  Abdominal:     General: Bowel sounds are normal. There is no distension.     Palpations: Abdomen is soft.     Tenderness: There is no abdominal tenderness. There is no rebound.  Skin:    General: Skin is warm and dry.  Neurological:     Mental Status: He is alert and oriented to person, place, and time.     Coordination: Coordination normal.     Vitals:   02/10/19 1422  BP: 118/82  Pulse: 83  Temp: 98.6 F (  37 C)  TempSrc: Oral  SpO2: 94%  Weight: 212 lb (96.2 kg)  Height: 5\' 6"  (1.676 m)    Assessment & Plan:  Flu shot given at visit

## 2019-02-10 NOTE — Patient Instructions (Signed)

## 2019-02-11 DIAGNOSIS — Z0000 Encounter for general adult medical examination without abnormal findings: Secondary | ICD-10-CM | POA: Insufficient documentation

## 2019-02-11 NOTE — Assessment & Plan Note (Signed)
BP at goal on coreg and losartan and spironolactone. Recent CMP okay.

## 2019-02-11 NOTE — Assessment & Plan Note (Signed)
No HgA1c recently. Sugars are elevated on recent fasting labs to 121. Checking HgA1c and we talked about potential progression to diabetes. He is on statin and ARB already.

## 2019-02-11 NOTE — Assessment & Plan Note (Signed)
Recent lipid panel okay and taking lipitor 40 mg daily. Reviewed with him today.

## 2019-02-11 NOTE — Assessment & Plan Note (Signed)
Flu shot given. Pneumonia given 23 today. Shingrix counseled. Tetanus due 2027. Cologuard ordered as due. Counseled about sun safety and mole surveillance. Counseled about the dangers of distracted driving. Given 10 year screening recommendations.

## 2019-02-11 NOTE — Assessment & Plan Note (Signed)
SOB is stable and exercise is not much at this time. Encouraged to start exercise routine. Non-smoker since 2012 and encouraged lifelong cessation.

## 2019-02-21 LAB — COLOGUARD: Cologuard: NEGATIVE

## 2019-03-14 ENCOUNTER — Ambulatory Visit (INDEPENDENT_AMBULATORY_CARE_PROVIDER_SITE_OTHER): Payer: Medicare Other | Admitting: *Deleted

## 2019-03-14 DIAGNOSIS — I5022 Chronic systolic (congestive) heart failure: Secondary | ICD-10-CM

## 2019-03-14 DIAGNOSIS — I519 Heart disease, unspecified: Secondary | ICD-10-CM

## 2019-03-16 LAB — CUP PACEART REMOTE DEVICE CHECK
Battery Remaining Longevity: 37 mo
Battery Remaining Percentage: 31 %
Battery Voltage: 2.86 V
Brady Statistic RV Percent Paced: 1 %
Date Time Interrogation Session: 20201023055015
HighPow Impedance: 95 Ohm
HighPow Impedance: 95 Ohm
Implantable Lead Implant Date: 20120621
Implantable Lead Location: 753860
Implantable Pulse Generator Implant Date: 20120621
Lead Channel Impedance Value: 830 Ohm
Lead Channel Pacing Threshold Amplitude: 1 V
Lead Channel Pacing Threshold Pulse Width: 0.5 ms
Lead Channel Sensing Intrinsic Amplitude: 12 mV
Lead Channel Setting Pacing Amplitude: 2.5 V
Lead Channel Setting Pacing Pulse Width: 0.5 ms
Lead Channel Setting Sensing Sensitivity: 0.5 mV
Pulse Gen Serial Number: 819170

## 2019-03-18 ENCOUNTER — Telehealth: Payer: Self-pay | Admitting: Cardiovascular Disease

## 2019-03-18 ENCOUNTER — Telehealth: Payer: Self-pay | Admitting: General Practice

## 2019-03-18 NOTE — Telephone Encounter (Signed)
New Message:  [name], wife of the patient really would like to be with her husband during his upcoming appointment with Coletta Memos. I informed the wife of the office policy, and she states that he does not have the best memory. She feels four ears  and two heads are better than one. Please let the wife know if this will be allowed or not.

## 2019-03-18 NOTE — Telephone Encounter (Signed)
New message:    Patient wife calling her husband need a note for jury duty. Please call patient wife back.

## 2019-03-19 NOTE — Telephone Encounter (Signed)
OK for him to have a note saying he is high risk for COVID-19 exposure and shouldn't be indoor spaces with others.  However, I don't generally provide notes for jury duty unless someone is physically disabled and unable to walk into a courtroom.

## 2019-03-19 NOTE — Telephone Encounter (Signed)
Juror # [id number] Date 05/07/2019 Location [place]  Will forward to Dr Oval Linsey for review   Patient will be in office to pick up on 03/26/19

## 2019-03-24 ENCOUNTER — Ambulatory Visit: Payer: Medicare Other | Admitting: General Practice

## 2019-03-24 NOTE — Progress Notes (Signed)
Remote ICD transmission.   

## 2019-03-25 NOTE — Progress Notes (Addendum)
Cardiology Clinic Note   Patient Name: Spencer Horn Date of Encounter: 03/26/2019  Primary Care Provider:  Hoyt Koch, MD Primary Cardiologist:  Skeet Latch, MD  Patient Profile    Spencer Horn 68 year old male presents today for follow-up of his coronary artery disease, ventricular dysfunction, hypertension, and chronic systolic heart failure.  Past Medical History    Past Medical History:  Diagnosis Date  . Coronary artery disease   . Dyslipidemia   . Erectile dysfunction 08/18/2016  . History of acute anterior wall MI   . Hyperlipidemia   . ICD (implantable cardiac defibrillator) in place June 2012  . Ischemic cardiomyopathy   . Personal history of MRSA (methicillin resistant Staphylococcus aureus)   . Tobacco abuse   . Ventricular dysfunction 07/07/2010   Severe left ventricular dysfunction with ejection fraction of 30-35%   Past Surgical History:  Procedure Laterality Date  . CARDIAC CATHETERIZATION  07/07/2010   DES to proximal LAD  . CARDIAC CATHETERIZATION  2004  . EP IMPLANTABLE DEVICE  June 2012    Allergies  No Known Allergies  History of Present Illness    Spencer Horn has a past medical history of coronary artery disease status post MI, HTN, chronic systolic and diastolic CHF, ED, COPD, and hyperlipidemia.  He had myocardial infarction 11/2002 and February 2012.  With his second MI he was treated with DES x1 to the LAD.  His LVEF was 35% and subsequently underwent implantation of an ICD.  He had continued complaints of fatigue and his CBC, thyroid, vitamin D and testosterone levels were evaluated which were all normal.  He also has a history of exertional dyspnea.  His wife indicated that he snores mildly however, he is reluctant to have a sleep study.  He states that he would not wear a CPAP device and therefore is not willing to undergo testing.  He was referred for PFTs that showed severe COPD and emphysema.  His Entresto was switched back to  losartan due to cost and due to not having symptom improvement.  He was referred to Dr. Chase Caller who started Spiriva.  He indicated that the Spiriva was not helpful so he stopped using it.  He does indicate that he has uses rescue albuterol inhaler as needed.  He only needs his rescue inhaler a couple times a year.     He was last seen by Dr. Oval Linsey on 11/16/2017.  During that time he was doing well denied chest pain, increased shortness of breath, lower extremity edema, orthopnea, or PND as well as palpitations, lightheadedness and dizziness.  He presents to the clinic today and states he is doing well.  He continues to be active around his house doing yard work and inside housework.  His wife states that she is unable to do housework due to her limited mobility therefore he manages their house.  He states at times he is tired through the day especially after a busy day of physical activity on the previous day.  He was asked again about his sleep and sleep apnea.  His wife indicated that at times she does hear him stop breathing.  A sleep study was offered for consideration.  His wife also states that at his PCP he was told he had prediabetes.  I will give diabetic diet information to them today.  He denies chest pain, increased shortness of breath, lower extremity edema,  palpitations, melena, hematuria, hemoptysis, diaphoresis, weakness, presyncope, syncope, orthopnea, and PND.  Home Medications  Prior to Admission medications   Medication Sig Start Date End Date Taking? Authorizing Provider  albuterol (VENTOLIN HFA) 108 (90 Base) MCG/ACT inhaler Inhale 2 puffs into the lungs every 6 (six) hours as needed for wheezing or shortness of breath. 02/10/19   [name], [name], MD  aspirin 81 MG tablet Take 81 mg by mouth daily.      [provider]  atorvastatin (LIPITOR) 40 MG tablet Take 1 tablet (40 mg total) by mouth daily. 04/17/18   Chilton Siandolph, Tiffany, MD  carvedilol (COREG) 3.125 MG  tablet Take 1 tablet (3.125 mg total) by mouth 2 (two) times daily. KEEP OV. 01/15/19   Chilton Siandolph, Tiffany, MD  fluticasone [facility](FLONASE) 50 MCG/ACT nasal spray Place 2 sprays into both nostrils daily. 01/30/18   [name], [name], MD  losartan (COZAAR) 25 MG tablet Take 1 tablet (25 mg total) by mouth daily. KEEP OV. 01/15/19   Chilton Siandolph, Tiffany, MD  Multiple Vitamin (MULTIVITAMIN) tablet Take 1 tablet by mouth daily.    [provider]  nitroGLYCERIN (NITROSTAT) 0.4 MG SL tablet Place 1 tablet (0.4 mg total) under the tongue every 5 (five) minutes as needed for chest pain (for chest pain). 06/21/16   Chilton Siandolph, Tiffany, MD  spironolactone (ALDACTONE) 25 MG tablet Take 0.5 tablets (12.5 mg total) by mouth daily. KEEP OV. 01/15/19   Chilton Siandolph, Tiffany, MD    Family History    Family History  Problem Relation Age of Onset  . Hypertension Mother   . Bone cancer Mother   . Hyperlipidemia Father   . Bladder Cancer Father   . Diabetes Father    He indicated that his mother is deceased. He indicated that his father is deceased. He indicated that his sister is alive. He indicated that both of his brothers are alive. He indicated that his maternal grandmother is deceased. He indicated that his maternal grandfather is deceased. He indicated that his paternal grandmother is deceased. He indicated that his paternal grandfather is deceased.  Social History    Social History   Socioeconomic History  . Marital status: Married    Spouse name: Not on file  . Number of children: Not on file  . Years of education: Not on file  . Highest education level: Not on file  Occupational History  . Not on file  Social Needs  . Financial resource strain: Not on file  . Food insecurity    Worry: Not on file    Inability: Not on file  . Transportation needs    Medical: Not on file    Non-medical: Not on file  Tobacco Use  . Smoking status: Former Smoker    Packs/day: 1.00    Years: 40.00    Pack  years: 40.00    Types: Cigarettes    Start date: 331972    Quit date: 2012    Years since quitting: 8.8  . Smokeless tobacco: Never Used  Substance and Sexual Activity  . Alcohol use: No  . Drug use: No  . Sexual activity: Yes  Lifestyle  . Physical activity    Days per week: Not on file    Minutes per session: Not on file  . Stress: Not on file  Relationships  . Social Musicianconnections    Talks on phone: Not on file    Gets together: Not on file    Attends religious service: Not on file    Active member of club or organization: Not on file    Attends meetings  of clubs or organizations: Not on file    Relationship status: Not on file  . Intimate partner violence    Fear of current or ex partner: Not on file    Emotionally abused: Not on file    Physically abused: Not on file    Forced sexual activity: Not on file  Other Topics Concern  . Not on file  Social History Narrative  . Not on file     Review of Systems    General:  No chills, fever, night sweats or weight changes.  Cardiovascular:  No chest pain, dyspnea on exertion, edema, orthopnea, palpitations, paroxysmal nocturnal dyspnea. Dermatological: No rash, lesions/masses Respiratory: No cough, dyspnea Urologic: No hematuria, dysuria Abdominal:   No nausea, vomiting, diarrhea, bright red blood per rectum, melena, or hematemesis Neurologic:  No visual changes, wkns, changes in mental status. All other systems reviewed and are otherwise negative except as noted above.  Physical Exam    VS:  BP 111/83   Pulse 81   Ht 5\' 6"  (1.676 m)   Wt 209 lb 6.4 oz (95 kg)   SpO2 98%   BMI 33.80 kg/m  , BMI Body mass index is 33.8 kg/m. GEN: Well nourished, well developed, in no acute distress. HEENT: normal. Neck: Supple, no JVD, carotid bruits, or masses. Cardiac: RRR, no murmurs, rubs, or gallops. No clubbing, cyanosis, edema.  Radials/DP/PT 2+ and equal bilaterally.  Respiratory:  Respirations regular and unlabored, clear  to auscultation bilaterally. GI: Soft, nontender, nondistended, BS + x 4. MS: no deformity or atrophy. Skin: warm and dry, no rash. Neuro:  Strength and sensation are intact. Psych: Normal affect.  Accessory Clinical Findings    ECG personally reviewed by me today-normal sinus rhythm cannot rule out anterior infarct age undetermined 83 bpm- No acute changes  EKG 11/16/2017 Normal sinus rhythm septal infarct undetermined age 24 bpm  Echocardiogram 06/18/2015 Study Conclusions  - Left ventricle: LVEF is severely depressed at approximately 35 to   40% with severe hypokinesis of the mid/distal lateral, mid/distal   septal, distal inferior, apical wals. The cavity size was normal. - Ventricular septum: The contour showed systolic flattening.   Assessment & Plan   1.  Chronic systolic and diastolic CHF-no increased shortness of breath today.  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  Echocardiogram on 05/2015 showed LVEF of 35 to 40%.  [employer] ICD placed by EP on 10/2010.  (Estimated 37 months of battery life remaining) weight today stable 209.4 pounds down from 212 pounds on 02/10/2019 Continue carvedilol 3.125 twice daily Continue losartan 25 mg daily Continue spironolactone 12.5 mg daily Daily weights-keep log and bring to next visit  Coronary artery disease-no chest pain today or increased dyspnea with exertion. MI 11/2002 and February 2012.  With his second MI he was treated with DES x1 to the LAD.  His LVEF was 35% and subsequently underwent implantation of an ICD.  Continue 81 mg aspirin daily Continue carvedilol 3.125 twice daily Continue atorvastatin 40 mg daily Heart healthy low-sodium diet-salty 6 given, diabetic diet information given. Increase physical activity as tolerated  Hyperlipidemia-01/13/2019: Cholesterol, Total 110; HDL 35; LDL Calculated 50; Triglycerides 125 Continue atorvastatin 40 mg daily Heart healthy low-sodium high-fiber diet Increase  physical activity as tolerated  Essential hypertension-BP today 111/83 Continue carvedilol 3.125 twice daily Continue losartan daily 25 mg daily Heart healthy low-sodium diet  Increase physical activity as tolerated  Snoring-wife indicates that patient snores nightly and has episodes of  apnea. Recommend sleep study-patient medicated here with think about test. Educated about risk of atrial fibrillation and associated risks with sleep apnea.     Disposition: Follow-up with Dr. Duke Salvia in 1 year.     Thomasene Ripple. Jaymere Alen NP-C     [facility] [address] Office [phone] Fax [phone]

## 2019-03-26 ENCOUNTER — Encounter: Payer: Self-pay | Admitting: General Practice

## 2019-03-26 ENCOUNTER — Other Ambulatory Visit: Payer: Self-pay

## 2019-03-26 ENCOUNTER — Ambulatory Visit: Payer: Medicare Other | Admitting: General Practice

## 2019-03-26 ENCOUNTER — Encounter: Payer: Self-pay | Admitting: *Deleted

## 2019-03-26 VITALS — BP 111/83 | HR 81 | Ht 66.0 in | Wt 209.4 lb

## 2019-03-26 DIAGNOSIS — I5022 Chronic systolic (congestive) heart failure: Secondary | ICD-10-CM

## 2019-03-26 DIAGNOSIS — I10 Essential (primary) hypertension: Secondary | ICD-10-CM | POA: Diagnosis not present

## 2019-03-26 DIAGNOSIS — R0683 Snoring: Secondary | ICD-10-CM | POA: Diagnosis not present

## 2019-03-26 DIAGNOSIS — I2510 Atherosclerotic heart disease of native coronary artery without angina pectoris: Secondary | ICD-10-CM

## 2019-03-26 DIAGNOSIS — E7800 Pure hypercholesterolemia, unspecified: Secondary | ICD-10-CM

## 2019-03-26 NOTE — Telephone Encounter (Signed)
Letter done and will have printed for visit today

## 2019-03-26 NOTE — Patient Instructions (Addendum)
Special Instructions: PLEASE FOLLOW THE ENCLOSED DIET.  Follow-Up: IN 12 months Please call our office 2 months in advance, SEPT 2021 to schedule this NOV 2021 appointment. Either In Person or Virtual You may see Chilton Si, MD Benancio Deeds or one of the following Advanced Practice Providers on your designated Care Team:  Corine Shelter, PA-C  [place], [place]  [name], [place].    Medication Instructions:  The current medical regimen is effective;  continue present plan and medications as directed. Please refer to the Current Medication list given to you today. If you need a refill on your cardiac medications before your next appointment, please call your pharmacy.  At [facility], you and your health needs are our priority.  As part of our continuing mission to provide you with exceptional heart care, we have created designated Provider Care Teams.  These Care Teams include your primary Cardiologist (physician) and Advanced Practice Providers (APPs -  Physician Assistants and Nurse Practitioners) who all work together to provide you with the care you need, when you need it.  Thank you for choosing [facility] at [facility]!!        Diabetes Mellitus and Nutrition, Adult When you have diabetes (diabetes mellitus), it is very important to have healthy eating habits because your blood sugar (glucose) levels are greatly affected by what you eat and drink. Eating healthy foods in the appropriate amounts, at about the same times every day, can help you:  Control your blood glucose.  Lower your risk of heart disease.  Improve your blood pressure.  Reach or maintain a healthy weight. Every person with diabetes is different, and each person has different needs for a meal plan. Your health care provider may recommend that you work with a diet and nutrition specialist (dietitian) to make a meal plan that is best for you. Your meal plan may vary depending on factors such as:   The calories you need.  The medicines you take.  Your weight.  Your blood glucose, blood pressure, and cholesterol levels.  Your activity level.  Other health conditions you have, such as heart or kidney disease. How do carbohydrates affect me? Carbohydrates, also called carbs, affect your blood glucose level more than any other type of food. Eating carbs naturally raises the amount of glucose in your blood. Carb counting is a method for keeping track of how many carbs you eat. Counting carbs is important to keep your blood glucose at a healthy level, especially if you use insulin or take certain oral diabetes medicines. It is important to know how many carbs you can safely have in each meal. This is different for every person. Your dietitian can help you calculate how many carbs you should have at each meal and for each snack. Foods that contain carbs include:  Bread, cereal, rice, pasta, and crackers.  Potatoes and corn.  Peas, beans, and lentils.  Milk and yogurt.  Fruit and juice.  Desserts, such as cakes, cookies, ice cream, and candy. How does alcohol affect me? Alcohol can cause a sudden decrease in blood glucose (hypoglycemia), especially if you use insulin or take certain oral diabetes medicines. Hypoglycemia can be a life-threatening condition. Symptoms of hypoglycemia (sleepiness, dizziness, and confusion) are similar to symptoms of having too much alcohol. If your health care provider says that alcohol is safe for you, follow these guidelines:  Limit alcohol intake to no more than 1 drink per day for nonpregnant women and 2 drinks per day for  men. One drink equals 12 oz of beer, 5 oz of wine, or 1 oz of hard liquor.  Do not drink on an empty stomach.  Keep yourself hydrated with water, diet soda, or unsweetened iced tea.  Keep in mind that regular soda, juice, and other mixers may contain a lot of sugar and must be counted as carbs. What are tips for following this  plan?  Reading food labels  Start by checking the serving size on the "Nutrition Facts" label of packaged foods and drinks. The amount of calories, carbs, fats, and other nutrients listed on the label is based on one serving of the item. Many items contain more than one serving per package.  Check the total grams (g) of carbs in one serving. You can calculate the number of servings of carbs in one serving by dividing the total carbs by 15. For example, if a food has 30 g of total carbs, it would be equal to 2 servings of carbs.  Check the number of grams (g) of saturated and trans fats in one serving. Choose foods that have low or no amount of these fats.  Check the number of milligrams (mg) of salt (sodium) in one serving. Most people should limit total sodium intake to less than 2,300 mg per day.  Always check the nutrition information of foods labeled as "low-fat" or "nonfat". These foods may be higher in added sugar or refined carbs and should be avoided.  Talk to your dietitian to identify your daily goals for nutrients listed on the label. Shopping  Avoid buying canned, premade, or processed foods. These foods tend to be high in fat, sodium, and added sugar.  Shop around the outside edge of the grocery store. This includes fresh fruits and vegetables, bulk grains, fresh meats, and fresh dairy. Cooking  Use low-heat cooking methods, such as baking, instead of high-heat cooking methods like deep frying.  Cook using healthy oils, such as olive, canola, or sunflower oil.  Avoid cooking with butter, cream, or high-fat meats. Meal planning  Eat meals and snacks regularly, preferably at the same times every day. Avoid going long periods of time without eating.  Eat foods high in fiber, such as fresh fruits, vegetables, beans, and whole grains. Talk to your dietitian about how many servings of carbs you can eat at each meal.  Eat 4-6 ounces (oz) of lean protein each day, such as lean  meat, chicken, fish, eggs, or tofu. One oz of lean protein is equal to: ? 1 oz of meat, chicken, or fish. ? 1 egg. ?  cup of tofu.  Eat some foods each day that contain healthy fats, such as avocado, nuts, seeds, and fish. Lifestyle  Check your blood glucose regularly.  Exercise regularly as told by your health care provider. This may include: ? 150 minutes of moderate-intensity or vigorous-intensity exercise each week. This could be brisk walking, biking, or water aerobics. ? Stretching and doing strength exercises, such as yoga or weightlifting, at least 2 times a week.  Take medicines as told by your health care provider.  Do not use any products that contain nicotine or tobacco, such as cigarettes and e-cigarettes. If you need help quitting, ask your health care provider.  Work with a Social worker or diabetes educator to identify strategies to manage stress and any emotional and social challenges. Questions to ask a health care provider  Do I need to meet with a diabetes educator?  Do I need to meet with  a dietitian?  What number can I call if I have questions?  When are the best times to check my blood glucose? Where to find more information:  American Diabetes Association: diabetes.org  Academy of Nutrition and Dietetics: www.eatright.[employer]  National Institute of Diabetes and Digestive and Kidney Diseases (NIH): CarFli[employer].tnwww.niddk.nih.gov Summary  A healthy meal plan will help you control your blood glucose and maintain a healthy lifestyle.  Working with a diet and nutrition specialist (dietitian) can help you make a meal plan that is best for you.  Keep in mind that carbohydrates (carbs) and alcohol have immediate effects on your blood glucose levels. It is important to count carbs and to use alcohol carefully. This information is not intended to replace advice given to you by your health care provider. Make sure you discuss any questions you have with your health care provider.  Document Released: 02/02/2005 Document Revised: 04/20/2017 Document Reviewed: 06/12/2016 Elsevier Patient Education  2020 [employer]

## 2019-04-01 ENCOUNTER — Other Ambulatory Visit: Payer: Self-pay

## 2019-04-01 ENCOUNTER — Encounter: Payer: Self-pay | Admitting: Internal Medicine

## 2019-04-01 ENCOUNTER — Ambulatory Visit: Payer: Medicare Other | Admitting: Internal Medicine

## 2019-04-01 VITALS — BP 114/80 | HR 70 | Ht 66.0 in | Wt 206.0 lb

## 2019-04-01 DIAGNOSIS — I5022 Chronic systolic (congestive) heart failure: Secondary | ICD-10-CM

## 2019-04-01 DIAGNOSIS — Z1211 Encounter for screening for malignant neoplasm of colon: Secondary | ICD-10-CM | POA: Diagnosis not present

## 2019-04-01 DIAGNOSIS — Z95810 Presence of automatic (implantable) cardiac defibrillator: Secondary | ICD-10-CM

## 2019-04-01 DIAGNOSIS — I2510 Atherosclerotic heart disease of native coronary artery without angina pectoris: Secondary | ICD-10-CM | POA: Diagnosis not present

## 2019-04-01 DIAGNOSIS — Z1212 Encounter for screening for malignant neoplasm of rectum: Secondary | ICD-10-CM | POA: Diagnosis not present

## 2019-04-01 LAB — COLOGUARD

## 2019-04-01 NOTE — Progress Notes (Signed)
HPI [name] returns today for followup. He is a pleasant 68 yo man with chronic systolic heart failure, s/p MI, EF 30%, obesity, and HTN. Hedenies chest pain, shortness of breath, or syncope. No edema. No ICD shock. He has been stable without hospitalization.  No Known Allergies   Current Outpatient Medications  Medication Sig Dispense Refill  . albuterol (VENTOLIN HFA) 108 (90 Base) MCG/ACT inhaler Inhale 2 puffs into the lungs every 6 (six) hours as needed for wheezing or shortness of breath. 18 g 1  . aspirin 81 MG tablet Take 81 mg by mouth daily.      [name] atorvastatin (LIPITOR) 40 MG tablet Take 1 tablet (40 mg total) by mouth daily. 90 tablet 2  . carvedilol (COREG) 3.125 MG tablet Take 1 tablet (3.125 mg total) by mouth 2 (two) times daily. KEEP OV. 180 tablet 0  . fluticasone (FLONASE) 50 MCG/ACT nasal spray Place 2 sprays into both nostrils daily. 16 g 6  . losartan (COZAAR) 25 MG tablet Take 1 tablet (25 mg total) by mouth daily. KEEP OV. 90 tablet 0  . Multiple Vitamin (MULTIVITAMIN) tablet Take 1 tablet by mouth daily.    . nitroGLYCERIN (NITROSTAT) 0.4 MG SL tablet Place 1 tablet (0.4 mg total) under the tongue every 5 (five) minutes as needed for chest pain (for chest pain). 25 tablet 6  . spironolactone (ALDACTONE) 25 MG tablet Take 0.5 tablets (12.5 mg total) by mouth daily. KEEP OV. 45 tablet 0   No current facility-administered medications for this visit.      Past Medical History:  Diagnosis Date  . Coronary artery disease   . Dyslipidemia   . Erectile dysfunction 08/18/2016  . History of acute anterior wall MI   . Hyperlipidemia   . ICD (implantable cardiac defibrillator) in place June 2012  . Ischemic cardiomyopathy   . Personal history of MRSA (methicillin resistant Staphylococcus aureus)   . Tobacco abuse   . Ventricular dysfunction 07/07/2010   Severe left ventricular dysfunction with ejection fraction of 30-35%    ROS:   All systems reviewed and  negative except as noted in the HPI.   Past Surgical History:  Procedure Laterality Date  . CARDIAC CATHETERIZATION  07/07/2010   DES to proximal LAD  . CARDIAC CATHETERIZATION  2004  . EP IMPLANTABLE DEVICE  June 2012     Family History  Problem Relation Age of Onset  . Hypertension Mother   . Bone cancer Mother   . Hyperlipidemia Father   . Bladder Cancer Father   . Diabetes Father      Social History   Socioeconomic History  . Marital status: Married    Spouse name: Not on file  . Number of children: Not on file  . Years of education: Not on file  . Highest education level: Not on file  Occupational History  . Not on file  Social Needs  . Financial resource strain: Not on file  . Food insecurity    Worry: Not on file    Inability: Not on file  . Transportation needs    Medical: Not on file    Non-medical: Not on file  Tobacco Use  . Smoking status: Former Smoker    Packs/day: 1.00    Years: 40.00    Pack years: 40.00    Types: Cigarettes    Start date: 74    Quit date: 2012    Years since quitting: 8.8  . Smokeless  tobacco: Never Used  Substance and Sexual Activity  . Alcohol use: No  . Drug use: No  . Sexual activity: Yes  Lifestyle  . Physical activity    Days per week: Not on file    Minutes per session: Not on file  . Stress: Not on file  Relationships  . Social Musician on phone: Not on file    Gets together: Not on file    Attends religious service: Not on file    Active member of club or organization: Not on file    Attends meetings of clubs or organizations: Not on file    Relationship status: Not on file  . Intimate partner violence    Fear of current or ex partner: Not on file    Emotionally abused: Not on file    Physically abused: Not on file    Forced sexual activity: Not on file  Other Topics Concern  . Not on file  Social History Narrative  . Not on file     BP 114/80   Pulse 70   Ht 5\' 6"  (1.676 m)   Wt  206 lb (93.4 kg)   SpO2 96%   BMI 33.25 kg/m   Physical Exam:  Well appearing obese man, NAD HEENT: Unremarkable Neck:  No JVD, no thyromegally Lymphatics:  No adenopathy Back:  No CVA tenderness Lungs:  Clear with no wheezes HEART:  Regular rate rhythm, no murmurs, no rubs, no clicks Abd:  soft, positive bowel sounds, no organomegally, no rebound, no guarding Ext:  2 plus pulses, no edema, no cyanosis, no clubbing Skin:  No rashes no nodules Neuro:  CN II through XII intact, motor grossly intact  DEVICE  Normal device function.  See PaceArt for details.   Assess/Plan: 1. Chronic systolic heart failure -he will continue his current medical therapy. 2. ICD - his [employer]. Jude single chamber ICD is working normally. No change. 3. Obesity - I discussed the long term importance of weight loss and increasing his activity.  .D.

## 2019-04-01 NOTE — Patient Instructions (Signed)

## 2019-04-02 ENCOUNTER — Ambulatory Visit: Payer: Medicare Other | Admitting: Cardiovascular Disease

## 2019-04-04 ENCOUNTER — Other Ambulatory Visit: Payer: Self-pay | Admitting: Cardiovascular Disease

## 2019-04-09 ENCOUNTER — Other Ambulatory Visit: Payer: Self-pay | Admitting: Cardiovascular Disease

## 2019-04-10 ENCOUNTER — Encounter: Payer: Self-pay | Admitting: Internal Medicine

## 2019-04-10 NOTE — Progress Notes (Signed)
Abstracted and sent to scan  

## 2019-05-08 ENCOUNTER — Ambulatory Visit: Payer: Medicare Other | Admitting: Cardiovascular Disease

## 2019-06-13 ENCOUNTER — Ambulatory Visit (INDEPENDENT_AMBULATORY_CARE_PROVIDER_SITE_OTHER): Payer: Medicare Other | Admitting: *Deleted

## 2019-06-13 DIAGNOSIS — I5022 Chronic systolic (congestive) heart failure: Secondary | ICD-10-CM

## 2019-06-13 LAB — CUP PACEART REMOTE DEVICE CHECK
Battery Remaining Longevity: 34 mo
Battery Remaining Percentage: 30 %
Battery Voltage: 2.86 V
Brady Statistic RV Percent Paced: 1 %
Date Time Interrogation Session: 20210122020034
HighPow Impedance: 93 Ohm
HighPow Impedance: 93 Ohm
Implantable Lead Implant Date: 20120621
Implantable Lead Location: 753860
Implantable Pulse Generator Implant Date: 20120621
Lead Channel Impedance Value: 830 Ohm
Lead Channel Pacing Threshold Amplitude: 1.25 V
Lead Channel Pacing Threshold Pulse Width: 0.5 ms
Lead Channel Sensing Intrinsic Amplitude: 12 mV
Lead Channel Setting Pacing Amplitude: 2.5 V
Lead Channel Setting Pacing Pulse Width: 0.5 ms
Lead Channel Setting Sensing Sensitivity: 0.5 mV
Pulse Gen Serial Number: 819170

## 2019-06-15 ENCOUNTER — Other Ambulatory Visit: Payer: Self-pay | Admitting: Cardiovascular Disease

## 2019-09-04 ENCOUNTER — Emergency Department (HOSPITAL_COMMUNITY): Payer: Medicare Other

## 2019-09-04 ENCOUNTER — Encounter (HOSPITAL_COMMUNITY): Payer: Self-pay

## 2019-09-04 ENCOUNTER — Telehealth: Payer: Self-pay | Admitting: *Deleted

## 2019-09-04 ENCOUNTER — Emergency Department (HOSPITAL_COMMUNITY)
Admission: EM | Admit: 2019-09-04 | Discharge: 2019-09-04 | Disposition: A | Discharge status: Home / Self Care | Payer: Medicare Other | Attending: Emergency Medicine | Admitting: Emergency Medicine

## 2019-09-04 ENCOUNTER — Other Ambulatory Visit: Payer: Self-pay

## 2019-09-04 DIAGNOSIS — I110 Hypertensive heart disease with heart failure: Secondary | ICD-10-CM | POA: Diagnosis not present

## 2019-09-04 DIAGNOSIS — I252 Old myocardial infarction: Secondary | ICD-10-CM | POA: Insufficient documentation

## 2019-09-04 DIAGNOSIS — N201 Calculus of ureter: Secondary | ICD-10-CM | POA: Insufficient documentation

## 2019-09-04 DIAGNOSIS — I5022 Chronic systolic (congestive) heart failure: Secondary | ICD-10-CM | POA: Diagnosis not present

## 2019-09-04 DIAGNOSIS — Z79899 Other long term (current) drug therapy: Secondary | ICD-10-CM | POA: Diagnosis not present

## 2019-09-04 DIAGNOSIS — Z87891 Personal history of nicotine dependence: Secondary | ICD-10-CM | POA: Diagnosis not present

## 2019-09-04 DIAGNOSIS — R109 Unspecified abdominal pain: Secondary | ICD-10-CM | POA: Diagnosis not present

## 2019-09-04 DIAGNOSIS — R1031 Right lower quadrant pain: Secondary | ICD-10-CM | POA: Diagnosis present

## 2019-09-04 DIAGNOSIS — I2510 Atherosclerotic heart disease of native coronary artery without angina pectoris: Secondary | ICD-10-CM | POA: Insufficient documentation

## 2019-09-04 DIAGNOSIS — Z7982 Long term (current) use of aspirin: Secondary | ICD-10-CM | POA: Diagnosis not present

## 2019-09-04 DIAGNOSIS — J449 Chronic obstructive pulmonary disease, unspecified: Secondary | ICD-10-CM | POA: Insufficient documentation

## 2019-09-04 DIAGNOSIS — Z95810 Presence of automatic (implantable) cardiac defibrillator: Secondary | ICD-10-CM | POA: Insufficient documentation

## 2019-09-04 LAB — URINALYSIS, ROUTINE W REFLEX MICROSCOPIC
Bilirubin Urine: NEGATIVE
Glucose, UA: NEGATIVE mg/dL
Ketones, ur: NEGATIVE mg/dL
Leukocytes,Ua: NEGATIVE
Nitrite: NEGATIVE
Protein, ur: 30 mg/dL — AB
Specific Gravity, Urine: 1.026 (ref 1.005–1.030)
pH: 5 (ref 5.0–8.0)

## 2019-09-04 LAB — COMPREHENSIVE METABOLIC PANEL
ALT: 18 U/L (ref 0–44)
AST: 28 U/L (ref 15–41)
Albumin: 3.8 g/dL (ref 3.5–5.0)
Alkaline Phosphatase: 71 U/L (ref 38–126)
Anion gap: 10 (ref 5–15)
BUN: 20 mg/dL (ref 8–23)
CO2: 25 mmol/L (ref 22–32)
Calcium: 9 mg/dL (ref 8.9–10.3)
Chloride: 102 mmol/L (ref 98–111)
Creatinine, Ser: 1.06 mg/dL (ref 0.61–1.24)
GFR calc Af Amer: >60 mL/min (ref >60)
GFR calc non Af Amer: >60 mL/min (ref >60)
Glucose, Bld: 181 mg/dL — ABNORMAL HIGH (ref 70–99)
Potassium: 3.9 mmol/L (ref 3.5–5.1)
Sodium: 137 mmol/L (ref 135–145)
Total Bilirubin: 1.2 mg/dL (ref 0.3–1.2)
Total Protein: 7.5 g/dL (ref 6.5–8.1)

## 2019-09-04 LAB — CBC
HCT: 45.5 % (ref 39.0–52.0)
Hemoglobin: 15 g/dL (ref 13.0–17.0)
MCH: 28.8 pg (ref 26.0–34.0)
MCHC: 33 g/dL (ref 30.0–36.0)
MCV: 87.5 fL (ref 80.0–100.0)
Platelets: 188 10*3/uL (ref 150–400)
RBC: 5.2 MIL/uL (ref 4.22–5.81)
RDW: 12.7 % (ref 11.5–15.5)
WBC: 7.2 10*3/uL (ref 4.0–10.5)
nRBC: 0 % (ref 0.0–0.2)

## 2019-09-04 LAB — LIPASE, BLOOD: Lipase: 25 U/L (ref 11–51)

## 2019-09-04 MED ORDER — HYDROCODONE-ACETAMINOPHEN 5-325 MG PO TABS: 2.0000 | ORAL_TABLET | ORAL | 0 refills | Status: DC | PRN

## 2019-09-04 MED ORDER — ONDANSETRON 4 MG PO TBDP: 4.0000 mg | ORAL_TABLET | Freq: Three times a day (TID) | ORAL | 0 refills | Status: AC | PRN

## 2019-09-04 NOTE — ED Provider Notes (Signed)
[facility] EMERGENCY DEPARTMENT Provider Note   CSN: [id number] Arrival date & time: 09/04/19  1194     History Chief Complaint  Patient presents with  . Abdominal Pain  . Emesis    Spencer Horn is a 69 y.o. male with a past medical history of CAD, hyperlipidemia, tobacco abuse presenting to the ED with a chief complaint of right lower quadrant and right flank pain for the past 3 hours.  Reports constant sharp pain with associated nausea and vomiting.  Reports several episodes of nonbloody, nonbilious emesis.  Since arrival to the ED states that his pain has significantly improved abruptly.  He no longer feels nauseous.  He has not tried medications for pain prior to arrival but did try some Pepto-Bismol to help with the nausea with only minimal improvement.  He last had a kidney stone over 10 years ago, believes that this feels similar.  Prior abdominal surgeries include appendectomy.  He denies any dysuria, hematuria, back pain, injuries or falls, diarrhea, constipation, suspicious food ingestions, fever.   HPI     Past Medical History:  Diagnosis Date  . Coronary artery disease   . Dyslipidemia   . Erectile dysfunction 08/18/2016  . History of acute anterior wall MI   . Hyperlipidemia   . ICD (implantable cardiac defibrillator) in place June 2012  . Ischemic cardiomyopathy   . Personal history of MRSA (methicillin resistant Staphylococcus aureus)   . Tobacco abuse   . Ventricular dysfunction 07/07/2010   Severe left ventricular dysfunction with ejection fraction of 30-35%    Patient Active Problem List   Diagnosis Date Noted  . Routine general medical examination at a health care facility 02/11/2019  . Mass of left ankle 12/13/2017  . COPD (chronic obstructive pulmonary disease) (HCC) 05/18/2017  . History of acute anterior wall MI   . Impaired fasting blood sugar 04/23/2017  . Erectile dysfunction 08/18/2016  . Mass of chest 06/27/2016  . Anxiety  12/30/2013  . Malaise and fatigue 12/29/2011  . Dual implantable cardioverter-defibrillator in situ 02/21/2011  . Chronic systolic heart failure (HCC) 10/30/2010  . Coronary artery disease   . Hyperlipidemia   . Hypertension   . Ventricular dysfunction 07/07/2010    Past Surgical History:  Procedure Laterality Date  . CARDIAC CATHETERIZATION  07/07/2010   DES to proximal LAD  . CARDIAC CATHETERIZATION  2004  . EP IMPLANTABLE DEVICE  June 2012       Family History  Problem Relation Age of Onset  . Hypertension Mother   . Bone cancer Mother   . Hyperlipidemia Father   . Bladder Cancer Father   . Diabetes Father     Social History   Tobacco Use  . Smoking status: Former Smoker    Packs/day: 1.00    Years: 40.00    Pack years: 40.00    Types: Cigarettes    Start date: 99    Quit date: 2012    Years since quitting: 9.2  . Smokeless tobacco: Never Used  Substance Use Topics  . Alcohol use: No  . Drug use: No    Home Medications Prior to Admission medications   Medication Sig Start Date End Date Taking? Authorizing Provider  albuterol (VENTOLIN HFA) 108 (90 Base) MCG/ACT inhaler Inhale 2 puffs into the lungs every 6 (six) hours as needed for wheezing or shortness of breath. 02/10/19   Myrlene Broker, MD  aspirin 81 MG tablet Take 81 mg by mouth daily.  [provider]  atorvastatin (LIPITOR) 40 MG tablet Take 1 tablet by mouth once daily 04/09/19   Marinus Maw, MD  carvedilol (COREG) 3.125 MG tablet Take 1 tablet by mouth twice daily 04/09/19   Marinus Maw, MD  fluticasone [facility]) 50 MCG/ACT nasal spray Place 2 sprays into both nostrils daily. 01/30/18   Myrlene Broker, MD  HYDROcodone-acetaminophen (NORCO/VICODIN) 5-325 MG tablet Take 2 tablets by mouth every 4 (four) hours as needed. 09/04/19   Janyth Riera, Hillary Bow, PA-C  losartan (COZAAR) 25 MG tablet Take 1 tablet by mouth once daily 06/16/19   Chilton Si, MD  Multiple Vitamin  (MULTIVITAMIN) tablet Take 1 tablet by mouth daily.    [provider]  nitroGLYCERIN (NITROSTAT) 0.4 MG SL tablet Place 1 tablet (0.4 mg total) under the tongue every 5 (five) minutes as needed for chest pain (for chest pain). 06/21/16   Chilton Si, MD  ondansetron (ZOFRAN ODT) 4 MG disintegrating tablet Take 1 tablet (4 mg total) by mouth every 8 (eight) hours as needed for nausea or vomiting. 09/04/19   Elliott Lasecki, PA-C  spironolactone (ALDACTONE) 25 MG tablet Take 0.5 tablets (12.5 mg total) by mouth daily. KEEP OV. 01/15/19   Chilton Si, MD    Allergies    Patient has no known allergies.  Review of Systems   Review of Systems  Constitutional: Negative for appetite change, chills and fever.  HENT: Negative for ear pain, rhinorrhea, sneezing and sore throat.   Eyes: Negative for photophobia and visual disturbance.  Respiratory: Negative for cough, chest tightness, shortness of breath and wheezing.   Cardiovascular: Negative for chest pain and palpitations.  Gastrointestinal: Negative for abdominal pain, blood in stool, constipation, diarrhea, nausea and vomiting.  Genitourinary: Positive for flank pain. Negative for dysuria, hematuria and urgency.  Musculoskeletal: Negative for myalgias.  Skin: Negative for rash.  Neurological: Negative for dizziness, weakness and light-headedness.    Physical Exam Updated Vital Signs BP 129/87   Pulse 70   Temp 97.9 F (36.6 C) (Oral)   Resp 16   Ht 5\' 6"  (1.676 m)   Wt 95.3 kg   SpO2 97%   BMI 33.89 kg/m   Physical Exam Vitals and nursing note reviewed.  Constitutional:      General: He is not in acute distress.    Appearance: He is well-developed.  HENT:     Head: Normocephalic and atraumatic.     Nose: Nose normal.  Eyes:     General: No scleral icterus.       Left eye: No discharge.     Conjunctiva/sclera: Conjunctivae normal.  Cardiovascular:     Rate and Rhythm: Normal rate and regular rhythm.      Heart sounds: Normal heart sounds. No murmur. No friction rub. No gallop.   Pulmonary:     Effort: Pulmonary effort is normal. No respiratory distress.     Breath sounds: Normal breath sounds.  Abdominal:     General: Bowel sounds are normal. There is no distension.     Palpations: Abdomen is soft.     Tenderness: There is no abdominal tenderness. There is no guarding.  Musculoskeletal:        General: Normal range of motion.     Cervical back: Normal range of motion and neck supple.  Skin:    General: Skin is warm and dry.     Findings: No rash.  Neurological:     Mental Status: He is alert.  Motor: No abnormal muscle tone.     Coordination: Coordination normal.     ED Results / Procedures / Treatments   Labs (all labs ordered are listed, but only abnormal results are displayed) Labs Reviewed  COMPREHENSIVE METABOLIC PANEL - Abnormal; Notable for the following components:      Result Value   Glucose, Bld 181 (*)    All other components within normal limits  URINALYSIS, ROUTINE W REFLEX MICROSCOPIC - Abnormal; Notable for the following components:   Color, Urine AMBER (*)    APPearance HAZY (*)    Hgb urine dipstick LARGE (*)    Protein, ur 30 (*)    Bacteria, UA RARE (*)    All other components within normal limits  URINE CULTURE  LIPASE, BLOOD  CBC    EKG None  Radiology CT Renal Stone Study  Result Date: 09/04/2019 CLINICAL DATA:  Flank pain kidney stone suspected, right flank pain. EXAM: CT ABDOMEN AND PELVIS WITHOUT CONTRAST TECHNIQUE: Multidetector CT imaging of the abdomen and pelvis was performed following the standard protocol without IV contrast. COMPARISON:  None FINDINGS: Lower chest: No consolidation. No pleural effusion. Heart size normal with single lead pacer device in situ. Hepatobiliary: No focal, suspicious hepatic lesion on noncontrast imaging. Cholelithiasis with narrowed mid gallbladder. No pericholecystic stranding. No ductal dilation.  Pancreas: Pancreas is normal without focal lesion or ductal dilation. No peripancreatic inflammation. Spleen: Spleen with granulomatous changes. Adrenals/Urinary Tract: Adrenal glands are normal. Mild perinephric and Peri ureteral stranding on the right with a distal right ureteral calculus just proximal to the right UVJ (image 76, series 3) 4 mm size. No hydronephrosis. 8 x 9 mm right lower pole calculus. 3 mm interpolar calculus on the right as well. No left renal calculi or ureteral dilute elation. Urinary bladder is normal. Stomach/Bowel: Stomach is under distended limiting assessment. No small bowel dilation. Appendix is not visualized. No secondary sign of acute appendicitis. Colonic diverticulosis and diverticular disease. Vascular/Lymphatic: Retroaortic left renal vein. Atherosclerotic calcifications of the abdominal aorta extending in the iliac vessels. No aneurysm. No adenopathy. No pelvic lymphadenopathy. Reproductive: Unremarkable. Other: No abdominal wall hernia or abnormality. No abdominopelvic ascites. Musculoskeletal: Spinal degenerative changes without acute or destructive bone finding. Pars defects at L5 with grade 2 anterolisthesis of L5 on S1. IMPRESSION: 1. Mild right perinephric and periureteral stranding with a 4 mm distal right ureteral calculus just proximal to the right UVJ. No hydronephrosis. 2. Additional nonobstructing right nephrolithiasis. 3. Cholelithiasis with narrowed mid gallbladder. No pericholecystic stranding. Findings may be related to prior cholecystitis in the setting of cholelithiasis. Suggest follow-up nonemergent ultrasound for further assessment. 4. Spinal degenerative changes with grade 2 anterolisthesis of L5 on S1 in the setting of pars defects. 5. Aortic atherosclerosis. Aortic Atherosclerosis (ICD10-I70.0). Electronically Signed   By: Donzetta Kohut M.D.   On: 09/04/2019 10:17    Procedures Procedures (including critical care time)  Medications Ordered in  ED Medications - No data to display  ED Course  I have reviewed the triage vital signs and the nursing notes.  Pertinent labs & imaging results that were available during my care of the patient were reviewed by me and considered in my medical decision making (see chart for details).    MDM Rules/Calculators/A&P                      69 year old male with a past medical history of CAD, hyperlipidemia, tobacco abuse presenting to the ED with a chief  complaint of right lower quadrant pain right flank pain for the past 3 hours.  Constant sharp pain since it began but did significantly improved since arrival to the ED.  Reports associated nausea and vomiting.  To Pepto-Bismol try to help with the nausea.  Had a kidney stone over 10 years ago and is concerned this could be similar.  He has had a prior appendectomy.  On my exam there is no tenderness palpation of the abdomen noted.  Abdomen is soft and nondistended.  He is afebrile without recent use of antipyretics.  Urinalysis with hemoglobin, rare bacteria, sent for culture.  CMP, CBC and lipase unremarkable.  CT renal stone study shows 4 mm distal stone just proximal to the UVJ on the right side.  Suspect that this is the cause of patient's symptoms.  Also incidental finding of cholelithiasis and possible prior cholecystitis.  No right upper quadrant pain today.  Patient's pain controlled here.  He declines any medications for pain or nausea during my evaluation.  We will have him follow-up with urology and return for worsening symptoms.  Patient is hemodynamically stable, in NAD, and able to ambulate in the ED. Evaluation does not show pathology that would require ongoing emergent intervention or inpatient treatment. I have personally reviewed and interpreted all lab work and imaging at today's ED visit. I explained the diagnosis to the patient. Pain has been managed and has no complaints prior to discharge. Patient is comfortable with above plan and is  stable for discharge at this time. All questions were answered prior to disposition. Strict return precautions for returning to the ED were discussed. Encouraged follow up with PCP.   Prior to providing a prescription for a controlled substance, I independently reviewed the patient's recent prescription history on the [facility]. The patient had no recent or regular prescriptions and was deemed appropriate for a brief, less than 3 day prescription of narcotic for acute analgesia.  An After Visit Summary was printed and given to the patient.   Portions of this note were generated with [employer]. Dictation errors may occur despite best attempts at proofreading.  Final Clinical Impression(s) / ED Diagnoses Final diagnoses:  Ureterolithiasis    Rx / DC Orders ED Discharge Orders         Ordered    HYDROcodone-acetaminophen (NORCO/VICODIN) 5-325 MG tablet  Every 4 hours PRN     09/04/19 1129    ondansetron (ZOFRAN ODT) 4 MG disintegrating tablet  Every 8 hours PRN     09/04/19 1129           Delia Heady, PA-C 09/04/19 1129    Virgel Manifold, MD 09/06/19 1003

## 2019-09-04 NOTE — Telephone Encounter (Signed)
Pharmacy called regarding diagnosis for pt prescribed vicodin.  EDCM reviewed the chart to find pt dx was gallstones.

## 2019-09-04 NOTE — Discharge Instructions (Addendum)
Take the medications as needed to help with your pain. Your CT scan done today shows a 4 mm stone that is almost passed. It also shows that you have some gallstones and prior inflammation of your gallbladder. Please inform your PCP about this for any additional imaging as needed. The labs regarding your liver and gallbladder are normal today. Follow-up with the urologist if your symptoms do not improve in 4 to 5 days. Return to the ER if you start to develop a fever, worsening pain, vomiting, chest pain or shortness of breath.

## 2019-09-04 NOTE — ED Notes (Signed)
Pt transported to CT via stretcher at this time.  

## 2019-09-04 NOTE — ED Triage Notes (Signed)
Pt reports RLQ pain since waking up this morning, pt also had a few episodes of emesis. Hx of kidney stones and states this feels the same. Pt a.o, no active vomiting in triage.

## 2019-09-05 LAB — URINE CULTURE: Culture: NO GROWTH

## 2019-09-10 ENCOUNTER — Telehealth: Payer: Self-pay | Admitting: Internal Medicine

## 2019-09-10 NOTE — Telephone Encounter (Signed)
I s/w pt and he has been made aware he will need to hold ASA x 7 days prior to dental procedure and resume as soon as dentist feels it is safe and hemostasis achieved. Pt thanked me for the call. I have faxed over to the dental office clearance with recommendations holding ASA x 7 days prior. I will remove from the pre op call back pool.

## 2019-09-10 NOTE — Telephone Encounter (Signed)
I placed a call into [facility] to confirm information that the pt called into our office. Woman at [facility] states pt has not been seen with them yet. I confirmed he was then coming in for a consult and she answered yes, but said she did not know when. I then called to s/w the pt to let him know that we needed to confirm the information he gave our office though dental office was not able to offer any help. Pt's wife (DPP) states the pt is having 6 teeth extracted on the bottom with then a full denture plate placed as well as he will be having a new replacement top denture plate done. Pt's wife states he is on ASA and wonders if he is going to need to hold the ASA. I explained to the wife that I will do my best to see if we can clear the pt. Pt's wife thanked me for the call and the help.  I believe fax number is [phone]

## 2019-09-10 NOTE — Telephone Encounter (Signed)
   Primary Cardiologist: Chilton Si, MD  Chart reviewed as part of pre-operative protocol coverage. Given past medical history and time since last visit, based on ACC/AHA guidelines, Spencer Horn would be at acceptable risk for the planned procedure without further cardiovascular testing.   His aspirin may be held for 7 days prior to his tooth extraction and restarted as soon hemostasis is achieved.  I will route this recommendation to the requesting party via Epic fax function and remove from pre-op pool.  Please call with questions.  Thomasene Ripple. Tene Gato NP-C     [facility] [address] Office [phone] Fax [phone]

## 2019-09-10 NOTE — Telephone Encounter (Signed)
   [facility] Pre-operative Risk Assessment    Request for surgical clearance:  1. What type of surgery is being performed? Multiple Tooth Extractions   2. When is this surgery scheduled? TBD based on clearance  3. What type of clearance is required (medical clearance vs. Pharmacy clearance to hold med vs. Both)?   4. Are there any medications that need to be held prior to surgery and how long? Up to Cardiology  5. Practice name and name of physician performing surgery? [facility]  6. What is your office phone number: [phone]   7.   What is your office fax number:   8.   Anesthesia type (None, local, MAC, general) ? Local/ novocain  Wife of the patient called. The dentist office needs to have clearance for the patient to have dental clearance. The wife was told she had to contact us. She has provided me with as much information as she could.      Johnna Acosta 09/10/2019, 10:47 AM  _________________________________________________________________   (provider comments below)

## 2019-09-10 NOTE — Telephone Encounter (Signed)
Call office, office closed for lunch between the hours of 11 am and 12:30 pm

## 2019-09-12 ENCOUNTER — Ambulatory Visit (INDEPENDENT_AMBULATORY_CARE_PROVIDER_SITE_OTHER): Payer: Medicare Other | Admitting: *Deleted

## 2019-09-12 DIAGNOSIS — I5022 Chronic systolic (congestive) heart failure: Secondary | ICD-10-CM

## 2019-09-12 LAB — CUP PACEART REMOTE DEVICE CHECK
Battery Remaining Longevity: 30 mo
Battery Remaining Percentage: 27 %
Battery Voltage: 2.83 V
Brady Statistic RV Percent Paced: 1 %
Date Time Interrogation Session: 20210423031934
HighPow Impedance: 89 Ohm
HighPow Impedance: 89 Ohm
Implantable Lead Implant Date: 20120621
Implantable Lead Location: 753860
Implantable Pulse Generator Implant Date: 20120621
Lead Channel Impedance Value: 860 Ohm
Lead Channel Pacing Threshold Amplitude: 1.25 V
Lead Channel Pacing Threshold Pulse Width: 0.5 ms
Lead Channel Sensing Intrinsic Amplitude: 12 mV
Lead Channel Setting Pacing Amplitude: 2.5 V
Lead Channel Setting Pacing Pulse Width: 0.5 ms
Lead Channel Setting Sensing Sensitivity: 0.5 mV
Pulse Gen Serial Number: 819170

## 2019-09-12 NOTE — Progress Notes (Signed)
ICD Remote  

## 2019-09-13 ENCOUNTER — Other Ambulatory Visit: Payer: Self-pay | Admitting: Cardiovascular Disease

## 2019-09-23 ENCOUNTER — Other Ambulatory Visit: Payer: Self-pay | Admitting: Cardiovascular Disease

## 2019-12-12 LAB — CUP PACEART REMOTE DEVICE CHECK
Battery Remaining Longevity: 30 mo
Battery Remaining Percentage: 26 %
Battery Voltage: 2.83 V
Brady Statistic RV Percent Paced: 1 %
Date Time Interrogation Session: 20210723034344
HighPow Impedance: 87 Ohm
HighPow Impedance: 87 Ohm
Implantable Lead Implant Date: 20120621
Implantable Lead Location: 753860
Implantable Pulse Generator Implant Date: 20120621
Lead Channel Impedance Value: 890 Ohm
Lead Channel Pacing Threshold Amplitude: 1.25 V
Lead Channel Pacing Threshold Pulse Width: 0.5 ms
Lead Channel Sensing Intrinsic Amplitude: 12 mV
Lead Channel Setting Pacing Amplitude: 2.5 V
Lead Channel Setting Pacing Pulse Width: 0.5 ms
Lead Channel Setting Sensing Sensitivity: 0.5 mV
Pulse Gen Serial Number: 819170

## 2019-12-15 ENCOUNTER — Other Ambulatory Visit: Payer: Self-pay | Admitting: Cardiovascular Disease

## 2019-12-18 ENCOUNTER — Encounter: Payer: Self-pay | Admitting: Cardiovascular Disease

## 2019-12-18 ENCOUNTER — Ambulatory Visit (INDEPENDENT_AMBULATORY_CARE_PROVIDER_SITE_OTHER): Payer: Medicare Other | Admitting: Cardiovascular Disease

## 2019-12-18 ENCOUNTER — Other Ambulatory Visit: Payer: Self-pay

## 2019-12-18 VITALS — BP 128/76 | HR 78 | Ht 66.0 in | Wt 213.0 lb

## 2019-12-18 DIAGNOSIS — I10 Essential (primary) hypertension: Secondary | ICD-10-CM | POA: Diagnosis not present

## 2019-12-18 DIAGNOSIS — R0602 Shortness of breath: Secondary | ICD-10-CM | POA: Diagnosis not present

## 2019-12-18 DIAGNOSIS — R5382 Chronic fatigue, unspecified: Secondary | ICD-10-CM

## 2019-12-18 DIAGNOSIS — I5022 Chronic systolic (congestive) heart failure: Secondary | ICD-10-CM

## 2019-12-18 DIAGNOSIS — R5383 Other fatigue: Secondary | ICD-10-CM | POA: Diagnosis not present

## 2019-12-18 DIAGNOSIS — E7800 Pure hypercholesterolemia, unspecified: Secondary | ICD-10-CM | POA: Diagnosis not present

## 2019-12-18 DIAGNOSIS — I2510 Atherosclerotic heart disease of native coronary artery without angina pectoris: Secondary | ICD-10-CM | POA: Diagnosis not present

## 2019-12-18 DIAGNOSIS — J42 Unspecified chronic bronchitis: Secondary | ICD-10-CM

## 2019-12-18 HISTORY — DX: Other fatigue: R53.83

## 2019-12-18 LAB — CBC WITH DIFFERENTIAL/PLATELET
Basophils Absolute: 0 10*3/uL (ref 0.0–0.2)
Basos: 1 %
EOS (ABSOLUTE): 0.4 10*3/uL (ref 0.0–0.4)
Eos: 6 %
Hematocrit: 45.6 % (ref 37.5–51.0)
Hemoglobin: 15.6 g/dL (ref 13.0–17.7)
Immature Grans (Abs): 0 10*3/uL (ref 0.0–0.1)
Immature Granulocytes: 0 %
Lymphocytes Absolute: 2.3 10*3/uL (ref 0.7–3.1)
Lymphs: 38 %
MCH: 29.3 pg (ref 26.6–33.0)
MCHC: 34.2 g/dL (ref 31.5–35.7)
MCV: 86 fL (ref 79–97)
Monocytes Absolute: 0.5 10*3/uL (ref 0.1–0.9)
Monocytes: 7 %
Neutrophils Absolute: 3 10*3/uL (ref 1.4–7.0)
Neutrophils: 48 %
Platelets: 185 10*3/uL (ref 150–450)
RBC: 5.33 x10E6/uL (ref 4.14–5.80)
RDW: 12.4 % (ref 11.6–15.4)
WBC: 6.2 10*3/uL (ref 3.4–10.8)

## 2019-12-18 LAB — COMPREHENSIVE METABOLIC PANEL
ALT: 14 IU/L (ref 0–44)
AST: 21 IU/L (ref 0–40)
Albumin/Globulin Ratio: 1.6 (ref 1.2–2.2)
Albumin: 4.3 g/dL (ref 3.8–4.8)
Alkaline Phosphatase: 84 IU/L (ref 48–121)
BUN/Creatinine Ratio: 15 (ref 10–24)
BUN: 14 mg/dL (ref 8–27)
Bilirubin Total: 0.6 mg/dL (ref 0.0–1.2)
CO2: 22 mmol/L (ref 20–29)
Calcium: 9.2 mg/dL (ref 8.6–10.2)
Chloride: 99 mmol/L (ref 96–106)
Creatinine, Ser: 0.96 mg/dL (ref 0.76–1.27)
GFR calc Af Amer: 94 mL/min/{1.73_m2} (ref >59)
GFR calc non Af Amer: 81 mL/min/{1.73_m2} (ref >59)
Globulin, Total: 2.7 g/dL (ref 1.5–4.5)
Glucose: 149 mg/dL — ABNORMAL HIGH (ref 65–99)
Potassium: 4.7 mmol/L (ref 3.5–5.2)
Sodium: 137 mmol/L (ref 134–144)
Total Protein: 7 g/dL (ref 6.0–8.5)

## 2019-12-18 LAB — T4, FREE: Free T4: 1.29 ng/dL (ref 0.82–1.77)

## 2019-12-18 LAB — LIPID PANEL
Chol/HDL Ratio: 3.1 ratio (ref 0.0–5.0)
Cholesterol, Total: 116 mg/dL (ref 100–199)
HDL: 38 mg/dL — ABNORMAL LOW (ref >39)
LDL Chol Calc (NIH): 48 mg/dL (ref 0–99)
Triglycerides: 180 mg/dL — ABNORMAL HIGH (ref 0–149)
VLDL Cholesterol Cal: 30 mg/dL (ref 5–40)

## 2019-12-18 LAB — TSH: TSH: 1.09 u[IU]/mL (ref 0.450–4.500)

## 2019-12-18 NOTE — Patient Instructions (Signed)
Medication Instructions:  Your physician recommends that you continue on your current medications as directed. Please refer to the Current Medication list given to you today.  *If you need a refill on your cardiac medications before your next appointment, please call your pharmacy*  Lab Work: LP/CMET/CBC/TSH/FT4 TODAY   If you have labs (blood work) drawn today and your tests are completely normal, you will receive your results only by: [name] MyChart Message (if you have MyChart) OR . A paper copy in the mail If you have any lab test that is abnormal or we need to change your treatment, we will call you to review the results.  Testing/Procedures: Your physician has requested that you have an echocardiogram. Echocardiography is a painless test that uses sound waves to create images of your heart. It provides your doctor with information about the size and shape of your heart and how well your heart's chambers and valves are working. This procedure takes approximately one hour. There are no restrictions for this procedure.  Your physician has requested that you have a lexiscan myoview. For further information please visit [email or web address] Please follow instruction sheet, as given.  OK FOR BOTH TO BE AT [address] OFFICE   Follow-Up: At [facility], you and your health needs are our priority.  As part of our continuing mission to provide you with exceptional heart care, we have created designated Provider Care Teams.  These Care Teams include your primary Cardiologist (physician) and Advanced Practice Providers (APPs -  Physician Assistants and Nurse Practitioners) who all work together to provide you with the care you need, when you need it.  We recommend signing up for the patient portal called "MyChart".  Sign up information is provided on this After Visit Summary.  MyChart is used to connect with patients for Virtual Visits (Telemedicine).  Patients are able to view lab/test results,  encounter notes, upcoming appointments, etc.  Non-urgent messages can be sent to your provider as well.   To learn more about what you can do with MyChart, go to [email or web address].    Your next appointment:   3 month(s)  The format for your next appointment:   In Person  Provider:   You may see Chilton Si, MD or one of the following Advanced Practice Providers on your designated Care Team:    Corine Shelter, PA-C  [place], [place]  [name], [place]

## 2019-12-18 NOTE — Progress Notes (Signed)
Cardiology Office Note   Date:  12/18/2019   ID:  DRESDEN LOZITO, DOB 04-17-1951, MRN [id number]  PCP:  Myrlene Broker, MD  Cardiologist:   Chilton Si, MD  Electrophysiologist: Lewayne Bunting, MD  No chief complaint on file.    History of Present Illness: Spencer Horn is a 69 y.o. male with CAD s/p MI, hypertension, chronic systolic and diastolic heart failure s/p ICD, erectile dysfunction, COPD, and hyperlipidemia who presents for follow up.  Spencer Horn was previously a patient of Dr. [name].  He had an MI in 2004 and 06/2010.  The second MI was treated with DES to the LAD.  He then had LVEF 35% and subsequently underwent implantation of an ICD.  He continues to complain of fatigue.  Blood counts, thyroid function, vitamin D and testosterone levels were all within normal limits.  Spencer Horn has struggled with exertional dyspnea.  His wife notes that he snores loudly but he has been persistently unwilling to get a sleep study.  He reports that he would not wear CPAP and therefore does not want to waste money or time on a test.  He was referred for PFTs that showed severe COPD and emphysema.  Entresto was switched back to losartan due to cost and the fact that his symptoms hadn't improved.  He was referred to Dr. [name] and was started on Spiriva.  The Spiriva was not helpful so he stopped using it.  He does use the albuterol rescue inhaler as needed.  He has been unable to afford most inhalers.  He reports that they make just a little too much to qualify for patient assistance programs.   Lately Spencer Horn has noticed that he is getting more tired. He rids on his riding mower and paints while sitting down.  He is able to do these activities but the following day he feels very fatigued.  He does not have any chest pain and his breathing is stable.  His wife reports that his breathing is worse.  She can sometimes hear him from across the room.  When this occurs she encourages him to take some  albuterol, which does seem to help.  He does many of the ADLs around the house like laundry, vacuuming, and cooking.  However he does not get much formal exercise.  He has not had any lower extremity edema, orthopnea, or PND.  She notes that he drinks about a gallon of water daily.  He does not check his blood pressure regularly at home.  She notes that his snoring has been worse lately.  He has consistently refused a sleep study and thinks that he would not be able to sleep with the CPAP machine.  He notes that he likes to eat and that his diet has been poor.  He reports that he is switched to sea salt and his wife notes that "this is still salt."   Past Medical History:  Diagnosis Date  . Coronary artery disease   . Dyslipidemia   . Erectile dysfunction 08/18/2016  . Fatigue 12/18/2019  . History of acute anterior wall MI   . Hyperlipidemia   . ICD (implantable cardiac defibrillator) in place June 2012  . Ischemic cardiomyopathy   . Personal history of MRSA (methicillin resistant Staphylococcus aureus)   . Tobacco abuse   . Ventricular dysfunction 07/07/2010   Severe left ventricular dysfunction with ejection fraction of 30-35%    Past Surgical History:  Procedure Laterality Date  .  CARDIAC CATHETERIZATION  07/07/2010   DES to proximal LAD  . CARDIAC CATHETERIZATION  2004  . EP IMPLANTABLE DEVICE  June 2012     Current Outpatient Medications  Medication Sig Dispense Refill  . albuterol (VENTOLIN HFA) 108 (90 Base) MCG/ACT inhaler Inhale 2 puffs into the lungs every 6 (six) hours as needed for wheezing or shortness of breath. 18 g 1  . aspirin 81 MG tablet Take 81 mg by mouth daily.      [name] atorvastatin (LIPITOR) 40 MG tablet Take 1 tablet by mouth once daily 90 tablet 2  . carvedilol (COREG) 3.125 MG tablet Take 1 tablet by mouth twice daily 180 tablet 2  . fluticasone (FLONASE) 50 MCG/ACT nasal spray Place 2 sprays into both nostrils daily. 16 g 6  . losartan (COZAAR) 25 MG tablet  Take 1 tablet by mouth once daily 90 tablet 2  . Multiple Vitamin (MULTIVITAMIN) tablet Take 1 tablet by mouth daily.    . nitroGLYCERIN (NITROSTAT) 0.4 MG SL tablet Place 1 tablet (0.4 mg total) under the tongue every 5 (five) minutes as needed for chest pain (for chest pain). 25 tablet 6  . ondansetron (ZOFRAN ODT) 4 MG disintegrating tablet Take 1 tablet (4 mg total) by mouth every 8 (eight) hours as needed for nausea or vomiting. 3 tablet 0  . spironolactone (ALDACTONE) 25 MG tablet TAKE 1/2 (ONE-HALF) TABLET BY MOUTH ONCE DAILY . (KEEP OFFICE VISIT) 45 tablet 2   No current facility-administered medications for this visit.    Allergies:   Patient has no known allergies.    Social History:  The patient  reports that he quit smoking about 9 years ago. His smoking use included cigarettes. He started smoking about 49 years ago. He has a 40.00 pack-year smoking history. He has never used smokeless tobacco. He reports that he does not drink alcohol and does not use drugs.   Family History:  The patient's family history includes Bladder Cancer in his father; Bone cancer in his mother; Diabetes in his father; Hyperlipidemia in his father; Hypertension in his mother.    ROS:  Please see the history of present illness.   Otherwise, review of systems are positive for fatigue, diaphoresis.   All other systems are reviewed and negative.    PHYSICAL EXAM: VS:  BP 128/76   Pulse 78   Ht 5\' 6"  (1.676 m)   Wt (!) 213 lb (96.6 kg)   SpO2 96%   BMI 34.38 kg/m  , BMI Body mass index is 34.38 kg/m. GENERAL:  Well appearing HEENT: Pupils equal round and reactive, fundi not visualized, oral mucosa unremarkable NECK:  No jugular venous distention, waveform within normal limits, carotid upstroke brisk and symmetric, no bruits LUNGS:  Clear to auscultation bilaterally HEART:  RRR.  PMI not displaced or sustained,S1 and S2 within normal limits, no S3, no S4, no clicks, no rubs, no murmurs ABD:  Flat,  positive bowel sounds normal in frequency in pitch, no bruits, no rebound, no guarding, no midline pulsatile mass, no hepatomegaly, no splenomegaly EXT:  2 plus pulses throughout, no edema, no cyanosis no clubbing SKIN:  No rashes no nodules NEURO:  Cranial nerves II through XII grossly intact, motor grossly intact throughout PSYCH:  Cognitively intact, oriented to person place and time  EKG:  EKG is ordered today. The ekg ordered 10/12/15 demonstrates sinus rhythm rate 68 bpm.  Low voltage limb leads and precordial leads.  Prior anteroseptal infarct.   08/16/16: sinus  rhythm.  Rate 76 bpm.  Prior anteroseptal infarct.  Lateral TWI.  04/17/17: Sinus rhythm.  Rate 74 bpm.  Prior septal infarct.   Sinus rhythm.  Rate 66 bpm.  Prior septal infarct.  Anterolateral T wave inversions unchanged from prior. 12/18/2019: Sinus rhythm.  Rate 78 bpm.  Prior anterior infarct.  Lateral T wave inversions.  Echo 06/18/15: Study Conclusions  - Left ventricle: LVEF is severely depressed at approximately 35 to  40% with severe hypokinesis of the mid/distal lateral, mid/distal  septal, distal inferior, apical wals. The cavity size was normal. - Ventricular septum: The contour showed systolic flattening.   Recent Labs: 09/04/2019: ALT 18; BUN 20; Creatinine, Ser 1.06; Hemoglobin 15.0; Platelets 188; Potassium 3.9; Sodium 137    Lipid Panel    Component Value Date/Time   CHOL 110 01/13/2019 1008   TRIG 125 01/13/2019 1008   HDL 35 (L) 01/13/2019 1008   CHOLHDL 3.1 01/13/2019 1008   CHOLHDL 3.3 04/11/2016 1035   VLDL 24 04/11/2016 1035   LDLCALC 50 01/13/2019 1008       Wt Readings from Last 3 Encounters:  12/18/19 (!) 213 lb (96.6 kg)  09/04/19 210 lb (95.3 kg)  04/01/19 206 lb (93.4 kg)      ASSESSMENT AND PLAN:  # Chronic systolic and diastolic heart failure:   Spencer Horn does not have any heart failure symptoms and is euvolemic on exam.  I suspect that his LVEF is stable to improved.  He is  tolerating a gallon of water and is not volume overloaded.  However he does report fatigue after exertion.  We will get an echocardiogram to reassess his systolic function.  Continue carvedilol, losartan, and spironolactone.  # CAD s/p MI: Stable.  No angina.  He has fatigue after exertion as above.  We will get a Lexiscan Myoview to assess for ischemia.  Continue aspirin, carvedilol, and atorvastatin.  Check lipids and CMP today.  # Hypertension: BP well-controlled.  Continue carvedilol and losartan 25mg  daily.   # Fatigue: # Snorning, daytime somnolence: Spencer Horn has fatigue after exertion.  I suspect this is more of a systemic issue as he has no symptoms while actively exerting himself.  We also discussed the fact that he is doing activities of daily living but not truly getting any exercise.  I suspect this and obesity are contributing significantly.  He also very likely has sleep apnea based on the reports from him and his wife.  He still reluctant to get a sleep study but will consider it if the above testing is negative.  I suspect his pulmonary disease is also contributing, but he has been unable to afford preventative inhalers.  Check TSH and CBC.  Current medicines are reviewed at length with the patient today.  The patient does not have concerns regarding medicines.  The following changes have been made: none  Labs/ tests ordered today include:   Orders Placed This Encounter  Procedures  . CBC with Differential/Platelet  . T4, free  . TSH  . Lipid panel  . Comprehensive metabolic panel  . MYOCARDIAL PERFUSION IMAGING  . EKG 12-Lead  . ECHOCARDIOGRAM COMPLETE     Disposition:   FU with Jennilyn Esteve C. Rosalia Hammers, MD, [facility] in 3 months.    Signed, Damoni Erker C. [facility], MD, [facility]  12/18/2019 9:44 AM    [facility]

## 2019-12-30 ENCOUNTER — Encounter (HOSPITAL_COMMUNITY): Payer: Self-pay | Admitting: *Deleted

## 2019-12-30 ENCOUNTER — Telehealth (HOSPITAL_COMMUNITY): Payer: Self-pay | Admitting: *Deleted

## 2019-12-30 NOTE — Telephone Encounter (Signed)
Attempted to leave a message on voicemail in reference to upcoming appointment scheduled for 01/05/2020 but no answer or voicemail available Spencer Horn, [name] Mychart letter sent with instructions.

## 2020-01-02 ENCOUNTER — Other Ambulatory Visit: Payer: Self-pay

## 2020-01-02 ENCOUNTER — Ambulatory Visit (HOSPITAL_COMMUNITY): Payer: Medicare Other | Attending: Cardiology

## 2020-01-02 DIAGNOSIS — R5383 Other fatigue: Secondary | ICD-10-CM | POA: Diagnosis not present

## 2020-01-02 DIAGNOSIS — R0602 Shortness of breath: Secondary | ICD-10-CM

## 2020-01-02 DIAGNOSIS — E7800 Pure hypercholesterolemia, unspecified: Secondary | ICD-10-CM

## 2020-01-02 DIAGNOSIS — I2510 Atherosclerotic heart disease of native coronary artery without angina pectoris: Secondary | ICD-10-CM

## 2020-01-02 DIAGNOSIS — I10 Essential (primary) hypertension: Secondary | ICD-10-CM | POA: Diagnosis not present

## 2020-01-02 LAB — ECHOCARDIOGRAM COMPLETE
Area-P 1/2: 4.31 cm2
S' Lateral: 5.1 cm

## 2020-01-02 MED ORDER — PERFLUTREN LIPID MICROSPHERE
1.0000 mL | INTRAVENOUS | Status: AC | PRN
  Administered 2020-01-02: 1 mL via INTRAVENOUS

## 2020-01-05 ENCOUNTER — Ambulatory Visit (HOSPITAL_COMMUNITY): Payer: Medicare Other | Attending: Cardiology

## 2020-01-05 ENCOUNTER — Other Ambulatory Visit: Payer: Self-pay

## 2020-01-05 DIAGNOSIS — I10 Essential (primary) hypertension: Secondary | ICD-10-CM

## 2020-01-05 DIAGNOSIS — I2510 Atherosclerotic heart disease of native coronary artery without angina pectoris: Secondary | ICD-10-CM

## 2020-01-05 DIAGNOSIS — R5383 Other fatigue: Secondary | ICD-10-CM | POA: Diagnosis not present

## 2020-01-05 DIAGNOSIS — E7800 Pure hypercholesterolemia, unspecified: Secondary | ICD-10-CM | POA: Diagnosis not present

## 2020-01-05 DIAGNOSIS — R0602 Shortness of breath: Secondary | ICD-10-CM | POA: Diagnosis not present

## 2020-01-05 LAB — MYOCARDIAL PERFUSION IMAGING
LV dias vol: 138 mL (ref 62–150)
LV sys vol: 96 mL
Peak HR: 100 {beats}/min
Rest HR: 75 {beats}/min
SDS: 4
SRS: 7
SSS: 13
TID: 0.93

## 2020-01-05 MED ORDER — TECHNETIUM TC 99M TETROFOSMIN IV KIT
10.5000 | PACK | Freq: Once | INTRAVENOUS | Status: AC | PRN
  Administered 2020-01-05: 10.5 via INTRAVENOUS
  Filled 2020-01-05: qty 11

## 2020-01-05 MED ORDER — TECHNETIUM TC 99M TETROFOSMIN IV KIT
32.3000 | PACK | Freq: Once | INTRAVENOUS | Status: AC | PRN
  Administered 2020-01-05: 32.3 via INTRAVENOUS
  Filled 2020-01-05: qty 33

## 2020-01-05 MED ORDER — REGADENOSON 0.4 MG/5ML IV SOLN
0.4000 mg | Freq: Once | INTRAVENOUS | Status: AC
  Administered 2020-01-05: 0.4 mg via INTRAVENOUS

## 2020-01-12 ENCOUNTER — Other Ambulatory Visit: Payer: Self-pay | Admitting: Internal Medicine

## 2020-01-27 ENCOUNTER — Other Ambulatory Visit: Payer: Self-pay

## 2020-01-27 ENCOUNTER — Encounter: Payer: Self-pay | Admitting: Cardiovascular Disease

## 2020-01-27 ENCOUNTER — Ambulatory Visit (INDEPENDENT_AMBULATORY_CARE_PROVIDER_SITE_OTHER): Payer: Medicare Other | Admitting: Cardiovascular Disease

## 2020-01-27 VITALS — BP 120/82 | HR 51 | Ht 66.0 in | Wt 211.4 lb

## 2020-01-27 DIAGNOSIS — J42 Unspecified chronic bronchitis: Secondary | ICD-10-CM

## 2020-01-27 DIAGNOSIS — I5042 Chronic combined systolic (congestive) and diastolic (congestive) heart failure: Secondary | ICD-10-CM | POA: Insufficient documentation

## 2020-01-27 DIAGNOSIS — I2510 Atherosclerotic heart disease of native coronary artery without angina pectoris: Secondary | ICD-10-CM

## 2020-01-27 DIAGNOSIS — Z5181 Encounter for therapeutic drug level monitoring: Secondary | ICD-10-CM

## 2020-01-27 DIAGNOSIS — Z95810 Presence of automatic (implantable) cardiac defibrillator: Secondary | ICD-10-CM

## 2020-01-27 DIAGNOSIS — I5043 Acute on chronic combined systolic (congestive) and diastolic (congestive) heart failure: Secondary | ICD-10-CM | POA: Diagnosis not present

## 2020-01-27 HISTORY — DX: Acute on chronic combined systolic (congestive) and diastolic (congestive) heart failure: I50.43

## 2020-01-27 HISTORY — DX: Chronic combined systolic (congestive) and diastolic (congestive) heart failure: I50.42

## 2020-01-27 MED ORDER — SACUBITRIL-VALSARTAN 24-26 MG PO TABS: 1.0000 | ORAL_TABLET | Freq: Two times a day (BID) | ORAL | 5 refills | Status: DC

## 2020-01-27 NOTE — Progress Notes (Signed)
Cardiology Office Note   Date:  01/27/2020   ID:  Spencer Horn, DOB 1950/08/07, MRN [id number]  PCP:  Myrlene Broker, MD  Cardiologist:   Chilton Si, MD  Electrophysiologist: Lewayne Bunting, MD  No chief complaint on file.    History of Present Illness: Spencer Horn is a 69 y.o. male with CAD s/p MI, hypertension, chronic systolic and diastolic heart failure s/p ICD, erectile dysfunction, COPD, and hyperlipidemia who presents for follow up.  [name] was previously a patient of Dr. [name].  He had an MI in 2004 and 06/2010.  The second MI was treated with DES to the LAD.  He then had LVEF 35% and subsequently underwent implantation of an ICD.  He continues to complain of fatigue.  Blood counts, thyroid function, vitamin D and testosterone levels were all within normal limits.  [name] has struggled with exertional dyspnea.  His wife notes that he snores loudly but he has been persistently unwilling to get a sleep study.  He reports that he would not wear CPAP and therefore does not want to waste money or time on a test.  He was referred for PFTs that showed severe COPD and emphysema.  Entresto was switched back to losartan due to cost and the fact that his symptoms hadn't improved.  He was referred to Dr. Marchelle Horn and was started on Spiriva.  The Spiriva was not helpful so he stopped using it.  He does use the albuterol rescue inhaler as needed.  He has been unable to afford most inhalers.  He reports that they make just a little too much to qualify for patient assistance programs.   At his last appointment [name] complained of increasing fatigue and exertional dyspnea.  He was referred for an echo 12/2019 that revealed LVEF 15 to 20%.  There is global hypoki at his last nesis with apical akinesis.  LV was moderately dilated.  He had grade 1 diastolic dysfunction and normal right ventricular function.  There is no significant valvular abnormality.  He had a Lexiscan Myoview at the  same time that revealed LVEF 30% with prior apical infarct and no ischemia.  He presents today to follow-up on his results.  Overall he has been feeling fairly well.  He has no lower extremity edema, orthopnea, or PND.  He has no chest pain or pressure.  He continues to be and using a CPAP machine.  At his last appointment we discussed limiting his fluid intake.  He continues to drink a gallon of water daily.   Past Medical History:  Diagnosis Date  . Acute on chronic combined systolic and diastolic CHF (congestive heart failure) (HCC) 01/27/2020  . Coronary artery disease   . Dyslipidemia   . Erectile dysfunction 08/18/2016  . Fatigue 12/18/2019  . History of acute anterior wall MI   . Hyperlipidemia   . ICD (implantable cardiac defibrillator) in place June 2012  . Ischemic cardiomyopathy   . Personal history of MRSA (methicillin resistant Staphylococcus aureus)   . Tobacco abuse   . Ventricular dysfunction 07/07/2010   Severe left ventricular dysfunction with ejection fraction of 30-35%    Past Surgical History:  Procedure Laterality Date  . CARDIAC CATHETERIZATION  07/07/2010   DES to proximal LAD  . CARDIAC CATHETERIZATION  2004  . EP IMPLANTABLE DEVICE  June 2012     Current Outpatient Medications  Medication Sig Dispense Refill  . albuterol (VENTOLIN HFA) 108 (90 Base) MCG/ACT inhaler  Inhale 2 puffs into the lungs every 6 (six) hours as needed for wheezing or shortness of breath. 18 g 1  . aspirin 81 MG tablet Take 81 mg by mouth daily.      [name]. atorvastatin (LIPITOR) 40 MG tablet Take 1 tablet by mouth once daily 90 tablet 2  . carvedilol (COREG) 3.125 MG tablet Take 1 tablet by mouth twice daily 180 tablet 3  . fluticasone (FLONASE) 50 MCG/ACT nasal spray Place 2 sprays into both nostrils daily. 16 g 6  . Multiple Vitamin (MULTIVITAMIN) tablet Take 1 tablet by mouth daily.    . nitroGLYCERIN (NITROSTAT) 0.4 MG SL tablet Place 1 tablet (0.4 mg total) under the tongue every 5  (five) minutes as needed for chest pain (for chest pain). 25 tablet 6  . ondansetron (ZOFRAN ODT) 4 MG disintegrating tablet Take 1 tablet (4 mg total) by mouth every 8 (eight) hours as needed for nausea or vomiting. 3 tablet 0  . spironolactone (ALDACTONE) 25 MG tablet TAKE 1/2 (ONE-HALF) TABLET BY MOUTH ONCE DAILY . (KEEP OFFICE VISIT) 45 tablet 2  . sacubitril-valsartan (ENTRESTO) 24-26 MG Take 1 tablet by mouth 2 (two) times daily. 60 tablet 5   No current facility-administered medications for this visit.    Allergies:   Patient has no known allergies.    Social History:  The patient  reports that he quit smoking about 9 years ago. His smoking use included cigarettes. He started smoking about 49 years ago. He has a 40.00 pack-year smoking history. He has never used smokeless tobacco. He reports that he does not drink alcohol and does not use drugs.   Family History:  The patient's family history includes Bladder Cancer in his father; Bone cancer in his mother; Diabetes in his father; Hyperlipidemia in his father; Hypertension in his mother.    ROS:  Please see the history of present illness.   Otherwise, review of systems are positive for fatigue, diaphoresis.   All other systems are reviewed and negative.    PHYSICAL EXAM: VS:  BP 120/82   Pulse (!) 51   Ht 5\' 6"  (1.676 m)   Wt 211 lb 6.4 oz (95.9 kg)   SpO2 98%   BMI 34.12 kg/m  , BMI Body mass index is 34.12 kg/m. GENERAL:  Well appearing HEENT: Pupils equal round and reactive, fundi not visualized, oral mucosa unremarkable NECK:  No jugular venous distention, waveform within normal limits, carotid upstroke brisk and symmetric, no bruits LUNGS:  Clear to auscultation bilaterally HEART:  RRR.  PMI not displaced or sustained,S1 and S2 within normal limits, no S3, no S4, no clicks, no rubs, no murmurs ABD:  Flat, positive bowel sounds normal in frequency in pitch, no bruits, no rebound, no guarding, no midline pulsatile mass, no  hepatomegaly, no splenomegaly EXT:  2 plus pulses throughout, no edema, no cyanosis no clubbing SKIN:  No rashes no nodules NEURO:  Cranial nerves II through XII grossly intact, motor grossly intact throughout PSYCH:  Cognitively intact, oriented to person place and time  EKG:  EKG is ordered today. The ekg ordered 10/12/15 demonstrates sinus rhythm rate 68 bpm.  Low voltage limb leads and precordial leads.  Prior anteroseptal infarct.   08/16/16: sinus rhythm.  Rate 76 bpm.  Prior anteroseptal infarct.  Lateral TWI.  04/17/17: Sinus rhythm.  Rate 74 bpm.  Prior septal infarct.   Sinus rhythm.  Rate 66 bpm.  Prior septal infarct.  Anterolateral T wave inversions unchanged from  prior. 12/18/2019: Sinus rhythm.  Rate 78 bpm.  Prior anterior infarct.  Lateral T wave inversions.  Echo 06/18/15: Study Conclusions  - Left ventricle: LVEF is severely depressed at approximately 35 to  40% with severe hypokinesis of the mid/distal lateral, mid/distal  septal, distal inferior, apical wals. The cavity size was normal. - Ventricular septum: The contour showed systolic flattening.   Echo 01/02/20: 1. Left ventricular ejection fraction, by estimation, is 15-20%. The left  ventricle has severely decreased function. The left ventricle demonstrates  global hypokinesis with apical akinesis. The left ventricular internal  cavity size was moderately  dilated. Left ventricular diastolic parameters are consistent with Grade I  diastolic dysfunction (impaired relaxation).  2. Right ventricular systolic function is normal. The right ventricular  size is normal. There is mildly elevated pulmonary artery systolic  pressure.  3. Left atrial size was mildly dilated.  4. The mitral valve is normal in structure. Trivial mitral valve  regurgitation.  5. The aortic valve is normal in structure. Aortic valve regurgitation is  trivial. Mild aortic valve sclerosis is present, with no evidence of  aortic valve  stenosis.  6. Aortic dilatation noted. Aneurysm of the aortic root.  7. No evidence of LV thrombus with Definity contrast injection.   Lexiscan Myoview 12/2019:  Nuclear stress EF: 30%.  There was no ST segment deviation noted during stress.  Defect 1: There is a medium defect of severe severity present in the apex location.  Findings consistent with prior myocardial infarction.  This is a high risk study.  The left ventricular ejection fraction is moderately decreased (30-44%).   Abnormal, high risk stress nuclear study with prior apical infarct but no ischemia.  Gated ejection fraction was 30% with global hypokinesis worse at the apex.  Moderate left ventricular enlargement.  Echo 01/02/2020: 1. Left ventricular ejection fraction, by estimation, is 15-20%. The left  ventricle has severely decreased function. The left ventricle demonstrates  global hypokinesis with apical akinesis. The left ventricular internal  cavity size was moderately  dilated. Left ventricular diastolic parameters are consistent with Grade I  diastolic dysfunction (impaired relaxation).  2. Right ventricular systolic function is normal. The right ventricular  size is normal. There is mildly elevated pulmonary artery systolic  pressure.  3. Left atrial size was mildly dilated.  4. The mitral valve is normal in structure. Trivial mitral valve  regurgitation.  5. The aortic valve is normal in structure. Aortic valve regurgitation is  trivial. Mild aortic valve sclerosis is present, with no evidence of  aortic valve stenosis.  6. Aortic dilatation noted. Aneurysm of the aortic root.  7. No evidence of LV thrombus with Definity contrast injection.   Lexiscan Myoview 12/2019:   Nuclear stress EF: 30%.  There was no ST segment deviation noted during stress.  Defect 1: There is a medium defect of severe severity present in the apex location.  Findings consistent with prior myocardial  infarction.  This is a high risk study.  The left ventricular ejection fraction is moderately decreased (30-44%).   Abnormal, high risk stress nuclear study with prior apical infarct but no ischemia.  Gated ejection fraction was 30% with global hypokinesis worse at the apex.  Moderate left ventricular enlargement.   Recent Labs: 12/18/2019: ALT 14; BUN 14; Creatinine, Ser 0.96; Hemoglobin 15.6; Platelets 185; Potassium 4.7; Sodium 137; TSH 1.090    Lipid Panel    Component Value Date/Time   CHOL 116 12/18/2019 0959   TRIG 180 (H) 12/18/2019  0959   HDL 38 (L) 12/18/2019 0959   CHOLHDL 3.1 12/18/2019 0959   CHOLHDL 3.3 04/11/2016 1035   VLDL 24 04/11/2016 1035   LDLCALC 48 12/18/2019 0959       Wt Readings from Last 3 Encounters:  01/27/20 211 lb 6.4 oz (95.9 kg)  01/05/20 213 lb (96.6 kg)  12/18/19 (!) 213 lb (96.6 kg)      ASSESSMENT AND PLAN:  # Chronic systolic and diastolic heart failure:   LVEF continues to decline.  Most recent echo reveals LVEF 15 to 20%.  He does have an ICD in place.  In the past we will try to get him on Entresto.  He has not been able to afford it before because of cost.  He is willing to try again and see if there is better insurance coverage at this time.  Start 24/26 mg twice daily.  Continue carvedilol and spironolactone.  We will stop the losartan.  He did have a stress test that showed no evidence of ischemia.  There was no evidence of prior infarct.  We discussed the possibility of balanced ischemia and whether or not we should pursue cardiac catheterization.  He is not interested at this time.  He is going to work on trying to get more active.  If he develops any exertional symptoms, would have a low threshold for cath.  Check a BMP in 1 week.  # CAD s/p MI: Stable.  No angina.  He has fatigue after exertion as above.  We will get a Lexiscan Myoview to assess for ischemia.  Continue aspirin, carvedilol, and atorvastatin.LDL 48.  Triglycerides  are elevated.  Given that we are starting Sherryll Burger he has had issues with affording it, we will not start Vascepa for now.  Hopefully they will improve with his diet and exercise changes.  # Hypertension: BP well-controlled.  Continue carvedilol and losartan 25mg  daily.   # Fatigue: # Snorning, daytime somnolence: He continues to be uninterested in a sleep study or CPAP machine.  Current medicines are reviewed at length with the patient today.  The patient does not have concerns regarding medicines.  The following changes have been made: Switch losartan to Entresto.  Labs/ tests ordered today include:   Orders Placed This Encounter  Procedures  . Basic metabolic panel  . AMB referral to cardiac rehabilitation     Disposition:   FU with Anoop Hemmer C. , MD, [place] in 3 months.    Signed, Callista Hoh C. [facility], MD, [facility]  01/27/2020 5:54 PM    [facility]

## 2020-01-27 NOTE — Patient Instructions (Signed)
Medication Instructions:  STOP LOSARTAN   START ENTRESTO 24-26 MG TWICE A DAY   *If you need a refill on your cardiac medications before your next appointment, please call your pharmacy*  Lab Work: BMET IN 1 WEEK   If you have labs (blood work) drawn today and your tests are completely normal, you will receive your results only by:  MyChart Message (if you have MyChart) OR  A paper copy in the mail If you have any lab test that is abnormal or we need to change your treatment, we will call you to review the results.  Testing/Procedures: NONE  Follow-Up: At [facility], you and your health needs are our priority.  As part of our continuing mission to provide you with exceptional heart care, we have created designated Provider Care Teams.  These Care Teams include your primary Cardiologist (physician) and Advanced Practice Providers (APPs -  Physician Assistants and Nurse Practitioners) who all work together to provide you with the care you need, when you need it.  We recommend signing up for the patient portal called "MyChart".  Sign up information is provided on this After Visit Summary.  MyChart is used to connect with patients for Virtual Visits (Telemedicine).  Patients are able to view lab/test results, encounter notes, upcoming appointments, etc.  Non-urgent messages can be sent to your provider as well.   To learn more about what you can do with MyChart, go to [email or web address].    Your next appointment:   3 month(s)  The format for your next appointment:   In Person  Provider:   You may see Chilton Si, MD or one of the following Advanced Practice Providers on your designated Care Team:    Corine Shelter, PA-C  [place], [place]  [name], [place]  You have been referred to CARDIAC REHAB   Other Instructions  WALK 30 MINUTES 5 DAYS A WEEK

## 2020-01-28 ENCOUNTER — Ambulatory Visit: Payer: Medicare Other | Admitting: Cardiovascular Disease

## 2020-02-05 DIAGNOSIS — Z5181 Encounter for therapeutic drug level monitoring: Secondary | ICD-10-CM | POA: Diagnosis not present

## 2020-02-05 DIAGNOSIS — I5042 Chronic combined systolic (congestive) and diastolic (congestive) heart failure: Secondary | ICD-10-CM | POA: Diagnosis not present

## 2020-02-06 ENCOUNTER — Encounter (HOSPITAL_COMMUNITY): Payer: Self-pay | Admitting: *Deleted

## 2020-02-06 ENCOUNTER — Telehealth: Payer: Self-pay | Admitting: Internal Medicine

## 2020-02-06 ENCOUNTER — Telehealth (HOSPITAL_COMMUNITY): Payer: Self-pay

## 2020-02-06 LAB — BASIC METABOLIC PANEL
BUN/Creatinine Ratio: 19 (ref 10–24)
BUN: 16 mg/dL (ref 8–27)
CO2: 27 mmol/L (ref 20–29)
Calcium: 10.8 mg/dL — ABNORMAL HIGH (ref 8.6–10.2)
Chloride: 101 mmol/L (ref 96–106)
Creatinine, Ser: 0.85 mg/dL (ref 0.76–1.27)
GFR calc Af Amer: 104 mL/min/{1.73_m2} (ref >59)
GFR calc non Af Amer: 90 mL/min/{1.73_m2} (ref >59)
Glucose: 100 mg/dL — ABNORMAL HIGH (ref 65–99)
Potassium: 5.1 mmol/L (ref 3.5–5.2)
Sodium: 141 mmol/L (ref 134–144)

## 2020-02-06 NOTE — Telephone Encounter (Signed)
   Spouse calling on behalf of patient. Would patient need a pneumonia vaccine this year

## 2020-02-06 NOTE — Telephone Encounter (Signed)
Pt insurance is active and benefits verified through [facility]. Co-pay $0.00, DED $0.00/$0.00 met, out of pocket $3,600.00/$334.27 met, co-insurance 0%. No pre-authorization required. Passport, 02/06/20 @ 10:48AM, IMJ#[id number]  Will contact patient to see if he is interested in the Cardiac Rehab Program.

## 2020-02-06 NOTE — Progress Notes (Signed)
Received referral from Dr.Reid for this pt to participate in Cardiac rehab phase II with the diagnosis of chronic systoli and diastolic heart failure. Pt most recent echo on  Clinical review of pt follow up appt on 01/27/20 showed EF 15-20%.  Pt has ICD. with Dr. Duke Salvia - cardiologist office note.   Pt appropriate for scheduling for on site cardiac rehab and/or enrollment in Virtual Cardiac Rehab.  Pt Covid Risk Score is 7.  Will forward to staff for insurance verification and scheduling Karlene Lineman RN, BSN Cardiac and Pulmonary Rehab Nurse Navigator

## 2020-02-06 NOTE — Telephone Encounter (Signed)
Called and spoke with pt wife [name] in regards to CR. [name] stated pt is interested in CR, pt will come in for orientation on 02/19/20 @ 10AM and will attend the 11:15 class. Went over insurance, patient verbalized understanding.

## 2020-02-09 ENCOUNTER — Telehealth (HOSPITAL_COMMUNITY): Payer: Self-pay | Admitting: Student-PharmD

## 2020-02-09 NOTE — Telephone Encounter (Signed)
No -he is done with pneumonia vaccines

## 2020-02-09 NOTE — Telephone Encounter (Signed)
Cardiac Rehab Medication Review by a Pharmacist  Does the patient  feel that his/her medications are working for him/her?  yes  Has the patient been experiencing any side effects to the medications prescribed?  no  Does the patient measure his/her own blood pressure or blood glucose at home?  Yes - when wife asks him to BP with wrist monitor -  110s/80, sometimes lower   Does the patient have any problems obtaining medications due to transportation or finances?   No - expensive copay for Entresto, waiting to hear back for copay card  Understanding of regimen: good Understanding of indications: good Potential of compliance: good  Yvetta Coder, PharmD PGY1 Acute Care Pharmacy Resident

## 2020-02-09 NOTE — Telephone Encounter (Signed)
Attempted to call pt's wife, VM is not set up to leave msgs.

## 2020-02-13 ENCOUNTER — Ambulatory Visit (INDEPENDENT_AMBULATORY_CARE_PROVIDER_SITE_OTHER): Payer: Medicare Other

## 2020-02-13 ENCOUNTER — Other Ambulatory Visit: Payer: Self-pay

## 2020-02-13 DIAGNOSIS — Z23 Encounter for immunization: Secondary | ICD-10-CM | POA: Diagnosis not present

## 2020-02-17 ENCOUNTER — Telehealth: Payer: Self-pay | Admitting: *Deleted

## 2020-02-17 MED ORDER — SACUBITRIL-VALSARTAN 24-26 MG PO TABS: 1.0000 | ORAL_TABLET | Freq: Two times a day (BID) | ORAL | 3 refills | Status: DC

## 2020-02-17 NOTE — Telephone Encounter (Signed)
Received papers from wife on patient assistance for [facility] Form filled out and will have Dr Duke Salvia sign when she is back in the office

## 2020-02-19 ENCOUNTER — Encounter (HOSPITAL_COMMUNITY)
Admission: RE | Admit: 2020-02-19 | Discharge: 2020-02-19 | Disposition: A | Discharge status: Home / Self Care | Payer: Medicare Other | Source: Ambulatory Visit | Attending: Cardiovascular Disease | Admitting: Cardiovascular Disease

## 2020-02-19 ENCOUNTER — Encounter (HOSPITAL_COMMUNITY): Payer: Self-pay

## 2020-02-19 ENCOUNTER — Other Ambulatory Visit: Payer: Self-pay

## 2020-02-19 VITALS — BP 120/74 | HR 76 | Resp 18 | Ht 65.0 in | Wt 208.6 lb

## 2020-02-19 DIAGNOSIS — I5022 Chronic systolic (congestive) heart failure: Secondary | ICD-10-CM | POA: Insufficient documentation

## 2020-02-19 NOTE — Progress Notes (Signed)
Cardiac Individual Treatment Plan  Patient Details  Name: Spencer Horn MRN: [id number] Date of Birth: 1950/11/17 Referring Provider:     CARDIAC REHAB PHASE II ORIENTATION from 02/19/2020 in [facility] CARDIAC REHAB  Referring Provider Chilton Si, MD      Initial Encounter Date:    CARDIAC REHAB PHASE II ORIENTATION from 02/19/2020 in [facility] CARDIAC REHAB  Date 02/19/20      Visit Diagnosis: Chronic systolic heart failure (HCC)  Patient's Home Medications on Admission:  Current Outpatient Medications:  .  albuterol (VENTOLIN HFA) 108 (90 Base) MCG/ACT inhaler, Inhale 2 puffs into the lungs every 6 (six) hours as needed for wheezing or shortness of breath. (Patient not taking: Reported on 02/09/2020), Disp: 18 g, Rfl: 1 .  aspirin 81 MG tablet, Take 81 mg by mouth daily.  , Disp: , Rfl:  .  atorvastatin (LIPITOR) 40 MG tablet, Take 1 tablet by mouth once daily, Disp: 90 tablet, Rfl: 2 .  carvedilol (COREG) 3.125 MG tablet, Take 1 tablet by mouth twice daily, Disp: 180 tablet, Rfl: 3 .  fluticasone (FLONASE) 50 MCG/ACT nasal spray, Place 2 sprays into both nostrils daily. (Patient not taking: Reported on 02/09/2020), Disp: 16 g, Rfl: 6 .  Multiple Vitamin (MULTIVITAMIN) tablet, Take 1 tablet by mouth daily., Disp: , Rfl:  .  nitroGLYCERIN (NITROSTAT) 0.4 MG SL tablet, Place 1 tablet (0.4 mg total) under the tongue every 5 (five) minutes as needed for chest pain (for chest pain). (Patient not taking: Reported on 02/09/2020), Disp: 25 tablet, Rfl: 6 .  ondansetron (ZOFRAN ODT) 4 MG disintegrating tablet, Take 1 tablet (4 mg total) by mouth every 8 (eight) hours as needed for nausea or vomiting. (Patient not taking: Reported on 02/09/2020), Disp: 3 tablet, Rfl: 0 .  sacubitril-valsartan (ENTRESTO) 24-26 MG, Take 1 tablet by mouth 2 (two) times daily., Disp: 180 tablet, Rfl: 3 .  spironolactone (ALDACTONE) 25 MG tablet, TAKE 1/2 (ONE-HALF) TABLET BY  MOUTH ONCE DAILY . (KEEP OFFICE VISIT), Disp: 45 tablet, Rfl: 2  Past Medical History: Past Medical History:  Diagnosis Date  . Acute on chronic combined systolic and diastolic CHF (congestive heart failure) (HCC) 01/27/2020  . Coronary artery disease   . Dyslipidemia   . Erectile dysfunction 08/18/2016  . Fatigue 12/18/2019  . History of acute anterior wall MI   . Hyperlipidemia   . ICD (implantable cardiac defibrillator) in place June 2012  . Ischemic cardiomyopathy   . Personal history of MRSA (methicillin resistant Staphylococcus aureus)   . Tobacco abuse   . Ventricular dysfunction 07/07/2010   Severe left ventricular dysfunction with ejection fraction of 30-35%    Tobacco Use: Social History   Tobacco Use  Smoking Status Former Smoker  . Packs/day: 1.00  . Years: 40.00  . Pack years: 40.00  . Types: Cigarettes  . Start date: 12  . Quit date: 2012  . Years since quitting: 9.7  Smokeless Tobacco Never Used    Labs: Recent Hydrographic surveyor    Labs for ITP Cardiac and Pulmonary Rehab Latest Ref Rng & Units 04/10/2017 04/23/2017 01/13/2019 02/10/2019 12/18/2019   Cholestrol 100 - 199 mg/dL 132 - 440 - 102   LDLCALC 0 - 99 mg/dL 46 - 50 - 48   HDL >72 mg/dL 53(G) - 64(Q) - 03(K)   Trlycerides 0 - 149 mg/dL 742 - 595 - 638(V)   Hemoglobin A1c 4.6 - 6.5 % - 6.4 - 7.0(H) -  TCO2 0 - 100 mmol/L - - - - -      Capillary Blood Glucose: No results found for: GLUCAP   Exercise Target Goals: Exercise Program Goal: Individual exercise prescription set using results from initial 6 min walk test and THRR while considering  patient's activity barriers and safety.   Exercise Prescription Goal: Initial exercise prescription builds to 30-45 minutes a day of aerobic activity, 2-3 days per week.  Home exercise guidelines will be given to patient during program as part of exercise prescription that the participant will acknowledge.  Activity Barriers & Risk Stratification:   Activity Barriers & Cardiac Risk Stratification - 02/19/20 1120      Activity Barriers & Cardiac Risk Stratification   Activity Barriers Decreased Ventricular Function    Cardiac Risk Stratification High           6 Minute Walk:  6 Minute Walk    Row Name 02/19/20 1026         6 Minute Walk   Phase Initial     Distance 1501 feet     Walk Time 6 minutes     # of Rest Breaks 0     MPH 2.84     METS 3.32     RPE 9     Perceived Dyspnea  2     VO2 Peak 11.6     Symptoms Yes (comment)     Comments SOB, RPD=2, Pt feels due to mask     Resting HR 76 bpm     Resting BP 120/74     Resting Oxygen Saturation  95 %     Exercise Oxygen Saturation  during 6 min walk 96 %     Max Ex. HR 114 bpm     Max Ex. BP 154/80     2 Minute Post BP 130/74            Oxygen Initial Assessment:   Oxygen Re-Evaluation:   Oxygen Discharge (Final Oxygen Re-Evaluation):   Initial Exercise Prescription:  Initial Exercise Prescription - 02/19/20 1100      Date of Initial Exercise RX and Referring Provider   Date 02/19/20    Referring Provider Chilton Si, MD    Expected Discharge Date 04/14/20      NuStep   Level 2    SPM 75    Minutes 15    METs 1.8      Track   Laps 12    Minutes 15    METs 2.39      Prescription Details   Frequency (times per week) 3    Duration Progress to 30 minutes of continuous aerobic without signs/symptoms of physical distress      Intensity   THRR 40-80% of Max Heartrate 61-122    Ratings of Perceived Exertion 11-13    Perceived Dyspnea 0-4      Resistance Training   Training Prescription Yes    Weight 3 lbs           Perform Capillary Blood Glucose checks as needed.  Exercise Prescription Changes:   Exercise Comments:   Exercise Goals and Review:   Exercise Goals    Row Name 02/19/20 1122             Exercise Goals   Increase Physical Activity Yes       Intervention Provide advice, education, support and counseling  about physical activity/exercise needs.;Develop an individualized exercise prescription for aerobic and resistive training based on initial evaluation  findings, risk stratification, comorbidities and participant's personal goals.       Expected Outcomes Short Term: Attend rehab on a regular basis to increase amount of physical activity.;Long Term: Add in home exercise to make exercise part of routine and to increase amount of physical activity.;Long Term: Exercising regularly at least 3-5 days a week.       Increase Strength and Stamina Yes       Intervention Provide advice, education, support and counseling about physical activity/exercise needs.;Develop an individualized exercise prescription for aerobic and resistive training based on initial evaluation findings, risk stratification, comorbidities and participant's personal goals.       Expected Outcomes Short Term: Increase workloads from initial exercise prescription for resistance, speed, and METs.;Short Term: Perform resistance training exercises routinely during rehab and add in resistance training at home;Long Term: Improve cardiorespiratory fitness, muscular endurance and strength as measured by increased METs and functional capacity ( )       Able to understand and use rate of perceived exertion (RPE) scale Yes       Intervention Provide education and explanation on how to use RPE scale       Expected Outcomes Short Term: Able to use RPE daily in rehab to express subjective intensity level;Long Term:  Able to use RPE to guide intensity level when exercising independently       Knowledge and understanding of Target Heart Rate Range (THRR) Yes       Intervention Provide education and explanation of THRR including how the numbers were predicted and where they are located for reference       Expected Outcomes Short Term: Able to state/look up THRR;Long Term: Able to use THRR to govern intensity when exercising independently;Short Term: Able to  use daily as guideline for intensity in rehab       Able to check pulse independently Yes       Intervention Provide education and demonstration on how to check pulse in carotid and radial arteries.;Review the importance of being able to check your own pulse for safety during independent exercise       Expected Outcomes Short Term: Able to explain why pulse checking is important during independent exercise;Long Term: Able to check pulse independently and accurately       Understanding of Exercise Prescription Yes       Intervention Provide education, explanation, and written materials on patient's individual exercise prescription       Expected Outcomes Short Term: Able to explain program exercise prescription;Long Term: Able to explain home exercise prescription to exercise independently              Exercise Goals Re-Evaluation :   Discharge Exercise Prescription (Final Exercise Prescription Changes):   Nutrition:  Target Goals: Understanding of nutrition guidelines, daily intake of sodium 1500mg , cholesterol 200mg , calories 30% from fat and 7% or less from saturated fats, daily to have 5 or more servings of fruits and vegetables.  Biometrics:  Pre Biometrics - 02/19/20 0959      Pre Biometrics   Waist Circumference 49 inches    Hip Circumference 45 inches    Waist to Hip Ratio 1.09 %    Triceps Skinfold 21 mm    % Body Fat 36.3 %    Grip Strength 42 kg    Flexibility 13.5 in    Single Leg Stand 20.62 seconds            Nutrition Therapy Plan and Nutrition Goals:   Nutrition Assessments:  Nutrition Goals Re-Evaluation:   Nutrition Goals Re-Evaluation:   Nutrition Goals Discharge (Final Nutrition Goals Re-Evaluation):   Psychosocial: Target Goals: Acknowledge presence or absence of significant depression and/or stress, maximize coping skills, provide positive support system. Participant is able to verbalize types and ability to use techniques and skills  needed for reducing stress and depression.  Initial Review & Psychosocial Screening:  Initial Psych Review & Screening - 02/19/20 1319      Initial Review   Current issues with None Identified      Family Dynamics   Good Support System? Yes    Comments [name] presents to cardiac rehab orientation with a positive attitude and outlook. He is married with 2 children and 3 grandchildren who live locally. His support system is intact. States he "loves living life". No identifiable psychosocial barriers to participation in cardiac rehab. No interventions needed.      Barriers   Psychosocial barriers to participate in program There are no identifiable barriers or psychosocial needs.      Screening Interventions   Interventions Encouraged to exercise           Quality of Life Scores:  Scores of 19 and below usually indicate a poorer quality of life in these areas.  A difference of  2-3 points is a clinically meaningful difference.  A difference of 2-3 points in the total score of the Quality of Life Index has been associated with significant improvement in overall quality of life, self-image, physical symptoms, and general health in studies assessing change in quality of life.  PHQ-9: Recent Review Flowsheet Data    Depression screen [facility] 02/19/2020 02/10/2019 01/30/2018   Decreased Interest 0 0 0   Down, Depressed, Hopeless 0 0 0   PHQ - 2 Score 0 0 0     Interpretation of Total Score  Total Score Depression Severity:  1-4 = Minimal depression, 5-9 = Mild depression, 10-14 = Moderate depression, 15-19 = Moderately severe depression, 20-27 = Severe depression   Psychosocial Evaluation and Intervention:   Psychosocial Re-Evaluation:   Psychosocial Discharge (Final Psychosocial Re-Evaluation):   Vocational Rehabilitation: Provide vocational rehab assistance to qualifying candidates.   Vocational Rehab Evaluation & Intervention:   Education: Education Goals: Education  classes will be provided on a weekly basis, covering required topics. Participant will state understanding/return demonstration of topics presented.  Learning Barriers/Preferences:  Learning Barriers/Preferences - 02/19/20 1108      Learning Barriers/Preferences   Learning Barriers Reading;Sight   Wears glasses   Learning Preferences Individual Instruction;Skilled Demonstration           Education Topics: Count Your Pulse:  -Group instruction provided by verbal instruction, demonstration, patient participation and written materials to support subject.  Instructors address importance of being able to find your pulse and how to count your pulse when at home without a heart monitor.  Patients get hands on experience counting their pulse with staff help and individually.   Heart Attack, Angina, and Risk Factor Modification:  -Group instruction provided by verbal instruction, video, and written materials to support subject.  Instructors address signs and symptoms of angina and heart attacks.    Also discuss risk factors for heart disease and how to make changes to improve heart health risk factors.   Functional Fitness:  -Group instruction provided by verbal instruction, demonstration, patient participation, and written materials to support subject.  Instructors address safety measures for doing things around the house.  Discuss how to get up and down  off the floor, how to pick things up properly, how to safely get out of a chair without assistance, and balance training.   Meditation and Mindfulness:  -Group instruction provided by verbal instruction, patient participation, and written materials to support subject.  Instructor addresses importance of mindfulness and meditation practice to help reduce stress and improve awareness.  Instructor also leads participants through a meditation exercise.    Stretching for Flexibility and Mobility:  -Group instruction provided by verbal instruction,  patient participation, and written materials to support subject.  Instructors lead participants through series of stretches that are designed to increase flexibility thus improving mobility.  These stretches are additional exercise for major muscle groups that are typically performed during regular warm up and cool down.   Hands Only CPR:  -Group verbal, video, and participation provides a basic overview of AHA guidelines for community CPR. Role-play of emergencies allow participants the opportunity to practice calling for help and chest compression technique with discussion of AED use.   Hypertension: -Group verbal and written instruction that provides a basic overview of hypertension including the most recent diagnostic guidelines, risk factor reduction with self-care instructions and medication management.    Nutrition I class: Heart Healthy Eating:  -Group instruction provided by PowerPoint slides, verbal discussion, and written materials to support subject matter. The instructor gives an explanation and review of the Therapeutic Lifestyle Changes diet recommendations, which includes a discussion on lipid goals, dietary fat, sodium, fiber, plant stanol/sterol esters, sugar, and the components of a well-balanced, healthy diet.   Nutrition II class: Lifestyle Skills:  -Group instruction provided by PowerPoint slides, verbal discussion, and written materials to support subject matter. The instructor gives an explanation and review of label reading, grocery shopping for heart health, heart healthy recipe modifications, and ways to make healthier choices when eating out.   Diabetes Question & Answer:  -Group instruction provided by PowerPoint slides, verbal discussion, and written materials to support subject matter. The instructor gives an explanation and review of diabetes co-morbidities, pre- and post-prandial blood glucose goals, pre-exercise blood glucose goals, signs, symptoms, and  treatment of hypoglycemia and hyperglycemia, and foot care basics.   Diabetes Blitz:  -Group instruction provided by PowerPoint slides, verbal discussion, and written materials to support subject matter. The instructor gives an explanation and review of the physiology behind type 1 and type 2 diabetes, diabetes medications and rational behind using different medications, pre- and post-prandial blood glucose recommendations and Hemoglobin A1c goals, diabetes diet, and exercise including blood glucose guidelines for exercising safely.    Portion Distortion:  -Group instruction provided by PowerPoint slides, verbal discussion, written materials, and food models to support subject matter. The instructor gives an explanation of serving size versus portion size, changes in portions sizes over the last 20 years, and what consists of a serving from each food group.   Stress Management:  -Group instruction provided by verbal instruction, video, and written materials to support subject matter.  Instructors review role of stress in heart disease and how to cope with stress positively.     Exercising on Your Own:  -Group instruction provided by verbal instruction, power point, and written materials to support subject.  Instructors discuss benefits of exercise, components of exercise, frequency and intensity of exercise, and end points for exercise.  Also discuss use of nitroglycerin and activating EMS.  Review options of places to exercise outside of rehab.  Review guidelines for sex with heart disease.   Cardiac Drugs I:  -Group instruction  provided by verbal instruction and written materials to support subject.  Instructor reviews cardiac drug classes: antiplatelets, anticoagulants, beta blockers, and statins.  Instructor discusses reasons, side effects, and lifestyle considerations for each drug class.   Cardiac Drugs II:  -Group instruction provided by verbal instruction and written materials to  support subject.  Instructor reviews cardiac drug classes: angiotensin converting enzyme inhibitors (ACE-I), angiotensin II receptor blockers (ARBs), nitrates, and calcium channel blockers.  Instructor discusses reasons, side effects, and lifestyle considerations for each drug class.   Anatomy and Physiology of the Circulatory System:  Group verbal and written instruction and models provide basic cardiac anatomy and physiology, with the coronary electrical and arterial systems. Review of: AMI, Angina, Valve disease, Heart Failure, Peripheral Artery Disease, Cardiac Arrhythmia, Pacemakers, and the ICD.   Other Education:  -Group or individual verbal, written, or video instructions that support the educational goals of the cardiac rehab program.   Holiday Eating Survival Tips:  -Group instruction provided by PowerPoint slides, verbal discussion, and written materials to support subject matter. The instructor gives patients tips, tricks, and techniques to help them not only survive but enjoy the holidays despite the onslaught of food that accompanies the holidays.   Knowledge Questionnaire Score:   Core Components/Risk Factors/Patient Goals at Admission:  Personal Goals and Risk Factors at Admission - 02/19/20 1110      Core Components/Risk Factors/Patient Goals on Admission    Weight Management Yes;Obesity;Weight Loss    Admit Weight 208 lb 8.9 oz (94.6 kg)    Expected Outcomes Short Term: Continue to assess and modify interventions until short term weight is achieved;Long Term: Adherence to nutrition and physical activity/exercise program aimed toward attainment of established weight goal;Weight Maintenance: Understanding of the daily nutrition guidelines, which includes 25-35% calories from fat, 7% or less cal from saturated fats, less than  cholesterol, less than 1.5gm of sodium, & 5 or more servings of fruits and vegetables daily;Weight Loss: Understanding of general recommendations  for a balanced deficit meal plan, which promotes 1-2 lb weight loss per week and includes a negative energy balance of [phone] kcal/d;Understanding recommendations for meals to include 15-35% energy as protein, 25-35% energy from fat, 35-60% energy from carbohydrates, less than  of dietary cholesterol, 20-35 gm of total fiber daily;Understanding of distribution of calorie intake throughout the day with the consumption of 4-5 meals/snacks    Diabetes Yes    Intervention Provide education about signs/symptoms and action to take for hypo/hyperglycemia.;Provide education about proper nutrition, including hydration, and aerobic/resistive exercise prescription along with prescribed medications to achieve blood glucose in normal ranges: Fasting glucose 65-99 mg/dL    Expected Outcomes Short Term: Participant verbalizes understanding of the signs/symptoms and immediate care of hyper/hypoglycemia, proper foot care and importance of medication, aerobic/resistive exercise and nutrition plan for blood glucose control.;Long Term: Attainment of HbA1C < 7%.    Heart Failure Yes    Intervention Provide a combined exercise and nutrition program that is supplemented with education, support and counseling about heart failure. Directed toward relieving symptoms such as shortness of breath, decreased exercise tolerance, and extremity edema.    Expected Outcomes Improve functional capacity of life;Short term: Attendance in program 2-3 days a week with increased exercise capacity. Reported lower sodium intake. Reported increased fruit and vegetable intake. Reports medication compliance.;Long term: Adoption of self-care skills and reduction of barriers for early signs and symptoms recognition and intervention leading to self-care maintenance.;Short term: Daily weights obtained and reported for increase. Utilizing diuretic protocols set  by physician.    Hypertension Yes    Intervention Provide education on lifestyle  modifcations including regular physical activity/exercise, weight management, moderate sodium restriction and increased consumption of fresh fruit, vegetables, and low fat dairy, alcohol moderation, and smoking cessation.;Monitor prescription use compliance.    Expected Outcomes Short Term: Continued assessment and intervention until BP is < 140/38mm HG in hypertensive participants. < 130/80mm HG in hypertensive participants with diabetes, heart failure or chronic kidney disease.;Long Term: Maintenance of blood pressure at goal levels.    Lipids Yes    Intervention Provide education and support for participant on nutrition & aerobic/resistive exercise along with prescribed medications to achieve LDL 70mg , HDL >40mg .    Expected Outcomes Short Term: Participant states understanding of desired cholesterol values and is compliant with medications prescribed. Participant is following exercise prescription and nutrition guidelines.;Long Term: Cholesterol controlled with medications as prescribed, with individualized exercise RX and with personalized nutrition plan. Value goals: LDL < , HDL > 40 mg.           Core Components/Risk Factors/Patient Goals Review:    Core Components/Risk Factors/Patient Goals at Discharge (Final Review):    ITP Comments:  ITP Comments    Row Name 02/19/20 0957           ITP Comments Dr. Armanda Magic Medical Director Cardiac Rehab [facility]              Comments: Patient attended orientation on 02/19/2020 to review rules and guidelines for program.  Completed 6 minute walk test, Intitial ITP, and exercise prescription.  VSS. Telemetry-NSR. Asymptomatic. Safety measures and social distancing in place per CDC guidelines.

## 2020-02-23 ENCOUNTER — Encounter (HOSPITAL_COMMUNITY): Payer: Medicare Other

## 2020-02-25 ENCOUNTER — Encounter (HOSPITAL_COMMUNITY)
Admission: RE | Admit: 2020-02-25 | Discharge: 2020-02-25 | Disposition: A | Discharge status: Home / Self Care | Payer: Medicare Other | Source: Ambulatory Visit | Attending: Cardiovascular Disease | Admitting: Cardiovascular Disease

## 2020-02-25 ENCOUNTER — Other Ambulatory Visit: Payer: Self-pay

## 2020-02-25 DIAGNOSIS — I5022 Chronic systolic (congestive) heart failure: Secondary | ICD-10-CM | POA: Diagnosis present

## 2020-02-25 NOTE — Progress Notes (Signed)
Daily Session Note  Patient Details  Name: Spencer Horn MRN: [id number] Date of Birth: 1950/09/25 Referring Provider:     [place] from 02/19/2020 in [place]  Referring Provider Spencer Latch, MD      Encounter Date: 02/25/2020  Check In:  Session Check In - 02/25/20 1115      Check-In   Supervising physician immediately available to respond to emergencies [facility] immediately available    Physician(s) Spencer Horn    Location [facility]    Staff Present Spencer Rubenstein, MS, EP-C, CCRP;Spencer Carol Ada, MS,ACSM CEP, Exercise Physiologist;Spencer Rollene Rotunda, RN, Spencer Cash, RN    Virtual Visit No    Medication changes reported     No    Fall or balance concerns reported    No    Tobacco Cessation No Change    Warm-up and Cool-down Performed on first and last piece of equipment    Resistance Training Performed No    VAD Patient? No    PAD/SET Patient? No      Pain Assessment   Currently in Pain? No/denies    Multiple Pain Sites No           Capillary Blood Glucose: No results found for this or any previous visit (from the past 24 hour(s)).   Exercise Prescription Changes - 02/25/20 1300      Response to Exercise   Blood Pressure (Admit) 120/66    Blood Pressure (Exercise) 128/74    Blood Pressure (Exit) 110/78    Heart Rate (Admit) 89 bpm    Heart Rate (Exercise) 105 bpm    Heart Rate (Exit) 89 bpm    Rating of Perceived Exertion (Exercise) 11    Perceived Dyspnea (Exercise) 0    Symptoms None    Comments Pt's first day of exercise    Duration Progress to 30 minutes of  aerobic without signs/symptoms of physical distress    Intensity THRR unchanged      Progression   Progression Continue to progress workloads to maintain intensity without signs/symptoms of physical distress.    Average METs 1.9      Resistance Training   Training Prescription No      Interval Training    Interval Training No      NuStep   Level 2    SPM 75    Minutes 15    METs 1.9      Track   Laps 14    Minutes 15    METs 2.62           Social History   Tobacco Use  Smoking Status Former Smoker  . Packs/day: 1.00  . Years: 40.00  . Pack years: 40.00  . Types: Cigarettes  . Start date: 47  . Quit date: 2012  . Years since quitting: 9.7  Smokeless Tobacco Never Used    Goals Met:  Exercise tolerated well No report of cardiac concerns or symptoms  Goals Unmet:  Not Applicable  Comments: Pt started cardiac rehab today.  Pt tolerated light exercise without difficulty. VSS, telemetry-Sinus Rhythm, asymptomatic.  Medication list reconciled. Pt denies barriers to medicaiton compliance.  PSYCHOSOCIAL ASSESSMENT:  PHQ-0. Pt exhibits positive coping skills, hopeful outlook with supportive family. No psychosocial needs identified at this time, no psychosocial interventions necessary.    Pt enjoys yard work and house duties.   Pt oriented to exercise equipment and routine.    Understanding verbalized.Spencer Pall,  RN,BSN 02/25/2020 4:33 PM   Spencer Horn is Medical Director for Cardiac Rehab at [facility].

## 2020-02-27 ENCOUNTER — Encounter (HOSPITAL_COMMUNITY): Payer: Medicare Other

## 2020-02-27 NOTE — Telephone Encounter (Signed)
Advised wife, verbalized understanding.  

## 2020-02-27 NOTE — Telephone Encounter (Signed)
Received notification approved until through end of calender year Tried to call patient, no voicemail set up

## 2020-03-01 ENCOUNTER — Encounter (HOSPITAL_COMMUNITY)
Admission: RE | Admit: 2020-03-01 | Discharge: 2020-03-01 | Disposition: A | Discharge status: Home / Self Care | Payer: Medicare Other | Source: Ambulatory Visit | Attending: Cardiovascular Disease | Admitting: Cardiovascular Disease

## 2020-03-01 ENCOUNTER — Other Ambulatory Visit: Payer: Self-pay

## 2020-03-01 DIAGNOSIS — I5022 Chronic systolic (congestive) heart failure: Secondary | ICD-10-CM | POA: Diagnosis not present

## 2020-03-03 ENCOUNTER — Encounter (HOSPITAL_COMMUNITY)
Admission: RE | Admit: 2020-03-03 | Discharge: 2020-03-03 | Disposition: A | Discharge status: Home / Self Care | Payer: Medicare Other | Source: Ambulatory Visit | Attending: Cardiovascular Disease | Admitting: Cardiovascular Disease

## 2020-03-03 ENCOUNTER — Other Ambulatory Visit: Payer: Self-pay

## 2020-03-03 DIAGNOSIS — I5022 Chronic systolic (congestive) heart failure: Secondary | ICD-10-CM

## 2020-03-03 NOTE — Progress Notes (Signed)
Spencer Horn 69 y.o. male Nutrition Note   Visit Diagnosis: Chronic systolic heart failure [facility])  Past Medical History:  Diagnosis Date  . Acute on chronic combined systolic and diastolic CHF (congestive heart failure) (HCC) 01/27/2020  . Coronary artery disease   . Dyslipidemia   . Erectile dysfunction 08/18/2016  . Fatigue 12/18/2019  . History of acute anterior wall MI   . Hyperlipidemia   . ICD (implantable cardiac defibrillator) in place June 2012  . Ischemic cardiomyopathy   . Personal history of MRSA (methicillin resistant Staphylococcus aureus)   . Tobacco abuse   . Ventricular dysfunction 07/07/2010   Severe left ventricular dysfunction with ejection fraction of 30-35%     Medications reviewed.   Current Outpatient Medications:  .  albuterol (VENTOLIN HFA) 108 (90 Base) MCG/ACT inhaler, Inhale 2 puffs into the lungs every 6 (six) hours as needed for wheezing or shortness of breath. (Patient not taking: Reported on 02/09/2020), Disp: 18 g, Rfl: 1 .  aspirin 81 MG tablet, Take 81 mg by mouth daily.  , Disp: , Rfl:  .  atorvastatin (LIPITOR) 40 MG tablet, Take 1 tablet by mouth once daily, Disp: 90 tablet, Rfl: 2 .  carvedilol (COREG) 3.125 MG tablet, Take 1 tablet by mouth twice daily, Disp: 180 tablet, Rfl: 3 .  fluticasone (FLONASE) 50 MCG/ACT nasal spray, Place 2 sprays into both nostrils daily. (Patient not taking: Reported on 02/09/2020), Disp: 16 g, Rfl: 6 .  Multiple Vitamin (MULTIVITAMIN) tablet, Take 1 tablet by mouth daily., Disp: , Rfl:  .  nitroGLYCERIN (NITROSTAT) 0.4 MG SL tablet, Place 1 tablet (0.4 mg total) under the tongue every 5 (five) minutes as needed for chest pain (for chest pain). (Patient not taking: Reported on 02/09/2020), Disp: 25 tablet, Rfl: 6 .  ondansetron (ZOFRAN ODT) 4 MG disintegrating tablet, Take 1 tablet (4 mg total) by mouth every 8 (eight) hours as needed for nausea or vomiting. (Patient not taking: Reported on 02/09/2020), Disp: 3 tablet,  Rfl: 0 .  sacubitril-valsartan (ENTRESTO) 24-26 MG, Take 1 tablet by mouth 2 (two) times daily., Disp: 180 tablet, Rfl: 3 .  spironolactone (ALDACTONE) 25 MG tablet, TAKE 1/2 (ONE-HALF) TABLET BY MOUTH ONCE DAILY . (KEEP OFFICE VISIT), Disp: 45 tablet, Rfl: 2   Ht Readings from Last 1 Encounters:  02/19/20 5\' 5"  (1.651 m)     Wt Readings from Last 3 Encounters:  02/19/20 208 lb 8.9 oz (94.6 kg)  01/27/20 211 lb 6.4 oz (95.9 kg)  01/05/20 213 lb (96.6 kg)     There is no height or weight on file to calculate BMI.   Social History   Tobacco Use  Smoking Status Former Smoker  . Packs/day: 1.00  . Years: 40.00  . Pack years: 40.00  . Types: Cigarettes  . Start date: 77  . Quit date: 2012  . Years since quitting: 9.7  Smokeless Tobacco Never Used     Lab Results  Component Value Date   CHOL 116 12/18/2019   Lab Results  Component Value Date   HDL 38 (L) 12/18/2019   Lab Results  Component Value Date   LDLCALC 48 12/18/2019   Lab Results  Component Value Date   TRIG 180 (H) 12/18/2019     Lab Results  Component Value Date   HGBA1C 7.0 (H) 02/10/2019     CBG (last 3)  No results for input(s): GLUCAP in the last 72 hours.   Nutrition Note  Spoke with pt.  Nutrition Plan and Nutrition Survey goals reviewed with pt.  Pt is keeping daily weight and exercise log. He wants to lose weight - goal 200 lbs.   Last A1C 7.0 indicates diabetes range. Pt unaware. We discussed simple lifestyle changes - increased walking, reducing foods like potatoes Pt with dx of CHF. Per discussion, pt does use canned/convenience foods often. Pt does add a small amount of salt sometimes. Pt does not eat out frequently. He is cutting back on portion sizes and focusing on exercise for now.   Pt expressed understanding of the information reviewed.    Nutrition Diagnosis ? Food-and nutrition-related knowledge deficit related to lack of exposure to information as related to diagnosis of:  ? CVD ? Type 2 Diabetes  Nutrition Intervention ? Pt's individual nutrition plan reviewed with pt. ? Benefits of adopting Heart Healthy diet discussed when Medficts reviewed.   ? Continue client-centered nutrition education by RD, as part of interdisciplinary care.  Goal(s) ? Pt to reduce sodium intake by avoiding salt shaker  ? Pt to build a healthy plate including vegetables, fruits, whole grains, and low-fat dairy products in a heart healthy meal plan. ? Pt able to name foods that affect blood glucose  Plan:    Will provide client-centered nutrition education as part of interdisciplinary care  Monitor and evaluate progress toward nutrition goal with team.   Andrey Campanile, MS, RDN, LDN

## 2020-03-05 ENCOUNTER — Encounter (HOSPITAL_COMMUNITY): Payer: Medicare Other

## 2020-03-08 ENCOUNTER — Other Ambulatory Visit: Payer: Self-pay

## 2020-03-08 ENCOUNTER — Encounter (HOSPITAL_COMMUNITY)
Admission: RE | Admit: 2020-03-08 | Discharge: 2020-03-08 | Disposition: A | Discharge status: Home / Self Care | Payer: Medicare Other | Source: Ambulatory Visit | Attending: Cardiovascular Disease | Admitting: Cardiovascular Disease

## 2020-03-08 DIAGNOSIS — I5022 Chronic systolic (congestive) heart failure: Secondary | ICD-10-CM

## 2020-03-10 ENCOUNTER — Encounter (HOSPITAL_COMMUNITY)
Admission: RE | Admit: 2020-03-10 | Discharge: 2020-03-10 | Disposition: A | Discharge status: Home / Self Care | Payer: Medicare Other | Source: Ambulatory Visit | Attending: Cardiovascular Disease | Admitting: Cardiovascular Disease

## 2020-03-10 ENCOUNTER — Other Ambulatory Visit: Payer: Self-pay

## 2020-03-10 DIAGNOSIS — I5022 Chronic systolic (congestive) heart failure: Secondary | ICD-10-CM

## 2020-03-10 NOTE — Progress Notes (Signed)
I have reviewed a Home Exercise Prescription with Spencer Horn . Spencer Horn is currently exercising at home. The patient was advised to continue to walk 4-5 days a week for 30-45 minutes.  Spencer Horn and I discussed how to progress their exercise prescription.The patient stated that they understand the exercise prescription. We reviewed exercise guidelines, target heart rate during exercise, RPE Scale, weather conditions, NTG use, endpoints for exercise, warmup and cool down.  Patient is encouraged to come to me with any questions. I will continue to follow up with the patient to assist them with progression and safety.    York Cerise MS, CEP CSCS 12:20 PM 03/10/2020

## 2020-03-12 ENCOUNTER — Encounter (HOSPITAL_COMMUNITY)
Admission: RE | Admit: 2020-03-12 | Discharge: 2020-03-12 | Disposition: A | Discharge status: Home / Self Care | Payer: Medicare Other | Source: Ambulatory Visit | Attending: Cardiovascular Disease | Admitting: Cardiovascular Disease

## 2020-03-12 ENCOUNTER — Ambulatory Visit (INDEPENDENT_AMBULATORY_CARE_PROVIDER_SITE_OTHER): Payer: Medicare Other

## 2020-03-12 ENCOUNTER — Other Ambulatory Visit: Payer: Self-pay

## 2020-03-12 DIAGNOSIS — I5022 Chronic systolic (congestive) heart failure: Secondary | ICD-10-CM | POA: Diagnosis not present

## 2020-03-12 DIAGNOSIS — I255 Ischemic cardiomyopathy: Secondary | ICD-10-CM

## 2020-03-12 DIAGNOSIS — I5042 Chronic combined systolic (congestive) and diastolic (congestive) heart failure: Secondary | ICD-10-CM

## 2020-03-13 LAB — CUP PACEART REMOTE DEVICE CHECK
Battery Remaining Longevity: 29 mo
Battery Remaining Percentage: 25 %
Battery Voltage: 2.81 V
Brady Statistic RV Percent Paced: 1 %
Date Time Interrogation Session: 20211022020014
HighPow Impedance: 84 Ohm
HighPow Impedance: 84 Ohm
Implantable Lead Implant Date: 20120621
Implantable Lead Location: 753860
Implantable Pulse Generator Implant Date: 20120621
Lead Channel Impedance Value: 940 Ohm
Lead Channel Pacing Threshold Amplitude: 1.25 V
Lead Channel Pacing Threshold Pulse Width: 0.5 ms
Lead Channel Sensing Intrinsic Amplitude: 12 mV
Lead Channel Setting Pacing Amplitude: 2.5 V
Lead Channel Setting Pacing Pulse Width: 0.5 ms
Lead Channel Setting Sensing Sensitivity: 0.5 mV
Pulse Gen Serial Number: 819170

## 2020-03-15 ENCOUNTER — Other Ambulatory Visit: Payer: Self-pay

## 2020-03-15 ENCOUNTER — Encounter (HOSPITAL_COMMUNITY)
Admission: RE | Admit: 2020-03-15 | Discharge: 2020-03-15 | Disposition: A | Discharge status: Home / Self Care | Payer: Medicare Other | Source: Ambulatory Visit | Attending: Cardiovascular Disease | Admitting: Cardiovascular Disease

## 2020-03-15 DIAGNOSIS — I5022 Chronic systolic (congestive) heart failure: Secondary | ICD-10-CM | POA: Diagnosis not present

## 2020-03-17 ENCOUNTER — Encounter (HOSPITAL_COMMUNITY)
Admission: RE | Admit: 2020-03-17 | Discharge: 2020-03-17 | Disposition: A | Discharge status: Home / Self Care | Payer: Medicare Other | Source: Ambulatory Visit | Attending: Cardiovascular Disease | Admitting: Cardiovascular Disease

## 2020-03-17 ENCOUNTER — Other Ambulatory Visit: Payer: Self-pay

## 2020-03-17 DIAGNOSIS — I5022 Chronic systolic (congestive) heart failure: Secondary | ICD-10-CM | POA: Diagnosis not present

## 2020-03-17 NOTE — Progress Notes (Signed)
QUALITY OF LIFE SCORE REVIEW  Pt completed Quality of Life survey as a participant in Cardiac Rehab. Scores 21.0 or below are considered low. Pt score very low in several areas Overall 17.64, Health and Function 14.0, socioeconomic 24.00, physiological and spiritual 23.14, family 13.20. Patient quality of life slightly altered by physical constraints which limits ability to perform as prior to recent cardiac illness. Demyan's wife actually filled out the quality of life questionnaire. Ramesh says that things are actually getting better. Debbie is enjoying participating in phase 2 cardiac rehab. Jontay denies being depressed.  Offered emotional support and reassurance.  Will continue to monitor and intervene as necessary.  Gladstone Lighter, RN,BSN 03/17/2020 12:10 PM

## 2020-03-17 NOTE — Progress Notes (Signed)
Remote ICD transmission.   

## 2020-03-17 NOTE — Progress Notes (Signed)
Cardiac Individual Treatment Plan  Patient Details  Name: Spencer Horn MRN: [id number] Date of Birth: [birth year] Referring Provider:     CARDIAC REHAB PHASE II ORIENTATION from 02/19/2020 in [facility] CARDIAC REHAB  Referring Provider Chilton Si, MD      Initial Encounter Date:    CARDIAC REHAB PHASE II ORIENTATION from 02/19/2020 in [facility] CARDIAC REHAB  Date 02/19/20      Visit Diagnosis: Chronic systolic heart failure (HCC)  Patient's Home Medications on Admission:  Current Outpatient Medications:  .  albuterol (VENTOLIN HFA) 108 (90 Base) MCG/ACT inhaler, Inhale 2 puffs into the lungs every 6 (six) hours as needed for wheezing or shortness of breath. (Patient not taking: Reported on 02/09/2020), Disp: 18 g, Rfl: 1 .  aspirin 81 MG tablet, Take 81 mg by mouth daily.  , Disp: , Rfl:  .  atorvastatin (LIPITOR) 40 MG tablet, Take 1 tablet by mouth once daily, Disp: 90 tablet, Rfl: 2 .  carvedilol (COREG) 3.125 MG tablet, Take 1 tablet by mouth twice daily, Disp: 180 tablet, Rfl: 3 .  fluticasone (FLONASE) 50 MCG/ACT nasal spray, Place 2 sprays into both nostrils daily. (Patient not taking: Reported on 02/09/2020), Disp: 16 g, Rfl: 6 .  Multiple Vitamin (MULTIVITAMIN) tablet, Take 1 tablet by mouth daily., Disp: , Rfl:  .  nitroGLYCERIN (NITROSTAT) 0.4 MG SL tablet, Place 1 tablet (0.4 mg total) under the tongue every 5 (five) minutes as needed for chest pain (for chest pain). (Patient not taking: Reported on 02/09/2020), Disp: 25 tablet, Rfl: 6 .  ondansetron (ZOFRAN ODT) 4 MG disintegrating tablet, Take 1 tablet (4 mg total) by mouth every 8 (eight) hours as needed for nausea or vomiting. (Patient not taking: Reported on 02/09/2020), Disp: 3 tablet, Rfl: 0 .  sacubitril-valsartan (ENTRESTO) 24-26 MG, Take 1 tablet by mouth 2 (two) times daily., Disp: 180 tablet, Rfl: 3 .  spironolactone (ALDACTONE) 25 MG tablet, TAKE 1/2 (ONE-HALF) TABLET BY  MOUTH ONCE DAILY . (KEEP OFFICE VISIT), Disp: 45 tablet, Rfl: 2  Past Medical History: Past Medical History:  Diagnosis Date  . Acute on chronic combined systolic and diastolic CHF (congestive heart failure) (HCC) 01/27/2020  . Coronary artery disease   . Dyslipidemia   . Erectile dysfunction 08/18/2016  . Fatigue 12/18/2019  . History of acute anterior wall MI   . Hyperlipidemia   . ICD (implantable cardiac defibrillator) in place June 2012  . Ischemic cardiomyopathy   . Personal history of MRSA (methicillin resistant Staphylococcus aureus)   . Tobacco abuse   . Ventricular dysfunction 07/07/2010   Severe left ventricular dysfunction with ejection fraction of 30-35%    Tobacco Use: Social History   Tobacco Use  Smoking Status Former Smoker  . Packs/day: 1.00  . Years: 40.00  . Pack years: 40.00  . Types: Cigarettes  . Start date: 14  . Quit date: 2012  . Years since quitting: 9.8  Smokeless Tobacco Never Used    Labs: Recent Hydrographic surveyor    Labs for ITP Cardiac and Pulmonary Rehab Latest Ref Rng & Units 04/10/2017 04/23/2017 01/13/2019 02/10/2019 12/18/2019   Cholestrol 100 - 199 mg/dL 798 - 921 - 194   LDLCALC 0 - 99 mg/dL 46 - 50 - 48   HDL >17 mg/dL 40(C) - 14(G) - 81(E)   Trlycerides 0 - 149 mg/dL 563 - 149 - 702(O)   Hemoglobin A1c 4.6 - 6.5 % - 6.4 - 7.0(H) -  TCO2 0 - 100 mmol/L - - - - -      Capillary Blood Glucose: No results found for: GLUCAP   Exercise Target Goals: Exercise Program Goal: Individual exercise prescription set using results from initial 6 min walk test and THRR while considering  patient's activity barriers and safety.   Exercise Prescription Goal: Starting with aerobic activity 30 plus minutes a day, 3 days per week for initial exercise prescription. Provide home exercise prescription and guidelines that participant acknowledges understanding prior to discharge.  Activity Barriers & Risk Stratification:  Activity Barriers &  Cardiac Risk Stratification - 02/19/20 1120      Activity Barriers & Cardiac Risk Stratification   Activity Barriers Decreased Ventricular Function    Cardiac Risk Stratification High           6 Minute Walk:  6 Minute Walk    Row Name 02/19/20 1026         6 Minute Walk   Phase Initial     Distance 1501 feet     Walk Time 6 minutes     # of Rest Breaks 0     MPH 2.84     METS 3.32     RPE 9     Perceived Dyspnea  2     VO2 Peak 11.6     Symptoms Yes (comment)     Comments SOB, RPD=2, Pt feels due to mask     Resting HR 76 bpm     Resting BP 120/74     Resting Oxygen Saturation  95 %     Exercise Oxygen Saturation  during 6 min walk 96 %     Max Ex. HR 114 bpm     Max Ex. BP 154/80     2 Minute Post BP 130/74            Oxygen Initial Assessment:   Oxygen Re-Evaluation:   Oxygen Discharge (Final Oxygen Re-Evaluation):   Initial Exercise Prescription:  Initial Exercise Prescription - 02/19/20 1100      Date of Initial Exercise RX and Referring Provider   Date 02/19/20    Referring Provider Chilton Si, MD    Expected Discharge Date 04/14/20      NuStep   Level 2    SPM 75    Minutes 15    METs 1.8      Track   Laps 12    Minutes 15    METs 2.39      Prescription Details   Frequency (times per week) 3    Duration Progress to 30 minutes of continuous aerobic without signs/symptoms of physical distress      Intensity   THRR 40-80% of Max Heartrate 61-122    Ratings of Perceived Exertion 11-13    Perceived Dyspnea 0-4      Resistance Training   Training Prescription Yes    Weight 3 lbs           Perform Capillary Blood Glucose checks as needed.  Exercise Prescription Changes:   Exercise Prescription Changes    Row Name 02/25/20 1300 03/10/20 1200 03/17/20 1030         Response to Exercise   Blood Pressure (Admit) 120/66 138/82 130/70     Blood Pressure (Exercise) 128/74 122/80 118/78     Blood Pressure (Exit) 110/78  112/60 108/70     Heart Rate (Admit) 89 bpm 90 bpm 90 bpm     Heart Rate (Exercise) 105 bpm 118  bpm 122 bpm     Heart Rate (Exit) 89 bpm 100 bpm 92 bpm     Rating of Perceived Exertion (Exercise) 11 12 12      Perceived Dyspnea (Exercise) 0 0 0     Symptoms None None None     Comments Pt's first day of exercise Reviewed Home Exercise None     Duration Progress to 30 minutes of  aerobic without signs/symptoms of physical distress Continue with 30 min of aerobic exercise without signs/symptoms of physical distress. Continue with 30 min of aerobic exercise without signs/symptoms of physical distress.     Intensity THRR unchanged THRR unchanged THRR unchanged       Progression   Progression Continue to progress workloads to maintain intensity without signs/symptoms of physical distress. Continue to progress workloads to maintain intensity without signs/symptoms of physical distress. Continue to progress workloads to maintain intensity without signs/symptoms of physical distress.     Average METs 1.9 3.01 3.7       Resistance Training   Training Prescription No No No       Interval Training   Interval Training No -- --       NuStep   Level 2 4 5      SPM 75 90 95     Minutes 15 15 15      METs 1.9 2.7 3.6       Track   Laps 14 20 24      Minutes 15 15 15      METs 2.62 3.32 3.78       Home Exercise Plan   Plans to continue exercise at -- Home (comment)  Walking Home (comment)  Walking     Frequency -- Add 3 additional days to program exercise sessions. Add 3 additional days to program exercise sessions.     Initial Home Exercises Provided -- 03/10/20 03/10/20            Exercise Comments:   Exercise Comments    Row Name 02/25/20 1321 03/10/20 1212         Exercise Comments Pt's first day of exercise. Pt responded well to exercise prescription and was oriented to exercise equipment. Will continue to monitor and progress as tolerated. Reviewed Home Exercise Plan with pt verbally.  Pt cannot read, so the handout was not given to pt. Pt is walking 5-6 days a week for 30 minutes. Pt states he walks in the morning to start his day. Will continue to monitor pt's progress.             Exercise Goals and Review:   Exercise Goals    Row Name 02/19/20 1122             Exercise Goals   Increase Physical Activity Yes       Intervention Provide advice, education, support and counseling about physical activity/exercise needs.;Develop an individualized exercise prescription for aerobic and resistive training based on initial evaluation findings, risk stratification, comorbidities and participant's personal goals.       Expected Outcomes Short Term: Attend rehab on a regular basis to increase amount of physical activity.;Long Term: Add in home exercise to make exercise part of routine and to increase amount of physical activity.;Long Term: Exercising regularly at least 3-5 days a week.       Increase Strength and Stamina Yes       Intervention Provide advice, education, support and counseling about physical activity/exercise needs.;Develop an individualized exercise prescription for aerobic and resistive training based  on initial evaluation findings, risk stratification, comorbidities and participant's personal goals.       Expected Outcomes Short Term: Increase workloads from initial exercise prescription for resistance, speed, and METs.;Short Term: Perform resistance training exercises routinely during rehab and add in resistance training at home;Long Term: Improve cardiorespiratory fitness, muscular endurance and strength as measured by increased METs and functional capacity ( )       Able to understand and use rate of perceived exertion (RPE) scale Yes       Intervention Provide education and explanation on how to use RPE scale       Expected Outcomes Short Term: Able to use RPE daily in rehab to express subjective intensity level;Long Term:  Able to use RPE to guide intensity  level when exercising independently       Knowledge and understanding of Target Heart Rate Range (THRR) Yes       Intervention Provide education and explanation of THRR including how the numbers were predicted and where they are located for reference       Expected Outcomes Short Term: Able to state/look up THRR;Long Term: Able to use THRR to govern intensity when exercising independently;Short Term: Able to use daily as guideline for intensity in rehab       Able to check pulse independently Yes       Intervention Provide education and demonstration on how to check pulse in carotid and radial arteries.;Review the importance of being able to check your own pulse for safety during independent exercise       Expected Outcomes Short Term: Able to explain why pulse checking is important during independent exercise;Long Term: Able to check pulse independently and accurately       Understanding of Exercise Prescription Yes       Intervention Provide education, explanation, and written materials on patient's individual exercise prescription       Expected Outcomes Short Term: Able to explain program exercise prescription;Long Term: Able to explain home exercise prescription to exercise independently              Exercise Goals Re-Evaluation :  Exercise Goals Re-Evaluation    Row Name 02/25/20 1321 03/10/20 1216           Exercise Goal Re-Evaluation   Exercise Goals Review Increase Physical Activity;Able to understand and use rate of perceived exertion (RPE) scale;Knowledge and understanding of Target Heart Rate Range (THRR);Understanding of Exercise Prescription Increase Physical Activity;Able to understand and use rate of perceived exertion (RPE) scale;Knowledge and understanding of Target Heart Rate Range (THRR);Understanding of Exercise Prescription;Increase Strength and Stamina;Able to check pulse independently      Comments Today was pt's first day of exercise. Pt able to exercise 30 minutes  with no difficulty. Will follow up with pt regarding plans for home exercise. Reviewed HEP with pt. Also discussed THRR, RPE Scale, weather conditions, endpoints of exercise, NTG use, warmup and cool down.      Expected Outcomes Pt will continue to increase strength and stamina. Pt will continue to exercise most days of the week, by walking for 30 minutes.              Discharge Exercise Prescription (Final Exercise Prescription Changes):  Exercise Prescription Changes - 03/17/20 1030      Response to Exercise   Blood Pressure (Admit) 130/70    Blood Pressure (Exercise) 118/78    Blood Pressure (Exit) 108/70    Heart Rate (Admit) 90 bpm    Heart Rate (  Exercise) 122 bpm    Heart Rate (Exit) 92 bpm    Rating of Perceived Exertion (Exercise) 12    Perceived Dyspnea (Exercise) 0    Symptoms None    Comments None    Duration Continue with 30 min of aerobic exercise without signs/symptoms of physical distress.    Intensity THRR unchanged      Progression   Progression Continue to progress workloads to maintain intensity without signs/symptoms of physical distress.    Average METs 3.7      Resistance Training   Training Prescription No      NuStep   Level 5    SPM 95    Minutes 15    METs 3.6      Track   Laps 24    Minutes 15    METs 3.78      Home Exercise Plan   Plans to continue exercise at Home (comment)   Walking   Frequency Add 3 additional days to program exercise sessions.    Initial Home Exercises Provided 03/10/20           Nutrition:  Target Goals: Understanding of nutrition guidelines, daily intake of sodium 1500mg , cholesterol 200mg , calories 30% from fat and 7% or less from saturated fats, daily to have 5 or more servings of fruits and vegetables.  Biometrics:  Pre Biometrics - 02/19/20 0959      Pre Biometrics   Waist Circumference 49 inches    Hip Circumference 45 inches    Waist to Hip Ratio 1.09 %    Triceps Skinfold 21 mm    % Body Fat  36.3 %    Grip Strength 42 kg    Flexibility 13.5 in    Single Leg Stand 20.62 seconds            Nutrition Therapy Plan and Nutrition Goals:  Nutrition Therapy & Goals - 03/16/20 1441      Nutrition Therapy   Diet Heart healthy    Drug/Food Interactions Statins/Certain Fruits      Personal Nutrition Goals   Nutrition Goal Pt to reduce sodium intake by avoiding salt shaker    Personal Goal #2 Pt to build a healthy plate including vegetables, fruits, whole grains, and low-fat dairy products in a heart healthy meal plan.    Personal Goal #3 Pt able to name foods that affect blood glucose      Intervention Plan   Intervention Prescribe, educate and counsel regarding individualized specific dietary modifications aiming towards targeted core components such as weight, hypertension, lipid management, diabetes, heart failure and other comorbidities.    Expected Outcomes Short Term Goal: Understand basic principles of dietary content, such as calories, fat, sodium, cholesterol and nutrients.           Nutrition Assessments:   Nutrition Goals Re-Evaluation:  Nutrition Goals Re-Evaluation    Row Name 03/16/20 1442             Goals   Current Weight 204 lb 5.9 oz (92.7 kg)       Nutrition Goal Pt to reduce sodium intake by avoiding salt shaker         Personal Goal #2 Re-Evaluation   Personal Goal #2 Pt to build a healthy plate including vegetables, fruits, whole grains, and low-fat dairy products in a heart healthy meal plan.         Personal Goal #3 Re-Evaluation   Personal Goal #3 Pt able to name foods that affect blood glucose  Nutrition Goals Discharge (Final Nutrition Goals Re-Evaluation):  Nutrition Goals Re-Evaluation - 03/16/20 1442      Goals   Current Weight 204 lb 5.9 oz (92.7 kg)    Nutrition Goal Pt to reduce sodium intake by avoiding salt shaker      Personal Goal #2 Re-Evaluation   Personal Goal #2 Pt to build a healthy plate including  vegetables, fruits, whole grains, and low-fat dairy products in a heart healthy meal plan.      Personal Goal #3 Re-Evaluation   Personal Goal #3 Pt able to name foods that affect blood glucose           Psychosocial: Target Goals: Acknowledge presence or absence of significant depression and/or stress, maximize coping skills, provide positive support system. Participant is able to verbalize types and ability to use techniques and skills needed for reducing stress and depression.  Initial Review & Psychosocial Screening:  Initial Psych Review & Screening - 02/19/20 1319      Initial Review   Current issues with None Identified      Family Dynamics   Good Support System? Yes    Comments Mr. Rana presents to cardiac rehab orientation with a positive attitude and outlook. He is married with 2 children and 3 grandchildren who live locally. His support system is intact. States he "loves living life". No identifiable psychosocial barriers to participation in cardiac rehab. No interventions needed.      Barriers   Psychosocial barriers to participate in program There are no identifiable barriers or psychosocial needs.      Screening Interventions   Interventions Encouraged to exercise           Quality of Life Scores:  Quality of Life - 02/25/20 1331      Quality of Life   Select Quality of Life      Quality of Life Scores   Health/Function Pre 14 %    Socioeconomic Pre 24 %    Psych/Spiritual Pre 23.14 %    Family Pre 13.2 %    GLOBAL Pre 17.64 %          Scores of 19 and below usually indicate a poorer quality of life in these areas.  A difference of  2-3 points is a clinically meaningful difference.  A difference of 2-3 points in the total score of the Quality of Life Index has been associated with significant improvement in overall quality of life, self-image, physical symptoms, and general health in studies assessing change in quality of life.  PHQ-9: Recent Review  Flowsheet Data    Depression screen [facility] 2/9 02/19/2020 02/10/2019 01/30/2018   Decreased Interest 0 0 0   Down, Depressed, Hopeless 0 0 0   PHQ - 2 Score 0 0 0     Interpretation of Total Score  Total Score Depression Severity:  1-4 = Minimal depression, 5-9 = Mild depression, 10-14 = Moderate depression, 15-19 = Moderately severe depression, 20-27 = Severe depression   Psychosocial Evaluation and Intervention:   Psychosocial Re-Evaluation:  Psychosocial Re-Evaluation    Row Name 03/17/20 1211             Psychosocial Re-Evaluation   Current issues with None Identified       Interventions Encouraged to attend Cardiac Rehabilitation for the exercise       Continue Psychosocial Services  No Follow up required              Psychosocial Discharge (Final Psychosocial Re-Evaluation):  Psychosocial  Re-Evaluation - 03/17/20 1211      Psychosocial Re-Evaluation   Current issues with None Identified    Interventions Encouraged to attend Cardiac Rehabilitation for the exercise    Continue Psychosocial Services  No Follow up required           Vocational Rehabilitation: Provide vocational rehab assistance to qualifying candidates.   Vocational Rehab Evaluation & Intervention:   Education: Education Goals: Education classes will be provided on a weekly basis, covering required topics. Participant will state understanding/return demonstration of topics presented.  Learning Barriers/Preferences:  Learning Barriers/Preferences - 02/19/20 1108      Learning Barriers/Preferences   Learning Barriers Reading;Sight   Wears glasses   Learning Preferences Individual Instruction;Skilled Demonstration           Education Topics: Hypertension, Hypertension Reduction -Define heart disease and high blood pressure. Discus how high blood pressure affects the body and ways to reduce high blood pressure.   Exercise and Your Heart -Discuss why it is important to exercise, the FITT  principles of exercise, normal and abnormal responses to exercise, and how to exercise safely.   Angina -Discuss definition of angina, causes of angina, treatment of angina, and how to decrease risk of having angina.   Cardiac Medications -Review what the following cardiac medications are used for, how they affect the body, and side effects that may occur when taking the medications.  Medications include Aspirin, Beta blockers, calcium channel blockers, ACE Inhibitors, angiotensin receptor blockers, diuretics, digoxin, and antihyperlipidemics.   Congestive Heart Failure -Discuss the definition of CHF, how to live with CHF, the signs and symptoms of CHF, and how keep track of weight and sodium intake.   Heart Disease and Intimacy -Discus the effect sexual activity has on the heart, how changes occur during intimacy as we age, and safety during sexual activity.   Smoking Cessation / COPD -Discuss different methods to quit smoking, the health benefits of quitting smoking, and the definition of COPD.   Nutrition I: Fats -Discuss the types of cholesterol, what cholesterol does to the heart, and how cholesterol levels can be controlled.   Nutrition II: Labels -Discuss the different components of food labels and how to read food label   Heart Parts/Heart Disease and PAD -Discuss the anatomy of the heart, the pathway of blood circulation through the heart, and these are affected by heart disease.   Stress I: Signs and Symptoms -Discuss the causes of stress, how stress may lead to anxiety and depression, and ways to limit stress.   Stress II: Relaxation -Discuss different types of relaxation techniques to limit stress.   Warning Signs of Stroke / TIA -Discuss definition of a stroke, what the signs and symptoms are of a stroke, and how to identify when someone is having stroke.   Knowledge Questionnaire Score:  Knowledge Questionnaire Score - 02/25/20 1331      Knowledge  Questionnaire Score   Pre Score 17/24           Core Components/Risk Factors/Patient Goals at Admission:  Personal Goals and Risk Factors at Admission - 02/19/20 1110      Core Components/Risk Factors/Patient Goals on Admission    Weight Management Yes;Obesity;Weight Loss    Admit Weight 208 lb 8.9 oz (94.6 kg)    Expected Outcomes Short Term: Continue to assess and modify interventions until short term weight is achieved;Long Term: Adherence to nutrition and physical activity/exercise program aimed toward attainment of established weight goal;Weight Maintenance: Understanding of the daily nutrition  guidelines, which includes 25-35% calories from fat, 7% or less cal from saturated fats, less than 200mg  cholesterol, less than 1.5gm of sodium, & 5 or more servings of fruits and vegetables daily;Weight Loss: Understanding of general recommendations for a balanced deficit meal plan, which promotes 1-2 lb weight loss per week and includes a negative energy balance of [phone] kcal/d;Understanding recommendations for meals to include 15-35% energy as protein, 25-35% energy from fat, 35-60% energy from carbohydrates, less than 200mg  of dietary cholesterol, 20-35 gm of total fiber daily;Understanding of distribution of calorie intake throughout the day with the consumption of 4-5 meals/snacks    Diabetes Yes    Intervention Provide education about signs/symptoms and action to take for hypo/hyperglycemia.;Provide education about proper nutrition, including hydration, and aerobic/resistive exercise prescription along with prescribed medications to achieve blood glucose in normal ranges: Fasting glucose 65-99 mg/dL    Expected Outcomes Short Term: Participant verbalizes understanding of the signs/symptoms and immediate care of hyper/hypoglycemia, proper foot care and importance of medication, aerobic/resistive exercise and nutrition plan for blood glucose control.;Long Term: Attainment of HbA1C < 7%.    Heart  Failure Yes    Intervention Provide a combined exercise and nutrition program that is supplemented with education, support and counseling about heart failure. Directed toward relieving symptoms such as shortness of breath, decreased exercise tolerance, and extremity edema.    Expected Outcomes Improve functional capacity of life;Short term: Attendance in program 2-3 days a week with increased exercise capacity. Reported lower sodium intake. Reported increased fruit and vegetable intake. Reports medication compliance.;Long term: Adoption of self-care skills and reduction of barriers for early signs and symptoms recognition and intervention leading to self-care maintenance.;Short term: Daily weights obtained and reported for increase. Utilizing diuretic protocols set by physician.    Hypertension Yes    Intervention Provide education on lifestyle modifcations including regular physical activity/exercise, weight management, moderate sodium restriction and increased consumption of fresh fruit, vegetables, and low fat dairy, alcohol moderation, and smoking cessation.;Monitor prescription use compliance.    Expected Outcomes Short Term: Continued assessment and intervention until BP is < 140/5890mm HG in hypertensive participants. < 130/4680mm HG in hypertensive participants with diabetes, heart failure or chronic kidney disease.;Long Term: Maintenance of blood pressure at goal levels.    Lipids Yes    Intervention Provide education and support for participant on nutrition & aerobic/resistive exercise along with prescribed medications to achieve LDL 70mg , HDL >40mg .    Expected Outcomes Short Term: Participant states understanding of desired cholesterol values and is compliant with medications prescribed. Participant is following exercise prescription and nutrition guidelines.;Long Term: Cholesterol controlled with medications as prescribed, with individualized exercise RX and with personalized nutrition plan. Value  goals: LDL < 70mg , HDL > 40 mg.           Core Components/Risk Factors/Patient Goals Review:   Goals and Risk Factor Review    Row Name 03/17/20 1212             Core Components/Risk Factors/Patient Goals Review   Personal Goals Review Weight Management/Obesity;Heart Failure;Hypertension;Lipids;Diabetes       Review Alvah's vital signs have been stable. Kree's diabetes is diet controlled he does not check his CBG's at home. Celene KrasClaude is doing well with exercise.       Expected Outcomes Celene KrasClaude will continue to participate in phase 2 cardiac rehab for exercise, nutrtion and lifestyle modifications.              Core Components/Risk Factors/Patient Goals at Discharge (Final  Review):   Goals and Risk Factor Review - 03/17/20 1212      Core Components/Risk Factors/Patient Goals Review   Personal Goals Review Weight Management/Obesity;Heart Failure;Hypertension;Lipids;Diabetes    Review Caliber's vital signs have been stable. Pax's diabetes is diet controlled he does not check his CBG's at home. Gohan is doing well with exercise.    Expected Outcomes Kourtney will continue to participate in phase 2 cardiac rehab for exercise, nutrtion and lifestyle modifications.           ITP Comments:  ITP Comments    Row Name 02/19/20 0957 03/17/20 1210         ITP Comments Dr. Armanda Magic Medical Director Cardiac Rehab [place] 30 Day ITP Review. Marshell has good attendance and participation in phase 2 cardiac rehab             Comments: See ITP Comments.Gladstone Lighter, RN,BSN 03/18/2020 12:01 PM

## 2020-03-19 ENCOUNTER — Other Ambulatory Visit: Payer: Self-pay

## 2020-03-19 ENCOUNTER — Encounter (HOSPITAL_COMMUNITY)
Admission: RE | Admit: 2020-03-19 | Discharge: 2020-03-19 | Disposition: A | Discharge status: Home / Self Care | Payer: Medicare Other | Source: Ambulatory Visit | Attending: Cardiovascular Disease | Admitting: Cardiovascular Disease

## 2020-03-19 DIAGNOSIS — I5022 Chronic systolic (congestive) heart failure: Secondary | ICD-10-CM

## 2020-03-22 ENCOUNTER — Other Ambulatory Visit: Payer: Self-pay

## 2020-03-22 ENCOUNTER — Encounter (HOSPITAL_COMMUNITY)
Admission: RE | Admit: 2020-03-22 | Discharge: 2020-03-22 | Disposition: A | Discharge status: Home / Self Care | Payer: Medicare Other | Source: Ambulatory Visit | Attending: Cardiovascular Disease | Admitting: Cardiovascular Disease

## 2020-03-22 DIAGNOSIS — I5022 Chronic systolic (congestive) heart failure: Secondary | ICD-10-CM | POA: Insufficient documentation

## 2020-03-24 ENCOUNTER — Encounter (HOSPITAL_COMMUNITY): Payer: Medicare Other

## 2020-03-25 ENCOUNTER — Other Ambulatory Visit: Payer: Self-pay | Admitting: Internal Medicine

## 2020-03-26 ENCOUNTER — Other Ambulatory Visit: Payer: Self-pay

## 2020-03-26 ENCOUNTER — Ambulatory Visit: Payer: Medicare Other | Admitting: Cardiovascular Disease

## 2020-03-26 ENCOUNTER — Encounter: Payer: Self-pay | Admitting: Cardiovascular Disease

## 2020-03-26 ENCOUNTER — Encounter (HOSPITAL_COMMUNITY): Payer: Medicare Other

## 2020-03-26 VITALS — BP 114/78 | HR 67 | Ht 66.0 in | Wt 201.0 lb

## 2020-03-26 DIAGNOSIS — I10 Essential (primary) hypertension: Secondary | ICD-10-CM | POA: Diagnosis not present

## 2020-03-26 DIAGNOSIS — Z5181 Encounter for therapeutic drug level monitoring: Secondary | ICD-10-CM | POA: Diagnosis not present

## 2020-03-26 DIAGNOSIS — E7800 Pure hypercholesterolemia, unspecified: Secondary | ICD-10-CM | POA: Diagnosis not present

## 2020-03-26 DIAGNOSIS — I2510 Atherosclerotic heart disease of native coronary artery without angina pectoris: Secondary | ICD-10-CM

## 2020-03-26 DIAGNOSIS — I255 Ischemic cardiomyopathy: Secondary | ICD-10-CM

## 2020-03-26 DIAGNOSIS — I5043 Acute on chronic combined systolic (congestive) and diastolic (congestive) heart failure: Secondary | ICD-10-CM

## 2020-03-26 LAB — COMPREHENSIVE METABOLIC PANEL
ALT: 9 IU/L (ref 0–44)
AST: 19 IU/L (ref 0–40)
Albumin/Globulin Ratio: 1.5 (ref 1.2–2.2)
Albumin: 4.3 g/dL (ref 3.8–4.8)
Alkaline Phosphatase: 86 IU/L (ref 44–121)
BUN/Creatinine Ratio: 14 (ref 10–24)
BUN: 13 mg/dL (ref 8–27)
Bilirubin Total: 0.7 mg/dL (ref 0.0–1.2)
CO2: 25 mmol/L (ref 20–29)
Calcium: 9.4 mg/dL (ref 8.6–10.2)
Chloride: 101 mmol/L (ref 96–106)
Creatinine, Ser: 0.9 mg/dL (ref 0.76–1.27)
GFR calc Af Amer: 102 mL/min/{1.73_m2} (ref >59)
GFR calc non Af Amer: 88 mL/min/{1.73_m2} (ref >59)
Globulin, Total: 2.8 g/dL (ref 1.5–4.5)
Glucose: 111 mg/dL — ABNORMAL HIGH (ref 65–99)
Potassium: 5 mmol/L (ref 3.5–5.2)
Sodium: 138 mmol/L (ref 134–144)
Total Protein: 7.1 g/dL (ref 6.0–8.5)

## 2020-03-26 LAB — LIPID PANEL
Chol/HDL Ratio: 2.8 ratio (ref 0.0–5.0)
Cholesterol, Total: 94 mg/dL — ABNORMAL LOW (ref 100–199)
HDL: 34 mg/dL — ABNORMAL LOW (ref >39)
LDL Chol Calc (NIH): 43 mg/dL (ref 0–99)
Triglycerides: 82 mg/dL (ref 0–149)
VLDL Cholesterol Cal: 17 mg/dL (ref 5–40)

## 2020-03-26 NOTE — Patient Instructions (Signed)
Medication Instructions:  Your physician recommends that you continue on your current medications as directed. Please refer to the Current Medication list given to you today.  *If you need a refill on your cardiac medications before your next appointment, please call your pharmacy*  Lab Work: LP/CMET TODAY   If you have labs (blood work) drawn today and your tests are completely normal, you will receive your results only by:  MyChart Message (if you have MyChart) OR  A paper copy in the mail If you have any lab test that is abnormal or we need to change your treatment, we will call you to review the results.  Testing/Procedures: Your physician has requested that you have an echocardiogram. Echocardiography is a painless test that uses sound waves to create images of your heart. It provides your doctor with information about the size and shape of your heart and how well your hearts chambers and valves are working. This procedure takes approximately one hour. There are no restrictions for this procedure.  PRIOR TO YOUR 3 MONTH VISIT   Follow-Up: At [facility], you and your health needs are our priority.  As part of our continuing mission to provide you with exceptional heart care, we have created designated Provider Care Teams.  These Care Teams include your primary Cardiologist (physician) and Advanced Practice Providers (APPs -  Physician Assistants and Nurse Practitioners) who all work together to provide you with the care you need, when you need it.  We recommend signing up for the patient portal called "MyChart".  Sign up information is provided on this After Visit Summary.  MyChart is used to connect with patients for Virtual Visits (Telemedicine).  Patients are able to view lab/test results, encounter notes, upcoming appointments, etc.  Non-urgent messages can be sent to your provider as well.   To learn more about what you can do with MyChart, go to [email or web address].     Your next appointment:   3 month(s)  The format for your next appointment:   In Person  Provider:   You may see Chilton Si, MD or one of the following Advanced Practice Providers on your designated Care Team:    Corine Shelter, PA-C  [place], [place]  [name], [place]

## 2020-03-26 NOTE — Progress Notes (Signed)
Cardiology Office Note   Date:  03/26/2020   ID:  Spencer Horn, DOB [birth year], MRN [id number]  PCP:  Spencer Horn, Spencer Oliver, MD  Cardiologist:   Spencer Siiffany [place], MD  Electrophysiologist: Spencer BuntingGregg Taylor, MD  Chief Complaint  Patient presents with  . Follow-up    3 months.     History of Present Illness: Spencer Horn is a 69 y.o. male with CAD s/p MI, hypertension, chronic systolic and diastolic heart failure s/p ICD, erectile dysfunction, COPD, and hyperlipidemia who presents for follow up.  [name] was previously a patient of Dr. Patty Horn.  He had an MI in 2004 and 06/2010.  The second MI was treated with DES to the LAD.  He then had LVEF 35% and subsequently underwent implantation of an ICD.  He continues to complain of fatigue.  Blood counts, thyroid function, vitamin D and testosterone levels were all within normal limits.  [name] has struggled with exertional dyspnea.  His wife notes that he snores loudly but he has been persistently unwilling to get a sleep study.  He reports that he would not wear CPAP and therefore does not want to waste money or time on a test.  He was referred for PFTs that showed severe COPD and emphysema.  Entresto was switched back to losartan due to cost and the fact that his symptoms hadn't improved.  He was referred to Dr. Marchelle Horn and was started on Spiriva.  The Spiriva was not helpful so he stopped using it.  He does use the albuterol rescue inhaler as needed.  He has been unable to afford most inhalers.  He reports that they make just a little too much to qualify for patient assistance programs.   [name] complained of increasing fatigue and exertional dyspnea.  He was referred for an echo 12/2019 that revealed LVEF 15 to 20%.  There is global hypokinesis with apical akinesis.  LV was moderately dilated.  He had grade 1 diastolic dysfunction and normal right ventricular function.  There is no significant valvular abnormality.  He had a Lexiscan Myoview at  the same time that revealed LVEF 30% with prior apical infarct and no ischemia.  At his appointment 02/2020 [facility]  He was also referred to cardiac rehab.  He has been Participating in cardiac rehab 3 days/week.  He also walks for 30 minutes on his nonrehab days.  He is feeling great and has no chest pain.  His breathing has improved.  He denies any lower extremity edema, orthopnea, or PND.  He has been trying to have smaller portions and has been able to lose about 10 pounds.  He is also been limiting the beef in his diet.  He is excited that his clothes seem to be fitting better.   Past Medical History:  Diagnosis Date  . Acute on chronic combined systolic and diastolic CHF (congestive heart failure) (HCC) 01/27/2020  . Coronary artery disease   . Dyslipidemia   . Erectile dysfunction 08/18/2016  . Fatigue 12/18/2019  . History of acute anterior wall MI   . Hyperlipidemia   . ICD (implantable cardiac defibrillator) in place June 2012  . Ischemic cardiomyopathy   . Personal history of MRSA (methicillin resistant Staphylococcus aureus)   . Tobacco abuse   . Ventricular dysfunction 07/07/2010   Severe left ventricular dysfunction with ejection fraction of 30-35%    Past Surgical History:  Procedure Laterality Date  . CARDIAC CATHETERIZATION  07/07/2010  DES to proximal LAD  . CARDIAC CATHETERIZATION  2004  . EP IMPLANTABLE DEVICE  June 2012     Current Outpatient Medications  Medication Sig Dispense Refill  . aspirin 81 MG tablet Take 81 mg by mouth daily.      [name] atorvastatin (LIPITOR) 40 MG tablet Take 1 tablet (40 mg total) by mouth daily. Please keep upcoming appt in December with Dr. Ladona Horn for future refills. Thank you 90 tablet 0  . carvedilol (COREG) 3.125 MG tablet Take 1 tablet by mouth twice daily 180 tablet 3  . fluticasone (FLONASE) 50 MCG/ACT nasal spray Place 2 sprays into both nostrils daily. 16 g 6  . Multiple Vitamin (MULTIVITAMIN) tablet  Take 1 tablet by mouth daily.    . nitroGLYCERIN (NITROSTAT) 0.4 MG SL tablet Place 1 tablet (0.4 mg total) under the tongue every 5 (five) minutes as needed for chest pain (for chest pain). 25 tablet 6  . ondansetron (ZOFRAN ODT) 4 MG disintegrating tablet Take 1 tablet (4 mg total) by mouth every 8 (eight) hours as needed for nausea or vomiting. 3 tablet 0  . sacubitril-valsartan (ENTRESTO) 24-26 MG Take 1 tablet by mouth 2 (two) times daily. 180 tablet 3  . spironolactone (ALDACTONE) 25 MG tablet TAKE 1/2 (ONE-HALF) TABLET BY MOUTH ONCE DAILY . (KEEP OFFICE VISIT) 45 tablet 2   No current facility-administered medications for this visit.    Allergies:   Patient has no known allergies.    Social History:  The patient  reports that he quit smoking about 9 years ago. His smoking use included cigarettes. He started smoking about 49 years ago. He has a 40.00 pack-year smoking history. He has never used smokeless tobacco. He reports that he does not drink alcohol and does not use drugs.   Family History:  The patient's family history includes Bladder Cancer in his father; Bone cancer in his mother; Diabetes in his father; Hyperlipidemia in his father; Hypertension in his mother.    ROS:  Please see the history of present illness.   Otherwise, review of systems are positive for fatigue, diaphoresis.   All other systems are reviewed and negative.    PHYSICAL EXAM: VS:  BP 114/78 (BP Location: Left Arm, Patient Position: Sitting, Cuff Size: Normal)   Pulse 67   Ht 5\' 6"  (1.676 m)   Wt 201 lb (91.2 kg)   BMI 32.44 kg/m  , BMI Body mass index is 32.44 kg/m. GENERAL:  Well appearing HEENT: Pupils equal round and reactive, fundi not visualized, oral mucosa unremarkable NECK:  No jugular venous distention, waveform within normal limits, carotid upstroke brisk and symmetric, no bruits LUNGS:  Clear to auscultation bilaterally HEART:  RRR.  PMI not displaced or sustained,S1 and S2 within normal  limits, no S3, no S4, no clicks, no rubs, no murmurs ABD:  Flat, positive bowel sounds normal in frequency in pitch, no bruits, no rebound, no guarding, no midline pulsatile mass, no hepatomegaly, no splenomegaly EXT:  2 plus pulses throughout, no edema, no cyanosis no clubbing SKIN:  No rashes no nodules NEURO:  Cranial nerves II through XII grossly intact, motor grossly intact throughout PSYCH:  Cognitively intact, oriented to person place and time   EKG:  EKG is not ordered today. The ekg ordered 10/12/15 demonstrates sinus rhythm rate 68 bpm.  Low voltage limb leads and precordial leads.  Prior anteroseptal infarct.   08/16/16: sinus rhythm.  Rate 76 bpm.  Prior anteroseptal infarct.  Lateral TWI.  04/17/17: Sinus rhythm.  Rate 74 bpm.  Prior septal infarct.   Sinus rhythm.  Rate 66 bpm.  Prior septal infarct.  Anterolateral T wave inversions unchanged from prior. 12/18/2019: Sinus rhythm.  Rate 78 bpm.  Prior anterior infarct.  Lateral T wave inversions.  Echo 06/18/15: Study Conclusions  - Left ventricle: LVEF is severely depressed at approximately 35 to  40% with severe hypokinesis of the mid/distal lateral, mid/distal  septal, distal inferior, apical wals. The cavity size was normal. - Ventricular septum: The contour showed systolic flattening.   Echo 01/02/20: 1. Left ventricular ejection fraction, by estimation, is 15-20%. The left  ventricle has severely decreased function. The left ventricle demonstrates  global hypokinesis with apical akinesis. The left ventricular internal  cavity size was moderately  dilated. Left ventricular diastolic parameters are consistent with Grade I  diastolic dysfunction (impaired relaxation).  2. Right ventricular systolic function is normal. The right ventricular  size is normal. There is mildly elevated pulmonary artery systolic  pressure.  3. Left atrial size was mildly dilated.  4. The mitral valve is normal in structure. Trivial  mitral valve  regurgitation.  5. The aortic valve is normal in structure. Aortic valve regurgitation is  trivial. Mild aortic valve sclerosis is present, with no evidence of  aortic valve stenosis.  6. Aortic dilatation noted. Aneurysm of the aortic root.  7. No evidence of LV thrombus with Definity contrast injection.   Lexiscan Myoview 12/2019:  Nuclear stress EF: 30%.  There was no ST segment deviation noted during stress.  Defect 1: There is a medium defect of severe severity present in the apex location.  Findings consistent with prior myocardial infarction.  This is a high risk study.  The left ventricular ejection fraction is moderately decreased (30-44%).   Abnormal, high risk stress nuclear study with prior apical infarct but no ischemia.  Gated ejection fraction was 30% with global hypokinesis worse at the apex.  Moderate left ventricular enlargement.  Echo 01/02/2020: 1. Left ventricular ejection fraction, by estimation, is 15-20%. The left  ventricle has severely decreased function. The left ventricle demonstrates  global hypokinesis with apical akinesis. The left ventricular internal  cavity size was moderately  dilated. Left ventricular diastolic parameters are consistent with Grade I  diastolic dysfunction (impaired relaxation).  2. Right ventricular systolic function is normal. The right ventricular  size is normal. There is mildly elevated pulmonary artery systolic  pressure.  3. Left atrial size was mildly dilated.  4. The mitral valve is normal in structure. Trivial mitral valve  regurgitation.  5. The aortic valve is normal in structure. Aortic valve regurgitation is  trivial. Mild aortic valve sclerosis is present, with no evidence of  aortic valve stenosis.  6. Aortic dilatation noted. Aneurysm of the aortic root.  7. No evidence of LV thrombus with Definity contrast injection.   Lexiscan Myoview 12/2019:   Nuclear stress EF: 30%.  There  was no ST segment deviation noted during stress.  Defect 1: There is a medium defect of severe severity present in the apex location.  Findings consistent with prior myocardial infarction.  This is a high risk study.  The left ventricular ejection fraction is moderately decreased (30-44%).   Abnormal, high risk stress nuclear study with prior apical infarct but no ischemia.  Gated ejection fraction was 30% with global hypokinesis worse at the apex.  Moderate left ventricular enlargement.   Recent Labs: 12/18/2019: ALT 14; Hemoglobin 15.6; Platelets 185; TSH 1.090 02/05/2020: BUN  16; Creatinine, Ser 0.85; Potassium 5.1; Sodium 141    Lipid Panel    Component Value Date/Time   CHOL 116 12/18/2019 0959   TRIG 180 (H) 12/18/2019 0959   HDL 38 (L) 12/18/2019 0959   CHOLHDL 3.1 12/18/2019 0959   CHOLHDL 3.3 04/11/2016 1035   VLDL 24 04/11/2016 1035   LDLCALC 48 12/18/2019 0959       Wt Readings from Last 3 Encounters:  03/26/20 201 lb (91.2 kg)  02/19/20 208 lb 8.9 oz (94.6 kg)  01/27/20 211 lb 6.4 oz (95.9 kg)      ASSESSMENT AND PLAN:  # Chronic systolic and diastolic heart failure:   LVEF continues to decline.  Most recent echo reveals LVEF 15 to 20%.  He does have an ICD in place.  He is now on Entresto.  He is participating in cardiac rehab and doing great.  Stress test showed old infarct but no ischemia.  He was congratulated on his dietary and exercise changes.  Repeat echo in 3 months.   # CAD s/p MI: Stable.  No angina.  He has fatigue after exertion as above.  We will get a Lexiscan Myoview to assess for ischemia.  Continue aspirin, carvedilol, and atorvastatin.LDL 48.  Triglycerides are elevated.  Repeat lipids and a CMP.  If triglycerides are elevated we will need to consider Vascepa  # Hypertension: BP well-controlled.  Continue carvedilol, Entresto, and spironolactone.  # Fatigue: # Snorning, daytime somnolence: He continues to be uninterested in a sleep study  or CPAP machine.  Current medicines are reviewed at length with the patient today.  The patient does not have concerns regarding medicines.  The following changes have been made: Switch losartan to Entresto.  Labs/ tests ordered today include:   Orders Placed This Encounter  Procedures  . Lipid panel  . Comprehensive metabolic panel  . ECHOCARDIOGRAM COMPLETE     Disposition:   FU with Malaisha Silliman C. Duke Salvia, MD, [facility] in 3 months.    Signed, Diany Formosa C. Duke Salvia, MD, [facility]  03/26/2020 1:07 PM    [facility]

## 2020-03-29 ENCOUNTER — Encounter (HOSPITAL_COMMUNITY)
Admission: RE | Admit: 2020-03-29 | Discharge: 2020-03-29 | Disposition: A | Discharge status: Home / Self Care | Payer: Medicare Other | Source: Ambulatory Visit | Attending: Cardiovascular Disease | Admitting: Cardiovascular Disease

## 2020-03-29 ENCOUNTER — Other Ambulatory Visit: Payer: Self-pay

## 2020-03-29 DIAGNOSIS — I5022 Chronic systolic (congestive) heart failure: Secondary | ICD-10-CM | POA: Diagnosis not present

## 2020-03-31 ENCOUNTER — Encounter (HOSPITAL_COMMUNITY)
Admission: RE | Admit: 2020-03-31 | Discharge: 2020-03-31 | Disposition: A | Discharge status: Home / Self Care | Payer: Medicare Other | Source: Ambulatory Visit | Attending: Cardiovascular Disease | Admitting: Cardiovascular Disease

## 2020-03-31 ENCOUNTER — Other Ambulatory Visit: Payer: Self-pay

## 2020-03-31 DIAGNOSIS — I5022 Chronic systolic (congestive) heart failure: Secondary | ICD-10-CM | POA: Diagnosis not present

## 2020-04-02 ENCOUNTER — Other Ambulatory Visit: Payer: Self-pay

## 2020-04-02 ENCOUNTER — Encounter (HOSPITAL_COMMUNITY)
Admission: RE | Admit: 2020-04-02 | Discharge: 2020-04-02 | Disposition: A | Discharge status: Home / Self Care | Payer: Medicare Other | Source: Ambulatory Visit | Attending: Cardiovascular Disease | Admitting: Cardiovascular Disease

## 2020-04-02 DIAGNOSIS — I5022 Chronic systolic (congestive) heart failure: Secondary | ICD-10-CM | POA: Diagnosis not present

## 2020-04-05 ENCOUNTER — Encounter (HOSPITAL_COMMUNITY)
Admission: RE | Admit: 2020-04-05 | Discharge: 2020-04-05 | Disposition: A | Discharge status: Home / Self Care | Payer: Medicare Other | Source: Ambulatory Visit | Attending: Cardiovascular Disease | Admitting: Cardiovascular Disease

## 2020-04-05 ENCOUNTER — Other Ambulatory Visit: Payer: Self-pay

## 2020-04-05 DIAGNOSIS — I5022 Chronic systolic (congestive) heart failure: Secondary | ICD-10-CM | POA: Diagnosis not present

## 2020-04-07 ENCOUNTER — Other Ambulatory Visit: Payer: Self-pay

## 2020-04-07 ENCOUNTER — Encounter (HOSPITAL_COMMUNITY)
Admission: RE | Admit: 2020-04-07 | Discharge: 2020-04-07 | Disposition: A | Discharge status: Home / Self Care | Payer: Medicare Other | Source: Ambulatory Visit | Attending: Cardiovascular Disease | Admitting: Cardiovascular Disease

## 2020-04-07 DIAGNOSIS — I5022 Chronic systolic (congestive) heart failure: Secondary | ICD-10-CM | POA: Diagnosis not present

## 2020-04-09 ENCOUNTER — Encounter (HOSPITAL_COMMUNITY)
Admission: RE | Admit: 2020-04-09 | Discharge: 2020-04-09 | Disposition: A | Discharge status: Home / Self Care | Payer: Medicare Other | Source: Ambulatory Visit | Attending: Cardiovascular Disease | Admitting: Cardiovascular Disease

## 2020-04-09 ENCOUNTER — Other Ambulatory Visit: Payer: Self-pay

## 2020-04-09 DIAGNOSIS — I5022 Chronic systolic (congestive) heart failure: Secondary | ICD-10-CM

## 2020-04-12 ENCOUNTER — Encounter (HOSPITAL_COMMUNITY)
Admission: RE | Admit: 2020-04-12 | Discharge: 2020-04-12 | Disposition: A | Discharge status: Home / Self Care | Payer: Medicare Other | Source: Ambulatory Visit | Attending: Cardiovascular Disease | Admitting: Cardiovascular Disease

## 2020-04-12 ENCOUNTER — Other Ambulatory Visit: Payer: Self-pay

## 2020-04-12 DIAGNOSIS — I5022 Chronic systolic (congestive) heart failure: Secondary | ICD-10-CM

## 2020-04-13 NOTE — Progress Notes (Signed)
Cardiac Individual Treatment Plan  Patient Details  Name: Spencer Horn MRN: [id number] Date of Birth: 07-12-1950 Referring Provider:     CARDIAC REHAB PHASE II ORIENTATION from 02/19/2020 in [facility] CARDIAC REHAB  Referring Provider Chilton Si, MD      Initial Encounter Date:    CARDIAC REHAB PHASE II ORIENTATION from 02/19/2020 in [facility] CARDIAC REHAB  Date 02/19/20      Visit Diagnosis: Chronic systolic heart failure (HCC)  Patient's Home Medications on Admission:  Current Outpatient Medications:  .  aspirin 81 MG tablet, Take 81 mg by mouth daily.  , Disp: , Rfl:  .  atorvastatin (LIPITOR) 40 MG tablet, Take 1 tablet (40 mg total) by mouth daily. Please keep upcoming appt in December with Dr. Ladona Ridgel for future refills. Thank you, Disp: 90 tablet, Rfl: 0 .  carvedilol (COREG) 3.125 MG tablet, Take 1 tablet by mouth twice daily, Disp: 180 tablet, Rfl: 3 .  fluticasone (FLONASE) 50 MCG/ACT nasal spray, Place 2 sprays into both nostrils daily., Disp: 16 g, Rfl: 6 .  Multiple Vitamin (MULTIVITAMIN) tablet, Take 1 tablet by mouth daily., Disp: , Rfl:  .  nitroGLYCERIN (NITROSTAT) 0.4 MG SL tablet, Place 1 tablet (0.4 mg total) under the tongue every 5 (five) minutes as needed for chest pain (for chest pain)., Disp: 25 tablet, Rfl: 6 .  ondansetron (ZOFRAN ODT) 4 MG disintegrating tablet, Take 1 tablet (4 mg total) by mouth every 8 (eight) hours as needed for nausea or vomiting., Disp: 3 tablet, Rfl: 0 .  sacubitril-valsartan (ENTRESTO) 24-26 MG, Take 1 tablet by mouth 2 (two) times daily., Disp: 180 tablet, Rfl: 3 .  spironolactone (ALDACTONE) 25 MG tablet, TAKE 1/2 (ONE-HALF) TABLET BY MOUTH ONCE DAILY . (KEEP OFFICE VISIT), Disp: 45 tablet, Rfl: 2  Past Medical History: Past Medical History:  Diagnosis Date  . Acute on chronic combined systolic and diastolic CHF (congestive heart failure) (HCC) 01/27/2020  . Coronary artery disease   .  Dyslipidemia   . Erectile dysfunction 08/18/2016  . Fatigue 12/18/2019  . History of acute anterior wall MI   . Hyperlipidemia   . ICD (implantable cardiac defibrillator) in place June 2012  . Ischemic cardiomyopathy   . Personal history of MRSA (methicillin resistant Staphylococcus aureus)   . Tobacco abuse   . Ventricular dysfunction 07/07/2010   Severe left ventricular dysfunction with ejection fraction of 30-35%    Tobacco Use: Social History   Tobacco Use  Smoking Status Former Smoker  . Packs/day: 1.00  . Years: 40.00  . Pack years: 40.00  . Types: Cigarettes  . Start date: 76  . Quit date: 2012  . Years since quitting: 9.9  Smokeless Tobacco Never Used    Labs: Recent Review Flowsheet Data    Labs for ITP Cardiac and Pulmonary Rehab Latest Ref Rng & Units 04/23/2017 01/13/2019 02/10/2019 12/18/2019 03/26/2020   Cholestrol 100 - 199 mg/dL - 433 - 295 18(A)   LDLCALC 0 - 99 mg/dL - 50 - 48 43   HDL >41 mg/dL - 66(A) - 63(K) 16(W)   Trlycerides 0 - 149 mg/dL - 109 - 323(F) 82   Hemoglobin A1c 4.6 - 6.5 % 6.4 - 7.0(H) - -   TCO2 0 - 100 mmol/L - - - - -      Capillary Blood Glucose: No results found for: GLUCAP   Exercise Target Goals: Exercise Program Goal: Individual exercise prescription set using results from initial  6 min walk test and THRR while considering  patient's activity barriers and safety.   Exercise Prescription Goal: Starting with aerobic activity 30 plus minutes a day, 3 days per week for initial exercise prescription. Provide home exercise prescription and guidelines that participant acknowledges understanding prior to discharge.  Activity Barriers & Risk Stratification:  Activity Barriers & Cardiac Risk Stratification - 02/19/20 1120      Activity Barriers & Cardiac Risk Stratification   Activity Barriers Decreased Ventricular Function    Cardiac Risk Stratification High           6 Minute Walk:  6 Minute Walk    Row Name 02/19/20 1026  04/07/20 1130       6 Minute Walk   Phase Initial Discharge    Distance 1501 feet 1800 feet    Distance % Change -- 19.92 %    Distance Feet Change -- 299 ft    Walk Time 6 minutes 6 minutes    # of Rest Breaks 0 0    MPH 2.84 3.41    METS 3.32 3.55    RPE 9 11    Perceived Dyspnea  2 0    VO2 Peak 11.6 12.43    Symptoms Yes (comment) No    Comments SOB, RPD=2, Pt feels due to mask --    Resting HR 76 bpm 93 bpm    Resting BP 120/74 122/74    Resting Oxygen Saturation  95 % --    Exercise Oxygen Saturation  during 6 min walk 96 % --    Max Ex. HR 114 bpm 126 bpm    Max Ex. BP 154/80 102/76    2 Minute Post BP 130/74 100/74           Oxygen Initial Assessment:   Oxygen Re-Evaluation:   Oxygen Discharge (Final Oxygen Re-Evaluation):   Initial Exercise Prescription:  Initial Exercise Prescription - 02/19/20 1100      Date of Initial Exercise RX and Referring Provider   Date 02/19/20    Referring Provider Chilton Si, MD    Expected Discharge Date 04/14/20      NuStep   Level 2    SPM 75    Minutes 15    METs 1.8      Track   Laps 12    Minutes 15    METs 2.39      Prescription Details   Frequency (times per week) 3    Duration Progress to 30 minutes of continuous aerobic without signs/symptoms of physical distress      Intensity   THRR 40-80% of Max Heartrate 61-122    Ratings of Perceived Exertion 11-13    Perceived Dyspnea 0-4      Resistance Training   Training Prescription Yes    Weight 3 lbs           Perform Capillary Blood Glucose checks as needed.  Exercise Prescription Changes:  Exercise Prescription Changes    Row Name 02/25/20 1300 03/10/20 1200 03/17/20 1030 03/22/20 1107 04/05/20 1103     Response to Exercise   Blood Pressure (Admit) 120/66 138/82 130/70 114/76 130/72   Blood Pressure (Exercise) 128/74 122/80 118/78 148/80 150/80   Blood Pressure (Exit) 110/78 112/60 108/70 112/70 104/64   Heart Rate (Admit) 89 bpm 90  bpm 90 bpm 95 bpm 89 bpm   Heart Rate (Exercise) 105 bpm 118 bpm 122 bpm 120 bpm 133 bpm   Heart Rate (Exit) 89 bpm 100 bpm  92 bpm 100 bpm 89 bpm   Rating of Perceived Exertion (Exercise) 11 12 12 12 11    Perceived Dyspnea (Exercise) 0 0 0 0 0   Symptoms None None None None None   Comments Pt's first day of exercise Reviewed Home Exercise None -- --   Duration Progress to 30 minutes of  aerobic without signs/symptoms of physical distress Continue with 30 min of aerobic exercise without signs/symptoms of physical distress. Continue with 30 min of aerobic exercise without signs/symptoms of physical distress. Continue with 30 min of aerobic exercise without signs/symptoms of physical distress. Continue with 30 min of aerobic exercise without signs/symptoms of physical distress.   Intensity THRR unchanged THRR unchanged THRR unchanged THRR unchanged THRR unchanged     Progression   Progression Continue to progress workloads to maintain intensity without signs/symptoms of physical distress. Continue to progress workloads to maintain intensity without signs/symptoms of physical distress. Continue to progress workloads to maintain intensity without signs/symptoms of physical distress. Continue to progress workloads to maintain intensity without signs/symptoms of physical distress. Continue to progress workloads to maintain intensity without signs/symptoms of physical distress.   Average METs 1.9 3.01 3.7 3.8 3.9     Resistance Training   Training Prescription No No No Yes Yes   Weight -- -- -- 4 lbs 5 lbs   Reps -- -- -- 10-15 10-15   Time -- -- -- 10 Minutes 10 Minutes     Interval Training   Interval Training No -- -- No No     NuStep   Level 2 4 5 5 5    SPM 75 90 95 95 95   Minutes 15 15 15 15 15    METs 1.9 2.7 3.6 3.7 4     Track   Laps 14 20 24 25 24    Minutes 15 15 15 15 15    METs 2.62 3.32 3.78 3.9 3.78     Home Exercise Plan   Plans to continue exercise at -- Home (comment)   Walking Home (comment)  Walking Home (comment)  Walking Home (comment)  Walking   Frequency -- Add 3 additional days to program exercise sessions. Add 3 additional days to program exercise sessions. Add 3 additional days to program exercise sessions. Add 3 additional days to program exercise sessions.   Initial Home Exercises Provided -- 03/10/20 03/10/20 03/10/20 03/10/20          Exercise Comments:  Exercise Comments    Row Name 02/25/20 1321 03/10/20 1212 03/22/20 1118 04/07/20 1114     Exercise Comments Pt's first day of exercise. Pt responded well to exercise prescription and was oriented to exercise equipment. Will continue to monitor and progress as tolerated. Reviewed Home Exercise Plan with pt verbally. Pt cannot read, so the handout was not given to pt. Pt is walking 5-6 days a week for 30 minutes. Pt states he walks in the morning to start his day. Will continue to monitor pt's progress. Reviewed Mets and goals with patient. Reviewed METs and goals with patient.           Exercise Goals and Review:  Exercise Goals    Row Name 02/19/20 1122             Exercise Goals   Increase Physical Activity Yes       Intervention Provide advice, education, support and counseling about physical activity/exercise needs.;Develop an individualized exercise prescription for aerobic and resistive training based on initial evaluation findings, risk stratification, comorbidities and  participant's personal goals.       Expected Outcomes Short Term: Attend rehab on a regular basis to increase amount of physical activity.;Long Term: Add in home exercise to make exercise part of routine and to increase amount of physical activity.;Long Term: Exercising regularly at least 3-5 days a week.       Increase Strength and Stamina Yes       Intervention Provide advice, education, support and counseling about physical activity/exercise needs.;Develop an individualized exercise prescription for aerobic and  resistive training based on initial evaluation findings, risk stratification, comorbidities and participant's personal goals.       Expected Outcomes Short Term: Increase workloads from initial exercise prescription for resistance, speed, and METs.;Short Term: Perform resistance training exercises routinely during rehab and add in resistance training at home;Long Term: Improve cardiorespiratory fitness, muscular endurance and strength as measured by increased METs and functional capacity ( )       Able to understand and use rate of perceived exertion (RPE) scale Yes       Intervention Provide education and explanation on how to use RPE scale       Expected Outcomes Short Term: Able to use RPE daily in rehab to express subjective intensity level;Long Term:  Able to use RPE to guide intensity level when exercising independently       Knowledge and understanding of Target Heart Rate Range (THRR) Yes       Intervention Provide education and explanation of THRR including how the numbers were predicted and where they are located for reference       Expected Outcomes Short Term: Able to state/look up THRR;Long Term: Able to use THRR to govern intensity when exercising independently;Short Term: Able to use daily as guideline for intensity in rehab       Able to check pulse independently Yes       Intervention Provide education and demonstration on how to check pulse in carotid and radial arteries.;Review the importance of being able to check your own pulse for safety during independent exercise       Expected Outcomes Short Term: Able to explain why pulse checking is important during independent exercise;Long Term: Able to check pulse independently and accurately       Understanding of Exercise Prescription Yes       Intervention Provide education, explanation, and written materials on patient's individual exercise prescription       Expected Outcomes Short Term: Able to explain program exercise  prescription;Long Term: Able to explain home exercise prescription to exercise independently              Exercise Goals Re-Evaluation :  Exercise Goals Re-Evaluation    Row Name 02/25/20 1321 03/10/20 1216 03/22/20 1118 04/07/20 1114       Exercise Goal Re-Evaluation   Exercise Goals Review Increase Physical Activity;Able to understand and use rate of perceived exertion (RPE) scale;Knowledge and understanding of Target Heart Rate Range (THRR);Understanding of Exercise Prescription Increase Physical Activity;Able to understand and use rate of perceived exertion (RPE) scale;Knowledge and understanding of Target Heart Rate Range (THRR);Understanding of Exercise Prescription;Increase Strength and Stamina;Able to check pulse independently Increase Physical Activity;Able to understand and use rate of perceived exertion (RPE) scale;Knowledge and understanding of Target Heart Rate Range (THRR);Understanding of Exercise Prescription;Increase Strength and Stamina;Able to check pulse independently Increase Physical Activity;Able to understand and use rate of perceived exertion (RPE) scale;Knowledge and understanding of Target Heart Rate Range (THRR);Understanding of Exercise Prescription;Increase Strength and Stamina;Able to check  pulse independently    Comments Today was pt's first day of exercise. Pt able to exercise 30 minutes with no difficulty. Will follow up with pt regarding plans for home exercise. Reviewed HEP with pt. Also discussed THRR, RPE Scale, weather conditions, endpoints of exercise, NTG use, warmup and cool down. Patient is progressing well with exercise. Patient is walking 30 minutes 4 days/week in addition to exercise at cardiac rehab. Patient will complete the cardiac rehab program next week and has progressed well. Patient's functional capacity increased 20% as measured by 6-minute walk test. Patient feels that his stamina is a little better now than it was before. Patient currently walks  outdoors as his mode of home exercise. Patient is looking for indoor options for exercise either the walking track 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    Expected Outcomes Pt will continue to increase strength and stamina. Pt will continue to exercise most days of the week, by walking for 30 minutes. Patient will continue current exercise routine to help achieve personal health and fitness goals. Patient will continue walking or exercise at fitness center to maintain health and fitness gains.            Discharge Exercise Prescription (Final Exercise Prescription Changes):  Exercise Prescription Changes - 04/05/20 1103      Response to Exercise   Blood Pressure (Admit) 130/72    Blood Pressure (Exercise) 150/80    Blood Pressure (Exit) 104/64    Heart Rate (Admit) 89 bpm    Heart Rate (Exercise) 133 bpm    Heart Rate (Exit) 89 bpm    Rating of Perceived Exertion (Exercise) 11    Perceived Dyspnea (Exercise) 0    Symptoms None    Duration Continue with 30 min of aerobic exercise without signs/symptoms of physical distress.    Intensity THRR unchanged      Progression   Progression Continue to progress workloads to maintain intensity without signs/symptoms of physical distress.    Average METs 3.9      Resistance Training   Training Prescription Yes    Weight 5 lbs    Reps 10-15    Time 10 Minutes      Interval Training   Interval Training No      NuStep   Level 5    SPM 95    Minutes 15    METs 4      Track   Laps 24    Minutes 15    METs 3.78      Home Exercise Plan   Plans to continue exercise at Home (comment)   Walking   Frequency Add 3 additional days to program exercise sessions.    Initial Home Exercises Provided 03/10/20           Nutrition:  Target Goals: Understanding of nutrition guidelines, daily intake of sodium 1500mg , cholesterol 200mg , calories 30% from fat and 7% or less  from saturated fats, daily to have 5 or more servings of fruits and vegetables.  Biometrics:  Pre Biometrics - 02/19/20 0959      Pre Biometrics   Waist Circumference 49 inches    Hip Circumference 45 inches    Waist to Hip Ratio 1.09 %    Triceps Skinfold 21 mm    % Body Fat 36.3 %    Grip Strength 42 kg    Flexibility 13.5 in  Single Leg Stand 20.62 seconds           Post Biometrics - 04/07/20 1140       Post  Biometrics   Waist Circumference 43.25 inches    Hip Circumference 42.75 inches    Waist to Hip Ratio 1.01 %    Triceps Skinfold 20 mm    Grip Strength 39.5 kg    Flexibility 15.75 in    Single Leg Stand 16.56 seconds           Nutrition Therapy Plan and Nutrition Goals:  Nutrition Therapy & Goals - 03/16/20 1441      Nutrition Therapy   Diet Heart healthy    Drug/Food Interactions Statins/Certain Fruits      Personal Nutrition Goals   Nutrition Goal Pt to reduce sodium intake by avoiding salt shaker    Personal Goal #2 Pt to build a healthy plate including vegetables, fruits, whole grains, and low-fat dairy products in a heart healthy meal plan.    Personal Goal #3 Pt able to name foods that affect blood glucose      Intervention Plan   Intervention Prescribe, educate and counsel regarding individualized specific dietary modifications aiming towards targeted core components such as weight, hypertension, lipid management, diabetes, heart failure and other comorbidities.    Expected Outcomes Short Term Goal: Understand basic principles of dietary content, such as calories, fat, sodium, cholesterol and nutrients.           Nutrition Assessments:  Nutrition Assessments - 04/05/20 1432      MEDFICTS Scores   Pre Score 47          MEDIFICTS Score Key:  ?70 Need to make dietary changes   40-70 Heart Healthy Diet  ? 40 Therapeutic Level Cholesterol Diet   Picture Your Plate Scores:  <40 Unhealthy dietary pattern with much room for  improvement.  41-50 Dietary pattern unlikely to meet recommendations for good health and room for improvement.  51-60 More healthful dietary pattern, with some room for improvement.   >60 Healthy dietary pattern, although there may be some specific behaviors that could be improved.    Nutrition Goals Re-Evaluation:  Nutrition Goals Re-Evaluation    Row Name 03/16/20 1442 04/09/20 1140           Goals   Current Weight 204 lb 5.9 oz (92.7 kg) 200 lb 13.4 oz (91.1 kg)      Nutrition Goal Pt to reduce sodium intake by avoiding salt shaker Pt to reduce sodium intake by avoiding salt shaker        Personal Goal #2 Re-Evaluation   Personal Goal #2 Pt to build a healthy plate including vegetables, fruits, whole grains, and low-fat dairy products in a heart healthy meal plan. Pt to build a healthy plate including vegetables, fruits, whole grains, and low-fat dairy products in a heart healthy meal plan.        Personal Goal #3 Re-Evaluation   Personal Goal #3 Pt able to name foods that affect blood glucose Pt able to name foods that affect blood glucose             Nutrition Goals Discharge (Final Nutrition Goals Re-Evaluation):  Nutrition Goals Re-Evaluation - 04/09/20 1140      Goals   Current Weight 200 lb 13.4 oz (91.1 kg)    Nutrition Goal Pt to reduce sodium intake by avoiding salt shaker      Personal Goal #2 Re-Evaluation   Personal Goal #2 Pt to  build a healthy plate including vegetables, fruits, whole grains, and low-fat dairy products in a heart healthy meal plan.      Personal Goal #3 Re-Evaluation   Personal Goal #3 Pt able to name foods that affect blood glucose           Psychosocial: Target Goals: Acknowledge presence or absence of significant depression and/or stress, maximize coping skills, provide positive support system. Participant is able to verbalize types and ability to use techniques and skills needed for reducing stress and depression.  Initial Review  & Psychosocial Screening:  Initial Psych Review & Screening - 02/19/20 1319      Initial Review   Current issues with None Identified      Family Dynamics   Good Support System? Yes    Comments Mr. Weida presents to cardiac rehab orientation with a positive attitude and outlook. He is married with 2 children and 3 grandchildren who live locally. His support system is intact. States he "loves living life". No identifiable psychosocial barriers to participation in cardiac rehab. No interventions needed.      Barriers   Psychosocial barriers to participate in program There are no identifiable barriers or psychosocial needs.      Screening Interventions   Interventions Encouraged to exercise           Quality of Life Scores:  Quality of Life - 02/25/20 1331      Quality of Life   Select Quality of Life      Quality of Life Scores   Health/Function Pre 14 %    Socioeconomic Pre 24 %    Psych/Spiritual Pre 23.14 %    Family Pre 13.2 %    GLOBAL Pre 17.64 %          Scores of 19 and below usually indicate a poorer quality of life in these areas.  A difference of  2-3 points is a clinically meaningful difference.  A difference of 2-3 points in the total score of the Quality of Life Index has been associated with significant improvement in overall quality of life, self-image, physical symptoms, and general health in studies assessing change in quality of life.  PHQ-9: Recent Review Flowsheet Data    Depression screen [facility] 2/9 02/19/2020 02/10/2019 01/30/2018   Decreased Interest 0 0 0   Down, Depressed, Hopeless 0 0 0   PHQ - 2 Score 0 0 0     Interpretation of Total Score  Total Score Depression Severity:  1-4 = Minimal depression, 5-9 = Mild depression, 10-14 = Moderate depression, 15-19 = Moderately severe depression, 20-27 = Severe depression   Psychosocial Evaluation and Intervention:   Psychosocial Re-Evaluation:  Psychosocial Re-Evaluation    Row Name 03/17/20 1211  04/13/20 1134           Psychosocial Re-Evaluation   Current issues with None Identified None Identified      Comments -- Rally completes cardiac rehab tomorrow      Interventions Encouraged to attend Cardiac Rehabilitation for the exercise Encouraged to attend Cardiac Rehabilitation for the exercise      Continue Psychosocial Services  No Follow up required No Follow up required             Psychosocial Discharge (Final Psychosocial Re-Evaluation):  Psychosocial Re-Evaluation - 04/13/20 1134      Psychosocial Re-Evaluation   Current issues with None Identified    Comments Jaimes completes cardiac rehab tomorrow    Interventions Encouraged to attend Cardiac Rehabilitation for  the exercise    Continue Psychosocial Services  No Follow up required           Vocational Rehabilitation: Provide vocational rehab assistance to qualifying candidates.   Vocational Rehab Evaluation & Intervention:   Education: Education Goals: Education classes will be provided on a weekly basis, covering required topics. Participant will state understanding/return demonstration of topics presented.  Learning Barriers/Preferences:  Learning Barriers/Preferences - 02/19/20 1108      Learning Barriers/Preferences   Learning Barriers Reading;Sight   Wears glasses   Learning Preferences Individual Instruction;Skilled Demonstration           Education Topics: Hypertension, Hypertension Reduction -Define heart disease and high blood pressure. Discus how high blood pressure affects the body and ways to reduce high blood pressure.   Exercise and Your Heart -Discuss why it is important to exercise, the FITT principles of exercise, normal and abnormal responses to exercise, and how to exercise safely.   Angina -Discuss definition of angina, causes of angina, treatment of angina, and how to decrease risk of having angina.   Cardiac Medications -Review what the following cardiac medications  are used for, how they affect the body, and side effects that may occur when taking the medications.  Medications include Aspirin, Beta blockers, calcium channel blockers, ACE Inhibitors, angiotensin receptor blockers, diuretics, digoxin, and antihyperlipidemics.   Congestive Heart Failure -Discuss the definition of CHF, how to live with CHF, the signs and symptoms of CHF, and how keep track of weight and sodium intake.   Heart Disease and Intimacy -Discus the effect sexual activity has on the heart, how changes occur during intimacy as we age, and safety during sexual activity.   Smoking Cessation / COPD -Discuss different methods to quit smoking, the health benefits of quitting smoking, and the definition of COPD.   Nutrition I: Fats -Discuss the types of cholesterol, what cholesterol does to the heart, and how cholesterol levels can be controlled.   Nutrition II: Labels -Discuss the different components of food labels and how to read food label   Heart Parts/Heart Disease and PAD -Discuss the anatomy of the heart, the pathway of blood circulation through the heart, and these are affected by heart disease.   Stress I: Signs and Symptoms -Discuss the causes of stress, how stress may lead to anxiety and depression, and ways to limit stress.   Stress II: Relaxation -Discuss different types of relaxation techniques to limit stress.   Warning Signs of Stroke / TIA -Discuss definition of a stroke, what the signs and symptoms are of a stroke, and how to identify when someone is having stroke.   Knowledge Questionnaire Score:  Knowledge Questionnaire Score - 02/25/20 1331      Knowledge Questionnaire Score   Pre Score 17/24           Core Components/Risk Factors/Patient Goals at Admission:  Personal Goals and Risk Factors at Admission - 02/19/20 1110      Core Components/Risk Factors/Patient Goals on Admission    Weight Management Yes;Obesity;Weight Loss    Admit  Weight 208 lb 8.9 oz (94.6 kg)    Expected Outcomes Short Term: Continue to assess and modify interventions until short term weight is achieved;Long Term: Adherence to nutrition and physical activity/exercise program aimed toward attainment of established weight goal;Weight Maintenance: Understanding of the daily nutrition guidelines, which includes 25-35% calories from fat, 7% or less cal from saturated fats, less than 200mg  cholesterol, less than 1.5gm of sodium, & 5 or more servings  of fruits and vegetables daily;Weight Loss: Understanding of general recommendations for a balanced deficit meal plan, which promotes 1-2 lb weight loss per week and includes a negative energy balance of [phone] kcal/d;Understanding recommendations for meals to include 15-35% energy as protein, 25-35% energy from fat, 35-60% energy from carbohydrates, less than  of dietary cholesterol, 20-35 gm of total fiber daily;Understanding of distribution of calorie intake throughout the day with the consumption of 4-5 meals/snacks    Diabetes Yes    Intervention Provide education about signs/symptoms and action to take for hypo/hyperglycemia.;Provide education about proper nutrition, including hydration, and aerobic/resistive exercise prescription along with prescribed medications to achieve blood glucose in normal ranges: Fasting glucose 65-99 mg/dL    Expected Outcomes Short Term: Participant verbalizes understanding of the signs/symptoms and immediate care of hyper/hypoglycemia, proper foot care and importance of medication, aerobic/resistive exercise and nutrition plan for blood glucose control.;Long Term: Attainment of HbA1C < 7%.    Heart Failure Yes    Intervention Provide a combined exercise and nutrition program that is supplemented with education, support and counseling about heart failure. Directed toward relieving symptoms such as shortness of breath, decreased exercise tolerance, and extremity edema.    Expected  Outcomes Improve functional capacity of life;Short term: Attendance in program 2-3 days a week with increased exercise capacity. Reported lower sodium intake. Reported increased fruit and vegetable intake. Reports medication compliance.;Long term: Adoption of self-care skills and reduction of barriers for early signs and symptoms recognition and intervention leading to self-care maintenance.;Short term: Daily weights obtained and reported for increase. Utilizing diuretic protocols set by physician.    Hypertension Yes    Intervention Provide education on lifestyle modifcations including regular physical activity/exercise, weight management, moderate sodium restriction and increased consumption of fresh fruit, vegetables, and low fat dairy, alcohol moderation, and smoking cessation.;Monitor prescription use compliance.    Expected Outcomes Short Term: Continued assessment and intervention until BP is < 140/53mm HG in hypertensive participants. < 130/42mm HG in hypertensive participants with diabetes, heart failure or chronic kidney disease.;Long Term: Maintenance of blood pressure at goal levels.    Lipids Yes    Intervention Provide education and support for participant on nutrition & aerobic/resistive exercise along with prescribed medications to achieve LDL 70mg , HDL >40mg .    Expected Outcomes Short Term: Participant states understanding of desired cholesterol values and is compliant with medications prescribed. Participant is following exercise prescription and nutrition guidelines.;Long Term: Cholesterol controlled with medications as prescribed, with individualized exercise RX and with personalized nutrition plan. Value goals: LDL < , HDL > 40 mg.           Core Components/Risk Factors/Patient Goals Review:   Goals and Risk Factor Review    Row Name 03/17/20 1212 04/13/20 1135           Core Components/Risk Factors/Patient Goals Review   Personal Goals Review Weight  Management/Obesity;Heart Failure;Hypertension;Lipids;Diabetes Weight Management/Obesity;Heart Failure;Hypertension;Lipids;Diabetes      Review Aymen's vital signs have been stable. Rahmon's diabetes is diet controlled he does not check his CBG's at home. Roberth is doing well with exercise. Maurisio's vital signs have been stable. Seger's diabetes is diet controlled he does not check his CBG's at home. Muaaz is doing well with exercise.      Expected Outcomes Demitri will continue to participate in phase 2 cardiac rehab for exercise, nutrtion and lifestyle modifications. Izyan completes cardiac rehab on 04/13/20. Evann will continues to exercise follow  nutrtion lifestyle modificatons upon graduation  Core Components/Risk Factors/Patient Goals at Discharge (Final Review):   Goals and Risk Factor Review - 04/13/20 1135      Core Components/Risk Factors/Patient Goals Review   Personal Goals Review Weight Management/Obesity;Heart Failure;Hypertension;Lipids;Diabetes    Review Clavin's vital signs have been stable. Melvin's diabetes is diet controlled he does not check his CBG's at home. Jemal is doing well with exercise.    Expected Outcomes Mitsuo completes cardiac rehab on 04/13/20. Husein will continues to exercise follow  nutrtion lifestyle modificatons upon graduation           ITP Comments:  ITP Comments    Row Name 02/19/20 0957 03/17/20 1210 04/13/20 1134       ITP Comments Dr. Armanda Magic Medical Director Cardiac Rehab  30 Day ITP Review. Dalessandro has good attendance and participation in phase 2 cardiac rehab 30 Day ITP Review. Kahari has good attendance and participation in phase 2 cardiac rehab. Jaelan completes cardiac rehab on 04/14/20            Comments: See ITP comments. Conrad completes cardiac rehab on 04/14/20. Lenville has lost 2.4 Kg since starting the program.Khylon Davies Harlon Flor, RN,BSN 04/13/2020 11:40 AM

## 2020-04-14 ENCOUNTER — Other Ambulatory Visit: Payer: Self-pay

## 2020-04-14 ENCOUNTER — Encounter (HOSPITAL_COMMUNITY)
Admission: RE | Admit: 2020-04-14 | Discharge: 2020-04-14 | Disposition: A | Discharge status: Home / Self Care | Payer: Medicare Other | Source: Ambulatory Visit | Attending: Cardiovascular Disease | Admitting: Cardiovascular Disease

## 2020-04-14 VITALS — BP 118/76 | HR 81 | Ht 65.0 in | Wt 201.3 lb

## 2020-04-14 DIAGNOSIS — I5022 Chronic systolic (congestive) heart failure: Secondary | ICD-10-CM

## 2020-04-21 ENCOUNTER — Other Ambulatory Visit: Payer: Self-pay

## 2020-04-21 ENCOUNTER — Ambulatory Visit: Payer: Medicare Other | Admitting: Internal Medicine

## 2020-04-21 ENCOUNTER — Encounter: Payer: Self-pay | Admitting: Internal Medicine

## 2020-04-21 VITALS — BP 122/72 | HR 72 | Ht 66.0 in | Wt 197.0 lb

## 2020-04-21 DIAGNOSIS — I5022 Chronic systolic (congestive) heart failure: Secondary | ICD-10-CM

## 2020-04-21 DIAGNOSIS — Z95810 Presence of automatic (implantable) cardiac defibrillator: Secondary | ICD-10-CM | POA: Diagnosis not present

## 2020-04-21 DIAGNOSIS — I2510 Atherosclerotic heart disease of native coronary artery without angina pectoris: Secondary | ICD-10-CM | POA: Diagnosis not present

## 2020-04-21 DIAGNOSIS — I5043 Acute on chronic combined systolic (congestive) and diastolic (congestive) heart failure: Secondary | ICD-10-CM

## 2020-04-21 LAB — CUP PACEART INCLINIC DEVICE CHECK
Battery Remaining Longevity: 25 mo
Brady Statistic RV Percent Paced: 0 %
Date Time Interrogation Session: 20211201165712
HighPow Impedance: 96.75 Ohm
Implantable Lead Implant Date: 20120621
Implantable Lead Location: 753860
Implantable Pulse Generator Implant Date: 20120621
Lead Channel Impedance Value: 975 Ohm
Lead Channel Pacing Threshold Amplitude: 1.25 V
Lead Channel Pacing Threshold Amplitude: 1.25 V
Lead Channel Pacing Threshold Pulse Width: 0.5 ms
Lead Channel Pacing Threshold Pulse Width: 0.5 ms
Lead Channel Sensing Intrinsic Amplitude: 12 mV
Lead Channel Setting Pacing Amplitude: 2.5 V
Lead Channel Setting Pacing Pulse Width: 0.5 ms
Lead Channel Setting Sensing Sensitivity: 0.5 mV
Pulse Gen Serial Number: 819170

## 2020-04-21 NOTE — Progress Notes (Signed)
HPI [name] returns today for followup. He is a pleasant 69 yo man with a h/o chronic systolic heart failure, EF 30%, HTN and obesity s/p ICD insertion. He has undergone cardiac rehab and lost weight and feels better.  No Known Allergies   Current Outpatient Medications  Medication Sig Dispense Refill  . aspirin 81 MG tablet Take 81 mg by mouth daily.      [name] atorvastatin (LIPITOR) 40 MG tablet Take 1 tablet (40 mg total) by mouth daily. Please keep upcoming appt in December with Dr. Ladona Ridgel for future refills. Thank you 90 tablet 0  . carvedilol (COREG) 3.125 MG tablet Take 1 tablet by mouth twice daily 180 tablet 3  . fluticasone (FLONASE) 50 MCG/ACT nasal spray Place 2 sprays into both nostrils daily. 16 g 6  . Multiple Vitamin (MULTIVITAMIN) tablet Take 1 tablet by mouth daily.    . nitroGLYCERIN (NITROSTAT) 0.4 MG SL tablet Place 1 tablet (0.4 mg total) under the tongue every 5 (five) minutes as needed for chest pain (for chest pain). 25 tablet 6  . ondansetron (ZOFRAN ODT) 4 MG disintegrating tablet Take 1 tablet (4 mg total) by mouth every 8 (eight) hours as needed for nausea or vomiting. 3 tablet 0  . sacubitril-valsartan (ENTRESTO) 24-26 MG Take 1 tablet by mouth 2 (two) times daily. 180 tablet 3  . spironolactone (ALDACTONE) 25 MG tablet TAKE 1/2 (ONE-HALF) TABLET BY MOUTH ONCE DAILY . (KEEP OFFICE VISIT) 45 tablet 2   No current facility-administered medications for this visit.     Past Medical History:  Diagnosis Date  . Acute on chronic combined systolic and diastolic CHF (congestive heart failure) (HCC) 01/27/2020  . Coronary artery disease   . Dyslipidemia   . Erectile dysfunction 08/18/2016  . Fatigue 12/18/2019  . History of acute anterior wall MI   . Hyperlipidemia   . ICD (implantable cardiac defibrillator) in place June 2012  . Ischemic cardiomyopathy   . Personal history of MRSA (methicillin resistant Staphylococcus aureus)   . Tobacco abuse   . Ventricular  dysfunction 07/07/2010   Severe left ventricular dysfunction with ejection fraction of 30-35%    ROS:   All systems reviewed and negative except as noted in the HPI.   Past Surgical History:  Procedure Laterality Date  . CARDIAC CATHETERIZATION  07/07/2010   DES to proximal LAD  . CARDIAC CATHETERIZATION  2004  . EP IMPLANTABLE DEVICE  June 2012     Family History  Problem Relation Age of Onset  . Hypertension Mother   . Bone cancer Mother   . Hyperlipidemia Father   . Bladder Cancer Father   . Diabetes Father      Social History   Socioeconomic History  . Marital status: Married    Spouse name: Not on file  . Number of children: Not on file  . Years of education: Not on file  . Highest education level: Not on file  Occupational History  . Not on file  Tobacco Use  . Smoking status: Former Smoker    Packs/day: 1.00    Years: 40.00    Pack years: 40.00    Types: Cigarettes    Start date: 49    Quit date: 2012    Years since quitting: 9.9  . Smokeless tobacco: Never Used  Vaping Use  . Vaping Use: Never used  Substance and Sexual Activity  . Alcohol use: No  . Drug use: No  . Sexual  activity: Yes  Other Topics Concern  . Not on file  Social History Narrative  . Not on file   Social Determinants of Health   Financial Resource Strain:   . Difficulty of Paying Living Expenses: Not on file  Food Insecurity:   . Worried About Programme researcher, broadcasting/film/video in the Last Year: Not on file  . Ran Out of Food in the Last Year: Not on file  Transportation Needs:   . Lack of Transportation (Medical): Not on file  . Lack of Transportation (Non-Medical): Not on file  Physical Activity:   . Days of Exercise per Week: Not on file  . Minutes of Exercise per Session: Not on file  Stress:   . Feeling of Stress : Not on file  Social Connections:   . Frequency of Communication with Friends and Family: Not on file  . Frequency of Social Gatherings with Friends and Family:  Not on file  . Attends Religious Services: Not on file  . Active Member of Clubs or Organizations: Not on file  . Attends Banker Meetings: Not on file  . Marital Status: Not on file  Intimate Partner Violence:   . Fear of Current or Ex-Partner: Not on file  . Emotionally Abused: Not on file  . Physically Abused: Not on file  . Sexually Abused: Not on file     BP 122/72   Pulse 72   Ht 5\' 6"  (1.676 m)   Wt 197 lb (89.4 kg)   SpO2 97%   BMI 31.80 kg/m   Physical Exam:  Well appearing NAD HEENT: Unremarkable Neck:  No JVD, no thyromegally Lymphatics:  No adenopathy Back:  No CVA tenderness Lungs:  Clear with no wheezes HEART:  Regular rate rhythm, no murmurs, no rubs, no clicks Abd:  soft, positive bowel sounds, no organomegally, no rebound, no guarding Ext:  2 plus pulses, no edema, no cyanosis, no clubbing Skin:  No rashes no nodules Neuro:  CN II through XII intact, motor grossly intact  EKG - nsr  DEVICE  Normal device function.  See PaceArt for details.   Assess/Plan: 1. Chronic systolic heart failure - his symptoms are class 2. He will continue his current meds. 2. ICD - his [employer] ICD is working normally. We will recheck in several months 3. Obesity - he has lost 10 lbs. He is encouraged to lose more weight. 4. CAD -  He is s/p MI, he denies anginal symptoms.   Kortny Lirette,MD

## 2020-04-21 NOTE — Patient Instructions (Signed)

## 2020-04-22 ENCOUNTER — Ambulatory Visit: Payer: Medicare Other | Admitting: Cardiovascular Disease

## 2020-04-26 ENCOUNTER — Other Ambulatory Visit: Payer: Self-pay | Admitting: Internal Medicine

## 2020-05-04 NOTE — Progress Notes (Signed)
Discharge Progress Report  Patient Details  Name: FARZAD TIBBETTS MRN: [id number] Date of Birth: 1950/07/11 Referring Provider:   Flowsheet Row CARDIAC REHAB PHASE II ORIENTATION from 02/19/2020 in [facility]  Referring Provider Skeet Latch, MD       Number of Visits: 18  Reason for Discharge:  Patient reached a stable level of exercise. Patient independent in their exercise. Patient has met program and personal goals.  Smoking History:  Social History   Tobacco Use  Smoking Status Former Smoker   Packs/day: 1.00   Years: 40.00   Pack years: 40.00   Types: Cigarettes   Start date: 2   Quit date: 2012   Years since quitting: 9.9  Smokeless Tobacco Never Used    Diagnosis:  Chronic systolic heart failure ([place])  ADL UCSD:   Initial Exercise Prescription:  Initial Exercise Prescription - 02/19/20 1100      Date of Initial Exercise RX and Referring Provider   Date 02/19/20    Referring Provider Skeet Latch, MD    Expected Discharge Date 04/14/20      NuStep   Level 2    SPM 75    Minutes 15    METs 1.8      Track   Laps 12    Minutes 15    METs 2.39      Prescription Details   Frequency (times per week) 3    Duration Progress to 30 minutes of continuous aerobic without signs/symptoms of physical distress      Intensity   THRR 40-80% of Max Heartrate 61-122    Ratings of Perceived Exertion 11-13    Perceived Dyspnea 0-4      Resistance Training   Training Prescription Yes    Weight 3 lbs           Discharge Exercise Prescription (Final Exercise Prescription Changes):  Exercise Prescription Changes - 04/14/20 1111      Response to Exercise   Blood Pressure (Admit) 118/76    Blood Pressure (Exercise) 144/70    Blood Pressure (Exit) 104/68    Heart Rate (Admit) 81 bpm    Heart Rate (Exercise) 121 bpm    Heart Rate (Exit) 72 bpm    Rating of Perceived Exertion (Exercise) 10    Perceived Dyspnea  (Exercise) 0    Symptoms None    Duration Continue with 30 min of aerobic exercise without signs/symptoms of physical distress.    Intensity THRR unchanged      Progression   Progression Continue to progress workloads to maintain intensity without signs/symptoms of physical distress.    Average METs 3.5      Resistance Training   Training Prescription No      Interval Training   Interval Training No      NuStep   Level 5    SPM 95    Minutes 15    METs 3.6      Track   Laps 21    Minutes 15    METs 3.44      Home Exercise Plan   Plans to continue exercise at Home (comment)   Walking   Frequency Add 3 additional days to program exercise sessions.    Initial Home Exercises Provided 03/10/20           Functional Capacity:  6 Minute Walk    Row Name 02/19/20 1026 04/07/20 1130       6 Minute Walk   Phase  Initial Discharge    Distance 1501 feet 1800 feet    Distance % Change -- 19.92 %    Distance Feet Change -- 299 ft    Walk Time 6 minutes 6 minutes    # of Rest Breaks 0 0    MPH 2.84 3.41    METS 3.32 3.55    RPE 9 11    Perceived Dyspnea  2 0    VO2 Peak 11.6 12.43    Symptoms Yes (comment) No    Comments SOB, RPD=2, Pt feels due to mask --    Resting HR 76 bpm 93 bpm    Resting BP 120/74 122/74    Resting Oxygen Saturation  95 % --    Exercise Oxygen Saturation  during 6 min walk 96 % --    Max Ex. HR 114 bpm 126 bpm    Max Ex. BP 154/80 102/76    2 Minute Post BP 130/74 100/74           Psychological, QOL, Others - Outcomes: PHQ 2/9: Depression screen [facility] 2/9 05/04/2020 02/19/2020 02/10/2019 01/30/2018  Decreased Interest 0 0 0 0  Down, Depressed, Hopeless 0 0 0 0  PHQ - 2 Score 0 0 0 0  Some recent data might be hidden    Quality of Life:  Quality of Life - 04/26/20 1446      Quality of Life   Select Quality of Life      Quality of Life Scores   Health/Function Pre 14 %    Health/Function Post 22.57 %    Health/Function % Change 61.21  %    Socioeconomic Pre 24 %    Socioeconomic Post 26 %    Socioeconomic % Change  8.33 %    Psych/Spiritual Pre 23.14 %    Psych/Spiritual Post 29.14 %    Psych/Spiritual % Change 25.93 %    Family Pre 13.2 %    Family Post 24 %    Family % Change 81.82 %    GLOBAL Pre 17.64 %    GLOBAL Post 24.8 %    GLOBAL % Change 40.59 %           Personal Goals: Goals established at orientation with interventions provided to work toward goal.  Personal Goals and Risk Factors at Admission - 02/19/20 1110      Core Components/Risk Factors/Patient Goals on Admission    Weight Management Yes;Obesity;Weight Loss    Admit Weight 208 lb 8.9 oz (94.6 kg)    Expected Outcomes Short Term: Continue to assess and modify interventions until short term weight is achieved;Long Term: Adherence to nutrition and physical activity/exercise program aimed toward attainment of established weight goal;Weight Maintenance: Understanding of the daily nutrition guidelines, which includes 25-35% calories from fat, 7% or less cal from saturated fats, less than 247m cholesterol, less than 1.5gm of sodium, & 5 or more servings of fruits and vegetables daily;Weight Loss: Understanding of general recommendations for a balanced deficit meal plan, which promotes 1-2 lb weight loss per week and includes a negative energy balance of [phone] kcal/d;Understanding recommendations for meals to include 15-35% energy as protein, 25-35% energy from fat, 35-60% energy from carbohydrates, less than 2034mof dietary cholesterol, 20-35 gm of total fiber daily;Understanding of distribution of calorie intake throughout the day with the consumption of 4-5 meals/snacks    Diabetes Yes    Intervention Provide education about signs/symptoms and action to take for hypo/hyperglycemia.;Provide education about proper nutrition, including hydration,  and aerobic/resistive exercise prescription along with prescribed medications to achieve blood glucose in  normal ranges: Fasting glucose 65-99 mg/dL    Expected Outcomes Short Term: Participant verbalizes understanding of the signs/symptoms and immediate care of hyper/hypoglycemia, proper foot care and importance of medication, aerobic/resistive exercise and nutrition plan for blood glucose control.;Long Term: Attainment of HbA1C < 7%.    Heart Failure Yes    Intervention Provide a combined exercise and nutrition program that is supplemented with education, support and counseling about heart failure. Directed toward relieving symptoms such as shortness of breath, decreased exercise tolerance, and extremity edema.    Expected Outcomes Improve functional capacity of life;Short term: Attendance in program 2-3 days a week with increased exercise capacity. Reported lower sodium intake. Reported increased fruit and vegetable intake. Reports medication compliance.;Long term: Adoption of self-care skills and reduction of barriers for early signs and symptoms recognition and intervention leading to self-care maintenance.;Short term: Daily weights obtained and reported for increase. Utilizing diuretic protocols set by physician.    Hypertension Yes    Intervention Provide education on lifestyle modifcations including regular physical activity/exercise, weight management, moderate sodium restriction and increased consumption of fresh fruit, vegetables, and low fat dairy, alcohol moderation, and smoking cessation.;Monitor prescription use compliance.    Expected Outcomes Short Term: Continued assessment and intervention until BP is < 140/28m HG in hypertensive participants. < 130/831mHG in hypertensive participants with diabetes, heart failure or chronic kidney disease.;Long Term: Maintenance of blood pressure at goal levels.    Lipids Yes    Intervention Provide education and support for participant on nutrition & aerobic/resistive exercise along with prescribed medications to achieve LDL <7014mHDL >63m30m  Expected  Outcomes Short Term: Participant states understanding of desired cholesterol values and is compliant with medications prescribed. Participant is following exercise prescription and nutrition guidelines.;Long Term: Cholesterol controlled with medications as prescribed, with individualized exercise RX and with personalized nutrition plan. Value goals: LDL < 70mg12mL > 40 mg.            Personal Goals Discharge:  Goals and Risk Factor Review    Row Name 03/17/20 1212 04/13/20 1135 05/04/20 0937         Core Components/Risk Factors/Patient Goals Review   Personal Goals Review Weight Management/Obesity;Heart Failure;Hypertension;Lipids;Diabetes Weight Management/Obesity;Heart Failure;Hypertension;Lipids;Diabetes Weight Management/Obesity;Heart Failure;Hypertension;Lipids;Diabetes     Review Leverne's vital signs have been stable. Narek's diabetes is diet controlled he does not check his CBG's at home. ClaudHosieoing well with exercise. Lydon's vital signs have been stable. Lennin's diabetes is diet controlled he does not check his CBG's at home. ClaudZadeoing well with exercise. Kazmir's vital signs were stable. Eddi's diabetes is diet controlled he does not check his CBG's at home. Kashon did well with exercise. Makenzie completed cardiac rehab on 04/14/20     Expected Outcomes ClaudEhren continue to participate in phase 2 cardiac rehab for exercise, nutrtion and lifestyle modifications. ClaudOaklanletes cardiac rehab on 04/13/20. ClaudClaude continues to exercise follow  nutrtion lifestyle modificatons upon graduation ClaudMiriamleted cardiac rehab on 04/14/20. ClaudRyun continues to exercise follow  nutrtion lifestyle modificatons upon graduation            Exercise Goals and Review:  Exercise Goals    Row Name 02/19/20 1122             Exercise Goals   Increase Physical Activity Yes       Intervention Provide advice, education, support and counseling about  physical  activity/exercise needs.;Develop an individualized exercise prescription for aerobic and resistive training based on initial evaluation findings, risk stratification, comorbidities and participant's personal goals.       Expected Outcomes Short Term: Attend rehab on a regular basis to increase amount of physical activity.;Long Term: Add in home exercise to make exercise part of routine and to increase amount of physical activity.;Long Term: Exercising regularly at least 3-5 days a week.       Increase Strength and Stamina Yes       Intervention Provide advice, education, support and counseling about physical activity/exercise needs.;Develop an individualized exercise prescription for aerobic and resistive training based on initial evaluation findings, risk stratification, comorbidities and participant's personal goals.       Expected Outcomes Short Term: Increase workloads from initial exercise prescription for resistance, speed, and METs.;Short Term: Perform resistance training exercises routinely during rehab and add in resistance training at home;Long Term: Improve cardiorespiratory fitness, muscular endurance and strength as measured by increased METs and functional capacity (6MWT)       Able to understand and use rate of perceived exertion (RPE) scale Yes       Intervention Provide education and explanation on how to use RPE scale       Expected Outcomes Short Term: Able to use RPE daily in rehab to express subjective intensity level;Long Term:  Able to use RPE to guide intensity level when exercising independently       Knowledge and understanding of Target Heart Rate Range (THRR) Yes       Intervention Provide education and explanation of THRR including how the numbers were predicted and where they are located for reference       Expected Outcomes Short Term: Able to state/look up THRR;Long Term: Able to use THRR to govern intensity when exercising independently;Short Term: Able to use daily as  guideline for intensity in rehab       Able to check pulse independently Yes       Intervention Provide education and demonstration on how to check pulse in carotid and radial arteries.;Review the importance of being able to check your own pulse for safety during independent exercise       Expected Outcomes Short Term: Able to explain why pulse checking is important during independent exercise;Long Term: Able to check pulse independently and accurately       Understanding of Exercise Prescription Yes       Intervention Provide education, explanation, and written materials on patient's individual exercise prescription       Expected Outcomes Short Term: Able to explain program exercise prescription;Long Term: Able to explain home exercise prescription to exercise independently              Exercise Goals Re-Evaluation:  Exercise Goals Re-Evaluation    Row Name 02/25/20 1321 03/10/20 1216 03/22/20 1118 04/07/20 1114 04/26/20 1449     Exercise Goal Re-Evaluation   Exercise Goals Review Increase Physical Activity;Able to understand and use rate of perceived exertion (RPE) scale;Knowledge and understanding of Target Heart Rate Range (THRR);Understanding of Exercise Prescription Increase Physical Activity;Able to understand and use rate of perceived exertion (RPE) scale;Knowledge and understanding of Target Heart Rate Range (THRR);Understanding of Exercise Prescription;Increase Strength and Stamina;Able to check pulse independently Increase Physical Activity;Able to understand and use rate of perceived exertion (RPE) scale;Knowledge and understanding of Target Heart Rate Range (THRR);Understanding of Exercise Prescription;Increase Strength and Stamina;Able to check pulse independently Increase Physical Activity;Able to understand and use rate of  perceived exertion (RPE) scale;Knowledge and understanding of Target Heart Rate Range (THRR);Understanding of Exercise Prescription;Increase Strength and  Stamina;Able to check pulse independently Increase Physical Activity;Able to understand and use rate of perceived exertion (RPE) scale;Knowledge and understanding of Target Heart Rate Range (THRR);Understanding of Exercise Prescription;Increase Strength and Stamina;Able to check pulse independently   Comments Today was pt's first day of exercise. Pt able to exercise 30 minutes with no difficulty. Will follow up with pt regarding plans for home exercise. Reviewed HEP with pt. Also discussed THRR, RPE Scale, weather conditions, endpoints of exercise, NTG use, warmup and cool down. Patient is progressing well with exercise. Patient is walking 30 minutes 4 days/week in addition to exercise at cardiac rehab. Patient will complete the cardiac rehab program next week and has progressed well. Patient's functional capacity increased 20% as measured by 6-minute walk test. Patient feels that his stamina is a little better now than it was before. Patient currently walks outdoors as his mode of home exercise. 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 Patient completed the phase 2 cardiac rehab program and progressed well, achieving 3.9 METs with exercise. Patient will continue walking daily as his mode of exericse.   Expected Outcomes Pt will continue to increase strength and stamina. Pt will continue to exercise most days of the week, by walking for 30 minutes. Patient will continue current exercise routine to help achieve personal health and fitness goals. Patient will continue walking or exercise at fitness center to maintain health and fitness gains. Patient will walk at least 30 minutes 5-7 days/week to maintain health and fitness gains.          Nutrition & Weight - Outcomes:  Pre Biometrics - 02/19/20 0959      Pre Biometrics   Waist Circumference 49 inches    Hip  Circumference 45 inches    Waist to Hip Ratio 1.09 %    Triceps Skinfold 21 mm    % Body Fat 36.3 %    Grip Strength 42 kg    Flexibility 13.5 in    Single Leg Stand 20.62 seconds           Post Biometrics - 04/14/20 1111       Post  Biometrics   Height '5\' 5"'  (1.651 m)    Weight 91.3 kg    Waist Circumference 43.25 inches    Hip Circumference 42.75 inches    Waist to Hip Ratio 1.01 %    BMI (Calculated) 33.49    Triceps Skinfold 20 mm    % Body Fat 32.8 %    Grip Strength 39.5 kg    Flexibility 15.75 in    Single Leg Stand 16.56 seconds           Nutrition:  Nutrition Therapy & Goals - 03/16/20 1441      Nutrition Therapy   Diet Heart healthy    Drug/Food Interactions Statins/Certain Fruits      Personal Nutrition Goals   Nutrition Goal Pt to reduce sodium intake by avoiding salt shaker    Personal Goal #2 Pt to build a healthy plate including vegetables, fruits, whole grains, and low-fat dairy products in a heart healthy meal plan.    Personal Goal #3 Pt able to name foods that affect blood glucose      Intervention Plan  Intervention Prescribe, educate and counsel regarding individualized specific dietary modifications aiming towards targeted core components such as weight, hypertension, lipid management, diabetes, heart failure and other comorbidities.    Expected Outcomes Short Term Goal: Understand basic principles of dietary content, such as calories, fat, sodium, cholesterol and nutrients.           Nutrition Discharge:  Nutrition Assessments - 04/29/20 0937      MEDFICTS Scores   Post Score 31           Education Questionnaire Score:  Knowledge Questionnaire Score - 04/26/20 1444      Knowledge Questionnaire Score   Pre Score 17/24    Post Score 21/24           Goals reviewed with patient; copy given to patient.Pt graduated from cardiac rehab program today with completion of 18 exercise sessions in Phase II. Pt maintained good attendance  and progressed nicely during his participation in rehab as evidenced by increased MET level.   Medication list reconciled. Repeat  PHQ score 0  .  Pt has made significant lifestyle changes and should be commended for his success. Pt feels he has achieved his goals during cardiac rehab.   Pt plans to continue exercise by walking. Adalid increased his distance on his post exercise walk by 299 feet.We are proud of Kabeer's progress!Joeseph lost 2.1 kg while in the program. Barnet Pall, RN,BSN 05/04/2020 9:40 AM

## 2020-05-04 NOTE — Addendum Note (Signed)
Encounter addended by: Cammy Copa, RN on: 05/04/2020 9:29 AM  Actions taken: Pend clinical note

## 2020-05-04 NOTE — Addendum Note (Signed)
Encounter addended by: Cammy Copa, RN on: 05/04/2020 9:43 AM  Actions taken: Flowsheet data copied forward, Flowsheet accepted, Clinical Note Signed

## 2020-06-11 ENCOUNTER — Ambulatory Visit (INDEPENDENT_AMBULATORY_CARE_PROVIDER_SITE_OTHER): Payer: Medicare Other

## 2020-06-11 DIAGNOSIS — I255 Ischemic cardiomyopathy: Secondary | ICD-10-CM

## 2020-06-11 DIAGNOSIS — I5022 Chronic systolic (congestive) heart failure: Secondary | ICD-10-CM

## 2020-06-11 LAB — CUP PACEART REMOTE DEVICE CHECK
Battery Remaining Longevity: 26 mo
Battery Remaining Percentage: 22 %
Battery Voltage: 2.78 V
Brady Statistic RV Percent Paced: 1 %
Date Time Interrogation Session: 20220121035650
HighPow Impedance: 83 Ohm
HighPow Impedance: 83 Ohm
Implantable Lead Implant Date: 20120621
Implantable Lead Location: 753860
Implantable Pulse Generator Implant Date: 20120621
Lead Channel Impedance Value: 940 Ohm
Lead Channel Pacing Threshold Amplitude: 1.25 V
Lead Channel Pacing Threshold Pulse Width: 0.5 ms
Lead Channel Sensing Intrinsic Amplitude: 12 mV
Lead Channel Setting Pacing Amplitude: 2.5 V
Lead Channel Setting Pacing Pulse Width: 0.5 ms
Lead Channel Setting Sensing Sensitivity: 0.5 mV
Pulse Gen Serial Number: 819170

## 2020-06-14 ENCOUNTER — Other Ambulatory Visit: Payer: Self-pay

## 2020-06-14 ENCOUNTER — Ambulatory Visit (HOSPITAL_COMMUNITY): Payer: Medicare Other | Attending: Cardiology

## 2020-06-14 DIAGNOSIS — I255 Ischemic cardiomyopathy: Secondary | ICD-10-CM | POA: Diagnosis not present

## 2020-06-14 LAB — ECHOCARDIOGRAM COMPLETE
Area-P 1/2: 2.22 cm2
S' Lateral: 3.8 cm

## 2020-06-14 MED ORDER — PERFLUTREN LIPID MICROSPHERE
1.0000 mL | INTRAVENOUS | Status: AC | PRN
  Administered 2020-06-14: 1 mL via INTRAVENOUS

## 2020-06-22 NOTE — Progress Notes (Signed)
Cardiology Clinic Note   Patient Name: Spencer Horn Date of Encounter: 06/24/2020  Primary Care Provider:  Kaleen Mask, MD Primary Cardiologist:  Chilton Si, MD  Patient Profile    Spencer Horn 70 year old male presents today for follow-up of his coronary artery disease, ventricular dysfunction, hypertension, and chronic systolic heart failure.  Past Medical History    Past Medical History:  Diagnosis Date  . Acute on chronic combined systolic and diastolic CHF (congestive heart failure) (HCC) 01/27/2020  . Coronary artery disease   . Dyslipidemia   . Erectile dysfunction 08/18/2016  . Fatigue 12/18/2019  . History of acute anterior wall MI   . Hyperlipidemia   . ICD (implantable cardiac defibrillator) in place June 2012  . Ischemic cardiomyopathy   . Personal history of MRSA (methicillin resistant Staphylococcus aureus)   . Tobacco abuse   . Ventricular dysfunction 07/07/2010   Severe left ventricular dysfunction with ejection fraction of 30-35%   Past Surgical History:  Procedure Laterality Date  . CARDIAC CATHETERIZATION  07/07/2010   DES to proximal LAD  . CARDIAC CATHETERIZATION  2004  . EP IMPLANTABLE DEVICE  June 2012    Allergies  No Known Allergies  History of Present Illness    Spencer Horn has a past medical history of coronary artery disease status post MI, HTN, chronic systolic and diastolic CHF, ED, COPD, and hyperlipidemia.  He had myocardial infarction 11/2002 and February 2012.  With his second MI he was treated with DES x1 to the LAD.  His LVEF was 35% and subsequently underwent implantation of an ICD.  He had continued complaints of fatigue and his CBC, thyroid, vitamin D and testosterone levels were evaluated which were all normal.  He also has a history of exertional dyspnea.  His wife indicated that he snores mildly however, he is reluctant to have a sleep study.  He states that he would not wear a CPAP device and therefore is not willing to  undergo testing.  He was referred for PFTs that showed severe COPD and emphysema.  His Entresto was switched back to losartan due to cost and due to not having symptom improvement.  He was referred to Dr. [name] who started Spiriva.  He indicated that the Spiriva was not helpful so he stopped using it.  He does indicate that he has uses rescue albuterol inhaler as needed.  He only needs his rescue inhaler a couple times a year.     He was last seen by Dr. Duke Salvia on 11/16/2017.  During that time he was doing well denied chest pain, increased shortness of breath, lower extremity edema, orthopnea, or PND as well as palpitations, lightheadedness and dizziness.  He presented to the clinic 03/26/19 and stated he was doing well.  He continued to be active around his house doing yard work and inside housework.  His wife stated that she was unable to do housework due to her limited mobility therefore he managed their house.  He stated at times he was tired through the day especially after a busy day of physical activity on the previous day.  He was asked again about his sleep and sleep apnea.  His wife indicated that at times she did hear him stop breathing.  A sleep study was offered for consideration.  His wife also stated that at his PCP he was told he had prediabetes.  I will gave diabetic diet information to them today.  He was seen in follow-up by Dr.  [place] on 03/26/2020.  He reported increased fatigue and exertional dyspnea.  He underwent repeat echocardiogram 8/21 which showed an LVEF of 15-20% with global hypokinesis and apical akinesis, moderately dilated LV, and G1 DD.  No significant valvular abnormalities were noted.  He had a nuclear stress test which showed an LVEF of 30% with prior apical infarct and no ischemia.  During his 10/21 appointment his lisinopril was discontinued and he was started on Entresto.  He was also referred to cardiac rehab.  On follow-up he reported that he had been  participating in cardiac rehab and also walking 30 minutes/day on his nonrehab days.  He reported his breathing had improved, he denied chest pain, lower extremity swelling, orthopnea, PND.  He had lost about 10 pounds and was limiting beef in his diet.  He was seen in follow-up by Dr. Ladona Ridgelaylor 04/21/2020.  He continues to do well at that time.  His Saint Jude ICD was interrogated and working normally.  He presents to the clinic today for follow-up evaluation states he feels well.  He continues to walk greater than 9000 steps per day, do all the housework, and yard work as well.  He has also continued to lose weight and is 191 pounds today.  He reports that his weight at home is around 189 pounds.  He has been monitoring his blood pressure at home and been very strict with his diet.  We reviewed his echocardiogram today.  I will continue his current medications, give him salty 6 diet sheet, have him maintain his physical activity, and plan follow-up for 3 months.  We will order a BMP today to check renal function.  He denies chest pain, increased shortness of breath, lower extremity edema,  palpitations, melena, hematuria, hemoptysis, diaphoresis, weakness, presyncope, syncope, orthopnea, and PND.  Home Medications    Prior to Admission medications   Medication Sig Start Date End Date Taking? Authorizing Provider  aspirin 81 MG tablet Take 81 mg by mouth daily.      [provider]  atorvastatin (LIPITOR) 40 MG tablet Take 1 tablet by mouth once daily 04/26/20   Marinus Mawaylor, Gregg W, MD  carvedilol (COREG) 3.125 MG tablet Take 1 tablet by mouth twice daily 01/13/20   Chilton Siandolph, Tiffany, MD  fluticasone [facility](FLONASE) 50 MCG/ACT nasal spray Place 2 sprays into both nostrils daily. 01/30/18   [name], [name], MD  Multiple Vitamin (MULTIVITAMIN) tablet Take 1 tablet by mouth daily.    [provider]  nitroGLYCERIN (NITROSTAT) 0.4 MG SL tablet Place 1 tablet (0.4 mg total) under the tongue  every 5 (five) minutes as needed for chest pain (for chest pain). 06/21/16   Chilton Siandolph, Tiffany, MD  ondansetron (ZOFRAN ODT) 4 MG disintegrating tablet Take 1 tablet (4 mg total) by mouth every 8 (eight) hours as needed for nausea or vomiting. 09/04/19   Khatri, Hina, PA-C  sacubitril-valsartan (ENTRESTO) 24-26 MG Take 1 tablet by mouth 2 (two) times daily. 02/17/20   Chilton Siandolph, Tiffany, MD  spironolactone (ALDACTONE) 25 MG tablet TAKE 1/2 (ONE-HALF) TABLET BY MOUTH ONCE DAILY . (KEEP OFFICE VISIT) 12/15/19   Chilton Siandolph, Tiffany, MD    Family History    Family History  Problem Relation Age of Onset  . Hypertension Mother   . Bone cancer Mother   . Hyperlipidemia Father   . Bladder Cancer Father   . Diabetes Father    He indicated that his mother is deceased. He indicated that his father is deceased. He indicated that his  sister is alive. He indicated that both of his brothers are alive. He indicated that his maternal grandmother is deceased. He indicated that his maternal grandfather is deceased. He indicated that his paternal grandmother is deceased. He indicated that his paternal grandfather is deceased.  Social History    Social History   Socioeconomic History  . Marital status: Married    Spouse name: Not on file  . Number of children: Not on file  . Years of education: Not on file  . Highest education level: Not on file  Occupational History  . Not on file  Tobacco Use  . Smoking status: Former Smoker    Packs/day: 1.00    Years: 40.00    Pack years: 40.00    Types: Cigarettes    Start date: 31    Quit date: 2012    Years since quitting: 10.0  . Smokeless tobacco: Never Used  Vaping Use  . Vaping Use: Never used  Substance and Sexual Activity  . Alcohol use: No  . Drug use: No  . Sexual activity: Yes  Other Topics Concern  . Not on file  Social History Narrative  . Not on file   Social Determinants of Health   Financial Resource Strain: Not on file  Food  Insecurity: Not on file  Transportation Needs: Not on file  Physical Activity: Not on file  Stress: Not on file  Social Connections: Not on file  Intimate Partner Violence: Not on file     Review of Systems    General:  No chills, fever, night sweats or weight changes.  Cardiovascular:  No chest pain, dyspnea on exertion, edema, orthopnea, palpitations, paroxysmal nocturnal dyspnea. Dermatological: No rash, lesions/masses Respiratory: No cough, dyspnea Urologic: No hematuria, dysuria Abdominal:   No nausea, vomiting, diarrhea, bright red blood per rectum, melena, or hematemesis Neurologic:  No visual changes, wkns, changes in mental status. All other systems reviewed and are otherwise negative except as noted above.  Physical Exam    VS:  BP 112/60 (BP Location: Left Arm, Patient Position: Sitting)   Pulse 68   Ht 5\' 6"  (1.676 m)   Wt 191 lb 9.6 oz (86.9 kg)   SpO2 92%   BMI 30.93 kg/m  , BMI Body mass index is 30.93 kg/m. GEN: Well nourished, well developed, in no acute distress. HEENT: normal. Neck: Supple, no JVD, carotid bruits, or masses. Cardiac: RRR, no murmurs, rubs, or gallops. No clubbing, cyanosis, edema.  Radials/DP/PT 2+ and equal bilaterally.  Respiratory:  Respirations regular and unlabored, clear to auscultation bilaterally. GI: Soft, nontender, nondistended, BS + x 4. MS: no deformity or atrophy. Skin: warm and dry, no rash. Neuro:  Strength and sensation are intact. Psych: Normal affect.  Accessory Clinical Findings    Recent Labs: 12/18/2019: Hemoglobin 15.6; Platelets 185; TSH 1.090 03/26/2020: ALT 9; BUN 13; Creatinine, Ser 0.90; Potassium 5.0; Sodium 138   Recent Lipid Panel    Component Value Date/Time   CHOL 94 (L) 03/26/2020 1058   TRIG 82 03/26/2020 1058   HDL 34 (L) 03/26/2020 1058   CHOLHDL 2.8 03/26/2020 1058   CHOLHDL 3.3 04/11/2016 1035   VLDL 24 04/11/2016 1035   LDLCALC 43 03/26/2020 1058    ECG personally reviewed by me  today-none today. EKG 03/26/2019 normal sinus rhythm cannot rule out anterior infarct age undetermined 81 bpm- No acute changes  EKG 11/16/2017 Normal sinus rhythm septal infarct undetermined age 34 bpm  Echocardiogram 06/18/2015 Study Conclusions  - Left  ventricle: LVEF is severely depressed at approximately 35 to 40% with severe hypokinesis of the mid/distal lateral, mid/distal septal, distal inferior, apical wals. The cavity size was normal. - Ventricular septum: The contour showed systolic flattening.  Echocardiogram 01/02/2020  . Left ventricular ejection fraction, by estimation, is 15-20%. The left  ventricle has severely decreased function. The left ventricle demonstrates  global hypokinesis with apical akinesis. The left ventricular internal  cavity size was moderately  dilated. Left ventricular diastolic parameters are consistent with Grade I  diastolic dysfunction (impaired relaxation).  2. Right ventricular systolic function is normal. The right ventricular  size is normal. There is mildly elevated pulmonary artery systolic  pressure.  3. Left atrial size was mildly dilated.  4. The mitral valve is normal in structure. Trivial mitral valve  regurgitation.  5. The aortic valve is normal in structure. Aortic valve regurgitation is  trivial. Mild aortic valve sclerosis is present, with no evidence of  aortic valve stenosis.  6. Aortic dilatation noted. Aneurysm of the aortic root.  7. No evidence of LV thrombus with Definity contrast injection.   Nuclear stress test 8/21  Nuclear stress EF: 30%.  There was no ST segment deviation noted during stress.  Defect 1: There is a medium defect of severe severity present in the apex location.  Findings consistent with prior myocardial infarction.  This is a high risk study.  The left ventricular ejection fraction is moderately decreased (30-44%).  Abnormal, high risk stress nuclear study with prior apical  infarct but no ischemia. Gated ejection fraction was 30% with global hypokinesis worse at the apex. Moderate left ventricular enlargement.  Echocardiogram 06/14/2020 IMPRESSIONS    1. There has been no significant difference since the last study on  01/02/2020, LVEF remains severely decreased at 15-20%. There is akinesis of  all of the apical segments and no evidence for an apical thrombus on  Definity echocontrast images.  2. Left ventricular ejection fraction, by estimation, is <20%. The left  ventricle has severely decreased function. The left ventricle demonstrates  regional wall motion abnormalities (see scoring diagram/findings for  description). Left ventricular  diastolic parameters are consistent with Grade I diastolic dysfunction  (impaired relaxation). Elevated left atrial pressure.  3. Right ventricular systolic function is mildly reduced. The right  ventricular size is normal. There is normal pulmonary artery systolic  pressure. The estimated right ventricular systolic pressure is 22.4 mmHg.  4. The mitral valve is normal in structure. Mild mitral valve  regurgitation. No evidence of mitral stenosis.  5. The aortic valve is normal in structure. Aortic valve regurgitation is  not visualized. No aortic stenosis is present.  6. The inferior vena cava is normal in size with greater than 50%  respiratory variability, suggesting right atrial pressure of 3 mmHg.  Assessment & Plan   1.  Chronic systolic and diastolic CHF-no increased shortness of breath today.  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  Echocardiogram 8/21 showed LVEF of 15-20%, G1 DD, mildly dilated left atria, and trivial mitral valve regurgitation.  Trivial aortic valve regurgitation was also seen with mild aortic valve sclerosis.  Echocardiogram 06/14/2020 showed LVEF of 15-20%. Saint Jude ICD placed by EP on 10/2010.   Weight today stable 191 pounds pounds down from 212 pounds on  02/10/2019 Continue carvedilol 3.125 twice daily Continue Entresto 24/26 Continue spironolactone 12.5 mg daily Daily weights-keep log and bring to next visit-contact office with a weight increase of 3 pounds overnight or 5 pounds  in 1 week. Order BMP  Coronary artery disease-no chest pain today or increased dyspnea with exertion. MI 11/2002 and February 2012.  With his second MI he was treated with DES x1 to the LAD.  His LVEF was 35% and subsequently underwent implantation of an ICD.  Underwent nuclear stress test 01/05/2020 which showed old heart attack, and no ST segment deviation. Continue 81 mg aspirin daily Continue carvedilol 3.125 twice daily Continue atorvastatin 40 mg daily Heart healthy low-sodium diet-salty 6 given, diabetic diet information given. Increase physical activity as tolerated  Hyperlipidemia-03/26/2020: Cholesterol, Total 94; HDL 34; LDL Chol Calc (NIH) 43; Triglycerides 82 Continue atorvastatin 40 mg daily Heart healthy low-sodium high-fiber diet Increase physical activity as tolerated  Essential hypertension-BP today 112/60.  Well-controlled at home Continue carvedilol 3.125 twice daily Continue losartan daily 25 mg daily Heart healthy low-sodium diet  Increase physical activity as tolerated    Disposition: Follow-up with Dr. Duke Salvia in 3 months.  Thomasene Ripple. Curry Seefeldt NP-C    06/24/2020, 10:39 AM [facility] [address] Office [phone] Fax [phone]  Notice: This dictation was prepared with Dragon dictation along with smaller phrase technology. Any transcriptional errors that result from this process are unintentional and may not be corrected upon review.  I spent 15 minutes examining this patient, reviewing medications, and using patient centered shared decision making involving her cardiac care.  Prior to her visit I spent greater than 20 minutes reviewing her past medical history,  medications, and prior  cardiac tests.

## 2020-06-23 NOTE — Progress Notes (Signed)
Remote ICD transmission.   

## 2020-06-24 ENCOUNTER — Encounter: Payer: Self-pay | Admitting: General Practice

## 2020-06-24 ENCOUNTER — Other Ambulatory Visit: Payer: Self-pay

## 2020-06-24 ENCOUNTER — Ambulatory Visit: Payer: Medicare Other | Admitting: General Practice

## 2020-06-24 VITALS — BP 112/60 | HR 68 | Ht 66.0 in | Wt 191.6 lb

## 2020-06-24 DIAGNOSIS — I10 Essential (primary) hypertension: Secondary | ICD-10-CM

## 2020-06-24 DIAGNOSIS — I2510 Atherosclerotic heart disease of native coronary artery without angina pectoris: Secondary | ICD-10-CM

## 2020-06-24 DIAGNOSIS — I5022 Chronic systolic (congestive) heart failure: Secondary | ICD-10-CM | POA: Diagnosis not present

## 2020-06-24 DIAGNOSIS — E7800 Pure hypercholesterolemia, unspecified: Secondary | ICD-10-CM | POA: Diagnosis not present

## 2020-06-24 LAB — BASIC METABOLIC PANEL
BUN/Creatinine Ratio: 18 (ref 10–24)
BUN: 15 mg/dL (ref 8–27)
CO2: 27 mmol/L (ref 20–29)
Calcium: 9.3 mg/dL (ref 8.6–10.2)
Chloride: 100 mmol/L (ref 96–106)
Creatinine, Ser: 0.83 mg/dL (ref 0.76–1.27)
GFR calc Af Amer: 105 mL/min/{1.73_m2} (ref >59)
GFR calc non Af Amer: 90 mL/min/{1.73_m2} (ref >59)
Glucose: 94 mg/dL (ref 65–99)
Potassium: 5.2 mmol/L (ref 3.5–5.2)
Sodium: 139 mmol/L (ref 134–144)

## 2020-06-24 NOTE — Patient Instructions (Signed)
Medication Instructions:  The current medical regimen is effective;  continue present plan and medications as directed. Please refer to the Current Medication list given to you today.  *If you need a refill on your cardiac medications before your next appointment, please call your pharmacy*  Lab Work: BMET TODAY If you have labs (blood work) drawn today and your tests are completely normal, you will receive your results only by:  MyChart Message (if you have MyChart) OR A paper copy in the mail.  If you have any lab test that is abnormal or we need to change your treatment, we will call you to review the results. You may go to any [facility] that is convenient for you however, we do have a lab in our office that is able to assist you. You DO NOT need an appointment for our lab. The lab is open 8:00am and closes at 4:00pm. Lunch 12:45 - 1:45pm.  Special Instructions PLEASE READ AND FOLLOW SALTY 6-ATTACHED-1,800 mg daily  PLEASE INCREASE PHYSICAL ACTIVITY AS TOLERATED  Follow-Up: Your next appointment:  3 month(s) In Person with Chilton Si, MD ONLY   At [facility], you and your health needs are our priority.  As part of our continuing mission to provide you with exceptional heart care, we have created designated Provider Care Teams.  These Care Teams include your primary Cardiologist (physician) and Advanced Practice Providers (APPs -  Physician Assistants and Nurse Practitioners) who all work together to provide you with the care you need, when you need it.

## 2020-09-10 ENCOUNTER — Ambulatory Visit (INDEPENDENT_AMBULATORY_CARE_PROVIDER_SITE_OTHER): Payer: Medicare Other

## 2020-09-10 DIAGNOSIS — I255 Ischemic cardiomyopathy: Secondary | ICD-10-CM | POA: Diagnosis not present

## 2020-09-11 LAB — CUP PACEART REMOTE DEVICE CHECK
Battery Remaining Longevity: 24 mo
Battery Remaining Percentage: 20 %
Battery Voltage: 2.77 V
Brady Statistic RV Percent Paced: 1 %
Date Time Interrogation Session: 20220422032608
HighPow Impedance: 88 Ohm
HighPow Impedance: 88 Ohm
Implantable Lead Implant Date: 20120621
Implantable Lead Location: 753860
Implantable Pulse Generator Implant Date: 20120621
Lead Channel Impedance Value: 940 Ohm
Lead Channel Pacing Threshold Amplitude: 1.25 V
Lead Channel Pacing Threshold Pulse Width: 0.5 ms
Lead Channel Sensing Intrinsic Amplitude: 12 mV
Lead Channel Setting Pacing Amplitude: 2.5 V
Lead Channel Setting Pacing Pulse Width: 0.5 ms
Lead Channel Setting Sensing Sensitivity: 0.5 mV
Pulse Gen Serial Number: 819170

## 2020-09-16 ENCOUNTER — Other Ambulatory Visit: Payer: Self-pay | Admitting: Cardiovascular Disease

## 2020-09-21 ENCOUNTER — Encounter: Payer: Self-pay | Admitting: Cardiovascular Disease

## 2020-09-21 ENCOUNTER — Other Ambulatory Visit: Payer: Self-pay

## 2020-09-21 ENCOUNTER — Ambulatory Visit: Payer: Medicare Other | Admitting: Cardiovascular Disease

## 2020-09-21 VITALS — BP 100/70 | HR 75 | Ht 66.0 in | Wt 186.0 lb

## 2020-09-21 DIAGNOSIS — I5022 Chronic systolic (congestive) heart failure: Secondary | ICD-10-CM

## 2020-09-21 DIAGNOSIS — E119 Type 2 diabetes mellitus without complications: Secondary | ICD-10-CM

## 2020-09-21 DIAGNOSIS — E785 Hyperlipidemia, unspecified: Secondary | ICD-10-CM | POA: Diagnosis not present

## 2020-09-21 DIAGNOSIS — E1169 Type 2 diabetes mellitus with other specified complication: Secondary | ICD-10-CM

## 2020-09-21 DIAGNOSIS — E669 Obesity, unspecified: Secondary | ICD-10-CM

## 2020-09-21 DIAGNOSIS — I2510 Atherosclerotic heart disease of native coronary artery without angina pectoris: Secondary | ICD-10-CM

## 2020-09-21 DIAGNOSIS — I10 Essential (primary) hypertension: Secondary | ICD-10-CM | POA: Diagnosis not present

## 2020-09-21 HISTORY — DX: Obesity, unspecified: E11.69

## 2020-09-21 MED ORDER — DAPAGLIFLOZIN PROPANEDIOL 10 MG PO TABS: 10.0000 mg | ORAL_TABLET | Freq: Every day | ORAL | 3 refills | Status: DC

## 2020-09-21 NOTE — Patient Instructions (Signed)
Medication Instructions:  START FARXIGA 10 MG DAILY   *If you need a refill on your cardiac medications before your next appointment, please call your pharmacy*   Lab Work: NONE   If you have labs (blood work) drawn today and your tests are completely normal, you will receive your results only by: [name] MyChart Message (if you have MyChart) OR . A paper copy in the mail If you have any lab test that is abnormal or we need to change your treatment, we will call you to review the results.   Testing/Procedures: NONE   Follow-Up: At [facility], you and your health needs are our priority.  As part of our continuing mission to provide you with exceptional heart care, we have created designated Provider Care Teams.  These Care Teams include your primary Cardiologist (physician) and Advanced Practice Providers (APPs -  Physician Assistants and Nurse Practitioners) who all work together to provide you with the care you need, when you need it.  We recommend signing up for the patient portal called "MyChart".  Sign up information is provided on this After Visit Summary.  MyChart is used to connect with patients for Virtual Visits (Telemedicine).  Patients are able to view lab/test results, encounter notes, upcoming appointments, etc.  Non-urgent messages can be sent to your provider as well.   To learn more about what you can do with MyChart, go to [email or web address].    Your next appointment:   3 month(s)  The format for your next appointment:   In Person  Provider:   DR [facility] AT [facility] LOCATION

## 2020-09-21 NOTE — Assessment & Plan Note (Addendum)
Lipids are well-controlled.  LDL was 43 on 03/2020.  Continue atorvastatin.

## 2020-09-21 NOTE — Progress Notes (Signed)
Cardiology Office Note   Date:  09/21/2020   ID:  Spencer Horn, DOB Jun 04, 1950, MRN [id number]  PCP:  Kaleen Mask, MD  Cardiologist:   Chilton Si, MD  Electrophysiologist: Lewayne Bunting, MD  Chief Complaint  Patient presents with  . Follow-up    3 months.     History of Present Illness: Spencer Horn is a 70 y.o. male with CAD s/p MI, hypertension, chronic systolic and diastolic heart failure s/p ICD, erectile dysfunction, COPD, and hyperlipidemia who presents for follow up.  Spencer Horn was previously a patient of Dr. [name].  He had an MI in 2004 and 06/2010.  The second MI was treated with DES to the LAD.  He then had LVEF 35% and subsequently underwent implantation of an ICD.  He continues to complain of fatigue.  Blood counts, thyroid function, vitamin D and testosterone levels were all within normal limits.  Spencer Horn has struggled with exertional dyspnea.  His wife notes that he snores loudly but he has been persistently unwilling to get a sleep study.  He reports that he would not wear CPAP and therefore does not want to waste money or time on a test.  He was referred for PFTs that showed severe COPD and emphysema.  Entresto was switched back to losartan due to cost and the fact that his symptoms hadn't improved.  He was referred to Dr. [name] and was started on Spiriva.  The Spiriva was not helpful so he stopped using it.  He does use the albuterol rescue inhaler as needed.  He has been unable to afford most inhalers.  He reports that they make just a little too much to qualify for patient assistance programs.   Spencer Horn complained of increasing fatigue and exertional dyspnea.  He was referred for an echo 12/2019 that revealed LVEF 15 to 20%.  There is global hypokinesis with apical akinesis.  LV was moderately dilated.  He had grade 1 diastolic dysfunction and normal right ventricular function.  There is no significant valvular abnormality.  He had a Lexiscan Myoview at the  same time that revealed LVEF 30% with prior apical infarct and no ischemia.  He participated in cardiac rehab. He had an Echo 05/2020 that revealed LVEF 15-20%. He saw [name], NP on 06/2020 and was doing very well.   Today, he is accompanied by his wife, who also provides some history. He reports feeling well, and walks 10k-12k steps a day, with his main exercise being 4k steps in 30 min. Sometimes he notices minor foot pain after walking and on some afternoons and he describes a "prickly" feeling. He denies any chest pain, shortness of breath, or palpitations. No syncope or lightheadedness/dizziness to note. Also has no lower extremity edema, orthopnea or PND.  He saw Dr. Ladona Ridgel a couple months ago and his ICD has been stable.  He denies any ICD shocks.    Past Medical History:  Diagnosis Date  . Acute on chronic combined systolic and diastolic CHF (congestive heart failure) (HCC) 01/27/2020  . Coronary artery disease   . Diabetes mellitus type 2 in obese (HCC) 09/21/2020   Last hemoglobin A1c was 7.0%.  He has made significant diet and exercise changes since that time.  We will start Farxiga 10 mg daily for his heart failure.  When he gets repeat labs in a few months we will also repeat his hemoglobin A1c.  [name] Dyslipidemia   . Erectile dysfunction 08/18/2016  .  Fatigue 12/18/2019  . History of acute anterior wall MI   . Hyperlipidemia   . ICD (implantable cardiac defibrillator) in place June 2012  . Ischemic cardiomyopathy   . Personal history of MRSA (methicillin resistant Staphylococcus aureus)   . Tobacco abuse   . Ventricular dysfunction 07/07/2010   Severe left ventricular dysfunction with ejection fraction of 30-35%    Past Surgical History:  Procedure Laterality Date  . CARDIAC CATHETERIZATION  07/07/2010   DES to proximal LAD  . CARDIAC CATHETERIZATION  2004  . EP IMPLANTABLE DEVICE  June 2012     Current Outpatient Medications  Medication Sig Dispense Refill  . aspirin 81  MG tablet Take 81 mg by mouth daily.    [name]. atorvastatin (LIPITOR) 40 MG tablet Take 1 tablet by mouth once daily 90 tablet 3  . carvedilol (COREG) 3.125 MG tablet Take 1 tablet by mouth twice daily 180 tablet 3  . dapagliflozin propanediol (FARXIGA) 10 MG TABS tablet Take 1 tablet (10 mg total) by mouth daily before breakfast. 90 tablet 3  . fluticasone (FLONASE) 50 MCG/ACT nasal spray Place 2 sprays into both nostrils daily. 16 g 6  . Multiple Vitamin (MULTIVITAMIN) tablet Take 1 tablet by mouth daily.    . nitroGLYCERIN (NITROSTAT) 0.4 MG SL tablet Place 1 tablet (0.4 mg total) under the tongue every 5 (five) minutes as needed for chest pain (for chest pain). 25 tablet 6  . ondansetron (ZOFRAN ODT) 4 MG disintegrating tablet Take 1 tablet (4 mg total) by mouth every 8 (eight) hours as needed for nausea or vomiting. 3 tablet 0  . sacubitril-valsartan (ENTRESTO) 24-26 MG Take 1 tablet by mouth 2 (two) times daily. 180 tablet 3  . spironolactone (ALDACTONE) 25 MG tablet TAKE 1/2 (ONE-HALF) TABLET BY MOUTH ONCE DAILY . APPOINTMENT REQUIRED FOR FUTURE REFILLS 45 tablet 2   No current facility-administered medications for this visit.    Allergies:   Patient has no known allergies.    Social History:  The patient  reports that he quit smoking about 10 years ago. His smoking use included cigarettes. He started smoking about 50 years ago. He has a 40.00 pack-year smoking history. He has never used smokeless tobacco. He reports that he does not drink alcohol and does not use drugs.   Family History:  The patient's family history includes Bladder Cancer in his father; Bone cancer in his mother; Diabetes in his father; Hyperlipidemia in his father; Hypertension in his mother.    ROS:   Please see the history of present illness. (+) Foot pain All other systems are reviewed and negative.  [name].    PHYSICAL EXAM: VS:  BP 100/70 (BP Location: Left Arm, Patient Position: Sitting, Cuff Size: Normal)    Pulse 75   Ht 5\' 6"  (1.676 m)   Wt 186 lb (84.4 kg)   BMI 30.02 kg/m  , BMI Body mass index is 30.02 kg/m. GENERAL:  Well appearing HEENT: Pupils equal round and reactive, fundi not visualized, oral mucosa unremarkable NECK:  No jugular venous distention, waveform within normal limits, carotid upstroke brisk and symmetric, no bruits LUNGS:  Clear to auscultation bilaterally HEART:  RRR.  PMI not displaced or sustained,S1 and S2 within normal limits, no S3, no S4, no clicks, no rubs, no murmurs ABD:  Flat, positive bowel sounds normal in frequency in pitch, no bruits, no rebound, no guarding, no midline pulsatile mass, no hepatomegaly, no splenomegaly EXT:  2 plus pulses throughout, no edema, no  cyanosis no clubbing SKIN:  No rashes no nodules NEURO:  Cranial nerves II through XII grossly intact, motor grossly intact throughout [facility]:  Cognitively intact, oriented to person place and time   EKG:   10/12/15: sinus rhythm rate 68 bpm.  Low voltage limb leads and precordial leads.  Prior anteroseptal infarct.   08/16/16: sinus rhythm.  Rate 76 bpm.  Prior anteroseptal infarct.  Lateral TWI.  04/17/17: Sinus rhythm.  Rate 74 bpm.  Prior septal infarct.   Sinus rhythm.  Rate 66 bpm.  Prior septal infarct.  Anterolateral T wave inversions unchanged from prior. 12/18/2019: Sinus rhythm.  Rate 78 bpm.  Prior anterior infarct.  Lateral T wave inversions. 03/26/2020: EKG was not ordered. 09/21/2020: EKG is not ordered today.   Echo 06/18/15: Study Conclusions  - Left ventricle: LVEF is severely depressed at approximately 35 to  40% with severe hypokinesis of the mid/distal lateral, mid/distal  septal, distal inferior, apical wals. The cavity size was normal. - Ventricular septum: The contour showed systolic flattening.   Echo 01/02/20: 1. Left ventricular ejection fraction, by estimation, is 15-20%. The left  ventricle has severely decreased function. The left ventricle demonstrates  global  hypokinesis with apical akinesis. The left ventricular internal  cavity size was moderately  dilated. Left ventricular diastolic parameters are consistent with Grade I  diastolic dysfunction (impaired relaxation).  2. Right ventricular systolic function is normal. The right ventricular  size is normal. There is mildly elevated pulmonary artery systolic  pressure.  3. Left atrial size was mildly dilated.  4. The mitral valve is normal in structure. Trivial mitral valve  regurgitation.  5. The aortic valve is normal in structure. Aortic valve regurgitation is  trivial. Mild aortic valve sclerosis is present, with no evidence of  aortic valve stenosis.  6. Aortic dilatation noted. Aneurysm of the aortic root.  7. No evidence of LV thrombus with Definity contrast injection.   Lexiscan Myoview 12/2019:  Nuclear stress EF: 30%.  There was no ST segment deviation noted during stress.  Defect 1: There is a medium defect of severe severity present in the apex location.  Findings consistent with prior myocardial infarction.  This is a high risk study.  The left ventricular ejection fraction is moderately decreased (30-44%).   Abnormal, high risk stress nuclear study with prior apical infarct but no ischemia.  Gated ejection fraction was 30% with global hypokinesis worse at the apex.  Moderate left ventricular enlargement.    Echo 06/14/2020: 1. There has been no significant difference since the last study on  01/02/2020, LVEF remains severely decreased at 15-20%. There is akinesis of  all of the apical segments and no evidence for an apical thrombus on  Definity echocontrast images.  2. Left ventricular ejection fraction, by estimation, is <20%. The left  ventricle has severely decreased function. The left ventricle demonstrates  regional wall motion abnormalities (see scoring diagram/findings for  description). Left ventricular  diastolic parameters are consistent with Grade I  diastolic dysfunction  (impaired relaxation). Elevated left atrial pressure.  3. Right ventricular systolic function is mildly reduced. The right  ventricular size is normal. There is normal pulmonary artery systolic  pressure. The estimated right ventricular systolic pressure is 22.4 mmHg.  4. The mitral valve is normal in structure. Mild mitral valve  regurgitation. No evidence of mitral stenosis.  5. The aortic valve is normal in structure. Aortic valve regurgitation is  not visualized. No aortic stenosis is present.  6. The inferior  vena cava is normal in size with greater than 50%  respiratory variability, suggesting right atrial pressure of 3 mmHg.    Recent Labs: 12/18/2019: Hemoglobin 15.6; Platelets 185; TSH 1.090 03/26/2020: ALT 9 06/24/2020: BUN 15; Creatinine, Ser 0.83; Potassium 5.2; Sodium 139    Lipid Panel    Component Value Date/Time   CHOL 94 (L) 03/26/2020 1058   TRIG 82 03/26/2020 1058   HDL 34 (L) 03/26/2020 1058   CHOLHDL 2.8 03/26/2020 1058   CHOLHDL 3.3 04/11/2016 1035   VLDL 24 04/11/2016 1035   LDLCALC 43 03/26/2020 1058       Wt Readings from Last 3 Encounters:  09/21/20 186 lb (84.4 kg)  06/24/20 191 lb 9.6 oz (86.9 kg)  04/21/20 197 lb (89.4 kg)      ASSESSMENT AND PLAN: Chronic systolic heart failure LVEF 15 to 20%.  He is doing very well.  He is euvolemic on exam.  Blood pressures are somewhat low but he has no symptoms.  Continue carvedilol, Entresto, and spironolactone.  We will add Farxiga 10 mg daily.  We will check an A1c at follow-up given that he has had prediabetes.  He was congratulated on his diet and exercise.  He is also done a great job losing weight.  ICD is in place.  Hypertension Blood pressure well-controlled on current regimen.  Hyperlipidemia Lipids are well-controlled.  LDL was 43 on 03/2020.  Continue atorvastatin.    Coronary artery disease Stable.  He has no angina.  Continue aspirin, atorvastatin, and carvedilol.   There is no ischemia on his most recent [facility] 12/2019.  Diabetes mellitus type 2 in obese (HCC) Last hemoglobin A1c was 7.0%.  He has made significant diet and exercise changes since that time.  We will start Farxiga 10 mg daily for his heart failure.  When he gets repeat labs in a few months we will also repeat his hemoglobin A1c.    Current medicines are reviewed at length with the patient today.  The patient does not have concerns regarding medicines.  The following changes have been made: Start Farxiga  Labs/ tests ordered today include:   No orders of the defined types were placed in this encounter.    Disposition:   FU with Kadeisha Betsch C. Duke Salvia, MD, [facility] in 3 months.   I,Mathew Stumpf,acting as a Neurosurgeon for Chilton Si, MD.,have documented all relevant documentation on the behalf of Chilton Si, MD,as directed by  Chilton Si, MD while in the presence of Chilton Si, MD.  I, Lashawnna Lambrecht C. Duke Salvia, MD have reviewed all documentation for this visit.  The documentation of the exam, diagnosis, procedures, and orders on 09/21/2020 are all accurate and complete.   Signed, Marcques Wrightsman C. Duke Salvia, MD, [facility]  09/21/2020 1:01 PM    [facility]

## 2020-09-21 NOTE — Assessment & Plan Note (Addendum)
LVEF 15 to 20%.  He is doing very well.  He is euvolemic on exam.  Blood pressures are somewhat low but he has no symptoms.  Continue carvedilol, Entresto, and spironolactone.  We will add Farxiga 10 mg daily.  We will check an A1c at follow-up given that he has had prediabetes.  He was congratulated on his diet and exercise.  He is also done a great job losing weight.  ICD is in place.

## 2020-09-21 NOTE — Assessment & Plan Note (Signed)
Blood pressure well controlled on current regimen

## 2020-09-21 NOTE — Assessment & Plan Note (Signed)
Last hemoglobin A1c was 7.0%.  He has made significant diet and exercise changes since that time.  We will start Farxiga 10 mg daily for his heart failure.  When he gets repeat labs in a few months we will also repeat his hemoglobin A1c.

## 2020-09-21 NOTE — Assessment & Plan Note (Signed)
Stable.  He has no angina.  Continue aspirin, atorvastatin, and carvedilol.  There is no ischemia on his most recent [facility] 12/2019.

## 2020-09-29 NOTE — Progress Notes (Signed)
Remote ICD transmission.   

## 2020-10-06 ENCOUNTER — Encounter (HOSPITAL_BASED_OUTPATIENT_CLINIC_OR_DEPARTMENT_OTHER): Payer: Self-pay

## 2020-12-10 ENCOUNTER — Other Ambulatory Visit: Payer: Self-pay

## 2020-12-10 ENCOUNTER — Ambulatory Visit (INDEPENDENT_AMBULATORY_CARE_PROVIDER_SITE_OTHER): Payer: Medicare Other

## 2020-12-10 DIAGNOSIS — I255 Ischemic cardiomyopathy: Secondary | ICD-10-CM

## 2020-12-10 MED ORDER — SACUBITRIL-VALSARTAN 24-26 MG PO TABS: 1.0000 | ORAL_TABLET | Freq: Two times a day (BID) | ORAL | 3 refills | Status: DC

## 2020-12-13 LAB — CUP PACEART REMOTE DEVICE CHECK
Battery Remaining Longevity: 20 mo
Battery Remaining Percentage: 17 %
Battery Voltage: 2.75 V
Brady Statistic RV Percent Paced: 1 %
Date Time Interrogation Session: 20220722035823
HighPow Impedance: 93 Ohm
HighPow Impedance: 93 Ohm
Implantable Lead Implant Date: 20120621
Implantable Lead Location: 753860
Implantable Pulse Generator Implant Date: 20120621
Lead Channel Impedance Value: 980 Ohm
Lead Channel Pacing Threshold Amplitude: 1.25 V
Lead Channel Pacing Threshold Pulse Width: 0.5 ms
Lead Channel Sensing Intrinsic Amplitude: 10.7 mV
Lead Channel Setting Pacing Amplitude: 2.5 V
Lead Channel Setting Pacing Pulse Width: 0.5 ms
Lead Channel Setting Sensing Sensitivity: 0.5 mV
Pulse Gen Serial Number: 819170

## 2021-01-03 ENCOUNTER — Other Ambulatory Visit: Payer: Self-pay | Admitting: Cardiovascular Disease

## 2021-01-03 NOTE — Progress Notes (Signed)
Remote ICD transmission.   

## 2021-02-07 ENCOUNTER — Telehealth: Payer: Self-pay | Admitting: Cardiovascular Disease

## 2021-02-07 MED ORDER — SACUBITRIL-VALSARTAN 24-26 MG PO TABS: 1.0000 | ORAL_TABLET | Freq: Two times a day (BID) | ORAL | 1 refills | Status: DC

## 2021-02-07 NOTE — Telephone Encounter (Signed)
   Pt c/o medication issue:  1. Name of Medication: sacubitril-valsartan (ENTRESTO) 24-26 MG  2. How are you currently taking this medication (dosage and times per day)? Take 1 tablet by mouth 2 (two) times daily.  3. Are you having a reaction (difficulty breathing--STAT)?   4. What is your medication issue? Pt's wife calling she said [employer] contacts her saying pt ran out of prescription and to send it to their fax : [facility] [phone] or [phone]. She said they need it right away since pt is running out of meds

## 2021-02-07 NOTE — Telephone Encounter (Signed)
Pts wife advised that the RX has been sent to his [facility] and his chart has been updated with the new Pharmacy information. Pt to keep his follow up appt.

## 2021-02-14 ENCOUNTER — Telehealth: Payer: Self-pay | Admitting: Cardiovascular Disease

## 2021-02-14 ENCOUNTER — Other Ambulatory Visit: Payer: Self-pay

## 2021-02-14 MED ORDER — SACUBITRIL-VALSARTAN 24-26 MG PO TABS: 1.0000 | ORAL_TABLET | Freq: Two times a day (BID) | ORAL | 1 refills | Status: DC

## 2021-02-14 NOTE — Telephone Encounter (Signed)
*  STAT* If patient is at the pharmacy, call can be transferred to refill team.   1. Which medications need to be refilled? (please list name of each medication and dose if known) Entresto   2. Which pharmacy/location (including street and city if local pharmacy) is medication to be sent to? [phone] fax number they need another sent to them  3. Do they need a 30 day or 90 day supply? 90 month supply  Patient is out

## 2021-02-15 ENCOUNTER — Telehealth: Payer: Self-pay | Admitting: Cardiovascular Disease

## 2021-02-15 NOTE — Telephone Encounter (Signed)
Returned call to patient's wife (okay per DPR) who states that she is having trouble getting patients [place]. Advised patient's wife that upon chart review patient has been approved for patient assistance for his Farxiga until 05/21/21. Provided patient's wife with number to [place] patient assistance and advised her to reach out to them to check on status of shipment. Patient's wife also states that patient has about 2 weeks of medication left. I advised her that I would forward message to [place]. Patient's wife verbalized understanding.

## 2021-02-15 NOTE — Telephone Encounter (Signed)
Pt c/o medication issue:  1. Name of Medication:  dapagliflozin propanediol (FARXIGA) 10 MG TABS tablet  2. How are you currently taking this medication (dosage and times per day)?   3. Are you having a reaction (difficulty breathing--STAT)?   4. What is your medication issue?   Patient's wife states the patient is unable to afford this medication and is interested in assistance with purchasing. She states received assistance through [employer] in the past and she assumes this will need to be renewed. Please assist.

## 2021-02-16 NOTE — Telephone Encounter (Signed)
   Pt's wife calling, she said refill should be sent to [facility] with fax#  [phone]. They are getting entresto through [employer]

## 2021-02-16 NOTE — Telephone Encounter (Signed)
Left message to call back  

## 2021-02-17 ENCOUNTER — Ambulatory Visit (HOSPITAL_BASED_OUTPATIENT_CLINIC_OR_DEPARTMENT_OTHER): Payer: Medicare Other | Admitting: Cardiovascular Disease

## 2021-02-17 NOTE — Telephone Encounter (Signed)
Spoke with wife and patient is needing a refill sent to patient assistance on [address] [employer] and spoke with Loura Back [phone] and gave verbal  Advised wife they would call and verify address to get shipped after Rx processed

## 2021-02-25 ENCOUNTER — Encounter (HOSPITAL_BASED_OUTPATIENT_CLINIC_OR_DEPARTMENT_OTHER): Payer: Self-pay

## 2021-02-25 NOTE — Telephone Encounter (Signed)
I spoke with the pts wife and I advised her that I spoke with Tawanna Cooler the Pharm D at [employer] patient assistance... and his reports that the RX for Sherryll Burger is ready to be shipped out early next week that she called a little too soon.. but she was advised to call early to be sure no delay in his meds so he will reach out to her a give her instructions on when to reach out for his refills.   She will call us if he has any trouble getting his shipment.    Pharm D [phone]

## 2021-03-03 ENCOUNTER — Other Ambulatory Visit (HOSPITAL_BASED_OUTPATIENT_CLINIC_OR_DEPARTMENT_OTHER): Payer: Self-pay | Admitting: *Deleted

## 2021-03-03 MED ORDER — SACUBITRIL-VALSARTAN 24-26 MG PO TABS: 1.0000 | ORAL_TABLET | Freq: Two times a day (BID) | ORAL | 1 refills | Status: DC

## 2021-03-03 MED ORDER — DAPAGLIFLOZIN PROPANEDIOL 10 MG PO TABS: 10.0000 mg | ORAL_TABLET | Freq: Every day | ORAL | 3 refills | Status: DC

## 2021-03-03 NOTE — Telephone Encounter (Signed)
Received mychart message from wife patient needing Rx for [facility] sent to [facility] with patient and she thinks he is also needing one sent for [place] as well Sent both to [facility] [employer] [facility]

## 2021-03-11 ENCOUNTER — Ambulatory Visit (INDEPENDENT_AMBULATORY_CARE_PROVIDER_SITE_OTHER): Payer: Medicare Other

## 2021-03-11 DIAGNOSIS — I255 Ischemic cardiomyopathy: Secondary | ICD-10-CM | POA: Diagnosis not present

## 2021-03-13 LAB — CUP PACEART REMOTE DEVICE CHECK
Battery Remaining Longevity: 18 mo
Battery Remaining Percentage: 14 %
Battery Voltage: 2.72 V
Brady Statistic RV Percent Paced: 1 %
Date Time Interrogation Session: 20221021022719
HighPow Impedance: 92 Ohm
HighPow Impedance: 92 Ohm
Implantable Lead Implant Date: 20120621
Implantable Lead Location: 753860
Implantable Pulse Generator Implant Date: 20120621
Lead Channel Impedance Value: 1000 Ohm
Lead Channel Pacing Threshold Amplitude: 1.25 V
Lead Channel Pacing Threshold Pulse Width: 0.5 ms
Lead Channel Sensing Intrinsic Amplitude: 12 mV
Lead Channel Setting Pacing Amplitude: 2.5 V
Lead Channel Setting Pacing Pulse Width: 0.5 ms
Lead Channel Setting Sensing Sensitivity: 0.5 mV
Pulse Gen Serial Number: 819170

## 2021-03-21 NOTE — Progress Notes (Signed)
Remote ICD transmission.   

## 2021-03-28 ENCOUNTER — Ambulatory Visit (HOSPITAL_BASED_OUTPATIENT_CLINIC_OR_DEPARTMENT_OTHER): Payer: Medicare Other | Admitting: Cardiovascular Disease

## 2021-03-28 ENCOUNTER — Other Ambulatory Visit: Payer: Self-pay

## 2021-03-28 ENCOUNTER — Encounter (HOSPITAL_BASED_OUTPATIENT_CLINIC_OR_DEPARTMENT_OTHER): Payer: Self-pay | Admitting: Cardiovascular Disease

## 2021-03-28 VITALS — BP 112/72 | HR 69 | Ht 66.0 in | Wt 190.2 lb

## 2021-03-28 DIAGNOSIS — I5042 Chronic combined systolic (congestive) and diastolic (congestive) heart failure: Secondary | ICD-10-CM | POA: Diagnosis not present

## 2021-03-28 DIAGNOSIS — I10 Essential (primary) hypertension: Secondary | ICD-10-CM | POA: Diagnosis not present

## 2021-03-28 DIAGNOSIS — I5022 Chronic systolic (congestive) heart failure: Secondary | ICD-10-CM | POA: Diagnosis not present

## 2021-03-28 DIAGNOSIS — E785 Hyperlipidemia, unspecified: Secondary | ICD-10-CM

## 2021-03-28 DIAGNOSIS — E669 Obesity, unspecified: Secondary | ICD-10-CM

## 2021-03-28 DIAGNOSIS — E1169 Type 2 diabetes mellitus with other specified complication: Secondary | ICD-10-CM | POA: Diagnosis not present

## 2021-03-28 NOTE — Assessment & Plan Note (Signed)
-  Continue Farxiga 

## 2021-03-28 NOTE — Assessment & Plan Note (Signed)
Lipids are well have been controlled.  LDL was 43.  Continue atorvastatin.

## 2021-03-28 NOTE — Assessment & Plan Note (Addendum)
LVEF less than 20%.  Clinically he is doing well and euvolemic.  He is exercising well and is on great guideline directed medical therapy.  Continue carvedilol, Marcelline Deist, Entresto, and spironolactone.  He has a defibrillator and is stable.

## 2021-03-28 NOTE — Assessment & Plan Note (Signed)
Blood pressure well-controlled on his current regimen.

## 2021-03-28 NOTE — Progress Notes (Signed)
Cardiology Office Note   Date:  03/28/2021   ID:  Spencer Horn, DOB 1950-07-02, MRN [id number]  PCP:  Kaleen Mask, MD  Cardiologist:   Chilton Si, MD  Electrophysiologist: Lewayne Bunting, MD  No chief complaint on file.    History of Present Illness: Spencer Horn is a 70 y.o. male with CAD s/p MI, hypertension, chronic systolic and diastolic heart failure s/p ICD, erectile dysfunction, COPD, and hyperlipidemia who presents for follow up.  [name] was previously a patient of Dr. [name].  He had an MI in 2004 and 06/2010.  The second MI was treated with DES to the LAD.  He then had LVEF 35% and subsequently underwent implantation of an ICD.  He continues to complain of fatigue.  Blood counts, thyroid function, vitamin D and testosterone levels were all within normal limits.  [name] has struggled with exertional dyspnea.  His wife notes that he snores loudly but he has been persistently unwilling to get a sleep study.  He reports that he would not wear CPAP and therefore does not want to waste money or time on a test.  He was referred for PFTs that showed severe COPD and emphysema.  Entresto was switched back to losartan due to cost and the fact that his symptoms hadn't improved.  He was referred to Dr. [name] and was started on Spiriva.  The Spiriva was not helpful so he stopped using it.  He does use the albuterol rescue inhaler as needed.  He has been unable to afford most inhalers.  He reports that they make just a little too much to qualify for patient assistance programs.   [name] complained of increasing fatigue and exertional dyspnea.  He was referred for an echo 12/2019 that revealed LVEF 15 to 20%.  There is global hypokinesis with apical akinesis.  LV was moderately dilated.  He had grade 1 diastolic dysfunction and normal right ventricular function.  There is no significant valvular abnormality.  He had a Lexiscan Myoview at the same time that revealed LVEF 30% with  prior apical infarct and no ischemia.  He participated in cardiac rehab. He had an Echo 05/2020 that revealed LVEF 15-20%. He saw [name], NP on 06/2020 and was doing very well.   Since his last appointment [name] has been doing well.  His wife had hip surgery so he was on caring for her.  He is starting to get back into his exercise routine.  He has no exertional chest pain or shortness of breath.  He has no LE edema, orthopnea or PND.  He walks for exercise.  He hasn't had any chest pain or shortness of breath,.  His BP has been well-controlled.    Past Medical History:  Diagnosis Date   Acute on chronic combined systolic and diastolic CHF (congestive heart failure) (HCC) 01/27/2020   Chronic combined systolic and diastolic heart failure (HCC) 01/27/2020   Coronary artery disease    Diabetes mellitus type 2 in obese (HCC) 09/21/2020   Last hemoglobin A1c was 7.0%.  He has made significant diet and exercise changes since that time.  We will start Farxiga 10 mg daily for his heart failure.  When he gets repeat labs in a few months we will also repeat his hemoglobin A1c.   Dyslipidemia    Erectile dysfunction 08/18/2016   Fatigue 12/18/2019   History of acute anterior wall MI    Hyperlipidemia    ICD (implantable  cardiac defibrillator) in place June 2012   Ischemic cardiomyopathy    Personal history of MRSA (methicillin resistant Staphylococcus aureus)    Tobacco abuse    Ventricular dysfunction 07/07/2010   Severe left ventricular dysfunction with ejection fraction of 30-35%    Past Surgical History:  Procedure Laterality Date   CARDIAC CATHETERIZATION  07/07/2010   DES to proximal LAD   CARDIAC CATHETERIZATION  2004   EP IMPLANTABLE DEVICE  June 2012     Current Outpatient Medications  Medication Sig Dispense Refill   aspirin 81 MG tablet Take 81 mg by mouth daily.     atorvastatin (LIPITOR) 40 MG tablet Take 1 tablet by mouth once daily 90 tablet 3   carvedilol (COREG) 3.125 MG  tablet Take 1 tablet by mouth twice daily 180 tablet 2   dapagliflozin propanediol (FARXIGA) 10 MG TABS tablet Take 1 tablet (10 mg total) by mouth daily before breakfast. 90 tablet 3   fluticasone (FLONASE) 50 MCG/ACT nasal spray Place 2 sprays into both nostrils daily. 16 g 6   Multiple Vitamin (MULTIVITAMIN) tablet Take 1 tablet by mouth daily.     nitroGLYCERIN (NITROSTAT) 0.4 MG SL tablet Place 1 tablet (0.4 mg total) under the tongue Horn 5 (five) minutes as needed for chest pain (for chest pain). 25 tablet 6   ondansetron (ZOFRAN ODT) 4 MG disintegrating tablet Take 1 tablet (4 mg total) by mouth Horn 8 (eight) hours as needed for nausea or vomiting. 3 tablet 0   sacubitril-valsartan (ENTRESTO) 24-26 MG Take 1 tablet by mouth 2 (two) times daily. 180 tablet 1   spironolactone (ALDACTONE) 25 MG tablet TAKE 1/2 (ONE-HALF) TABLET BY MOUTH ONCE DAILY . APPOINTMENT REQUIRED FOR FUTURE REFILLS 45 tablet 2   No current facility-administered medications for this visit.    Allergies:   Patient has no known allergies.    Social History:  The patient  reports that he quit smoking about 10 years ago. His smoking use included cigarettes. He started smoking about 50 years ago. He has a 40.00 pack-year smoking history. He has never used smokeless tobacco. He reports that he does not drink alcohol and does not use drugs.   Family History:  The patient's family history includes Bladder Cancer in his father; Bone cancer in his mother; Diabetes in his father; Hyperlipidemia in his father; Hypertension in his mother.    ROS:   Please see the history of present illness. (+) Foot pain All other systems are reviewed and negative.    PHYSICAL EXAM: VS:  BP 112/72 (BP Location: Left Arm, Patient Position: Sitting, Cuff Size: Large)   Pulse 69   Ht 5\' 6"  (1.676 m)   Wt 190 lb 3.2 oz (86.3 kg)   BMI 30.70 kg/m  , BMI Body mass index is 30.7 kg/m. GENERAL:  Well appearing HEENT: Pupils equal round  and reactive, fundi not visualized, oral mucosa unremarkable NECK:  No jugular venous distention, waveform within normal limits, carotid upstroke brisk and symmetric, no bruits LUNGS:  Clear to auscultation bilaterally HEART:  RRR.  PMI not displaced or sustained,S1 and S2 within normal limits, no S3, no S4, no clicks, no rubs, no murmurs ABD:  Flat, positive bowel sounds normal in frequency in pitch, no bruits, no rebound, no guarding, no midline pulsatile mass, no hepatomegaly, no splenomegaly EXT:  2 plus pulses throughout, no edema, no cyanosis no clubbing SKIN:  No rashes no nodules NEURO:  Cranial nerves II through XII grossly intact, motor  grossly intact throughout [facility]:  Cognitively intact, oriented to person place and time   EKG:   10/12/15: sinus rhythm rate 68 bpm.  Low voltage limb leads and precordial leads.  Prior anteroseptal infarct.   08/16/16: sinus rhythm.  Rate 76 bpm.  Prior anteroseptal infarct.  Lateral TWI.  04/17/17: Sinus rhythm.  Rate 74 bpm.  Prior septal infarct.   Sinus rhythm.  Rate 66 bpm.  Prior septal infarct.  Anterolateral T wave inversions unchanged from prior. 12/18/2019: Sinus rhythm.  Rate 78 bpm.  Prior anterior infarct.  Lateral T wave inversions. 03/26/2020: EKG was not ordered. 09/21/2020: EKG is not ordered today. 03/28/2021: Sinus rhythm.  Rate 69 bpm.  LAD.  Prior anteroseptal infarct.   Echo 06/18/15: Study Conclusions   - Left ventricle: LVEF is severely depressed at approximately 35 to   40% with severe hypokinesis of the mid/distal lateral, mid/distal   septal, distal inferior, apical wals. The cavity size was normal. - Ventricular septum: The contour showed systolic flattening.    Echo 01/02/20: 1. Left ventricular ejection fraction, by estimation, is 15-20%. The left  ventricle has severely decreased function. The left ventricle demonstrates  global hypokinesis with apical akinesis. The left ventricular internal  cavity size was  moderately  dilated. Left ventricular diastolic parameters are consistent with Grade I  diastolic dysfunction (impaired relaxation).   2. Right ventricular systolic function is normal. The right ventricular  size is normal. There is mildly elevated pulmonary artery systolic  pressure.   3. Left atrial size was mildly dilated.   4. The mitral valve is normal in structure. Trivial mitral valve  regurgitation.   5. The aortic valve is normal in structure. Aortic valve regurgitation is  trivial. Mild aortic valve sclerosis is present, with no evidence of  aortic valve stenosis.   6. Aortic dilatation noted. Aneurysm of the aortic root.   7. No evidence of LV thrombus with Definity contrast injection.   Lexiscan Myoview 12/2019: Nuclear stress EF: 30%. There was no ST segment deviation noted during stress. Defect 1: There is a medium defect of severe severity present in the apex location. Findings consistent with prior myocardial infarction. This is a high risk study. The left ventricular ejection fraction is moderately decreased (30-44%).   Abnormal, high risk stress nuclear study with prior apical infarct but no ischemia.  Gated ejection fraction was 30% with global hypokinesis worse at the apex.  Moderate left ventricular enlargement.     Echo 06/14/2020:  1. There has been no significant difference since the last study on  01/02/2020, LVEF remains severely decreased at 15-20%. There is akinesis of  all of the apical segments and no evidence for an apical thrombus on  Definity echocontrast images.   2. Left ventricular ejection fraction, by estimation, is <20%. The left  ventricle has severely decreased function. The left ventricle demonstrates  regional wall motion abnormalities (see scoring diagram/findings for  description). Left ventricular  diastolic parameters are consistent with Grade I diastolic dysfunction  (impaired relaxation). Elevated left atrial pressure.   3. Right  ventricular systolic function is mildly reduced. The right  ventricular size is normal. There is normal pulmonary artery systolic  pressure. The estimated right ventricular systolic pressure is 22.4 mmHg.   4. The mitral valve is normal in structure. Mild mitral valve  regurgitation. No evidence of mitral stenosis.   5. The aortic valve is normal in structure. Aortic valve regurgitation is  not visualized. No aortic stenosis is present.  6. The inferior vena cava is normal in size with greater than 50%  respiratory variability, suggesting right atrial pressure of 3 mmHg.    Recent Labs: 06/24/2020: BUN 15; Creatinine, Ser 0.83; Potassium 5.2; Sodium 139    Lipid Panel    Component Value Date/Time   CHOL 94 (L) 03/26/2020 1058   TRIG 82 03/26/2020 1058   HDL 34 (L) 03/26/2020 1058   CHOLHDL 2.8 03/26/2020 1058   CHOLHDL 3.3 04/11/2016 1035   VLDL 24 04/11/2016 1035   LDLCALC 43 03/26/2020 1058       Wt Readings from Last 3 Encounters:  03/28/21 190 lb 3.2 oz (86.3 kg)  09/21/20 186 lb (84.4 kg)  06/24/20 191 lb 9.6 oz (86.9 kg)      ASSESSMENT AND PLAN:  Chronic combined systolic and diastolic heart failure (HCC) LVEF less than 20%.  Clinically he is doing well and euvolemic.  He is exercising well and is on great guideline directed medical therapy.  Continue carvedilol, Marcelline Deist, Entresto, and spironolactone.  He has a defibrillator and is stable.  Hypertension Blood pressure well-controlled on his current regimen.  Diabetes mellitus type 2 in obese (HCC) Continue Farxiga.  Hyperlipidemia Lipids are well have been controlled.  LDL was 43.  Continue atorvastatin.    Current medicines are reviewed at length with the patient today.  The patient does not have concerns regarding medicines.  The following changes have been made:  Labs/ tests ordered today include:   No orders of the defined types were placed in this encounter.    Disposition:   FU with Zeshan Sena C.  Duke Salvia, MD, [facility] in 6 months.     Signed, [place] Basham C. Duke Salvia, MD, [facility]  03/28/2021 11:52 PM    [facility]

## 2021-03-28 NOTE — Patient Instructions (Signed)
Medication Instructions:  Your physician recommends that you continue on your current medications as directed. Please refer to the Current Medication list given to you today.   *If you need a refill on your cardiac medications before your next appointment, please call your pharmacy*  Lab Work: NONE  Testing/Procedures: NONE  Follow-Up: At [facility], you and your health needs are our priority.  As part of our continuing mission to provide you with exceptional heart care, we have created designated Provider Care Teams.  These Care Teams include your primary Cardiologist (physician) and Advanced Practice Providers (APPs -  Physician Assistants and Nurse Practitioners) who all work together to provide you with the care you need, when you need it.  We recommend signing up for the patient portal called "MyChart".  Sign up information is provided on this After Visit Summary.  MyChart is used to connect with patients for Virtual Visits (Telemedicine).  Patients are able to view lab/test results, encounter notes, upcoming appointments, etc.  Non-urgent messages can be sent to your provider as well.   To learn more about what you can do with MyChart, go to [email or web address].    Your next appointment:   6 month(s)  The format for your next appointment:   In Person  Provider:   Tiffany Hopewell, MD            

## 2021-03-29 ENCOUNTER — Encounter (HOSPITAL_BASED_OUTPATIENT_CLINIC_OR_DEPARTMENT_OTHER): Payer: Self-pay

## 2021-03-29 NOTE — Addendum Note (Signed)
Addended by: Debbe Bales on: 03/29/2021 08:56 AM   Modules accepted: Orders

## 2021-04-18 ENCOUNTER — Other Ambulatory Visit (HOSPITAL_BASED_OUTPATIENT_CLINIC_OR_DEPARTMENT_OTHER): Payer: Self-pay | Admitting: *Deleted

## 2021-04-18 MED ORDER — SACUBITRIL-VALSARTAN 24-26 MG PO TABS: 1.0000 | ORAL_TABLET | Freq: Two times a day (BID) | ORAL | 3 refills | Status: DC

## 2021-04-18 NOTE — Telephone Encounter (Signed)
Printed Entresto Rx to fax for patient assistance

## 2021-05-30 ENCOUNTER — Encounter (HOSPITAL_BASED_OUTPATIENT_CLINIC_OR_DEPARTMENT_OTHER): Payer: Self-pay

## 2021-05-30 ENCOUNTER — Telehealth (HOSPITAL_BASED_OUTPATIENT_CLINIC_OR_DEPARTMENT_OTHER): Payer: Self-pay | Admitting: *Deleted

## 2021-05-30 NOTE — Telephone Encounter (Signed)
Received letter from [employer] that [employer] patient assistance has been approved through 05/21/2022 Mychart message sent to patient

## 2021-06-10 ENCOUNTER — Ambulatory Visit (INDEPENDENT_AMBULATORY_CARE_PROVIDER_SITE_OTHER): Payer: Medicare Other

## 2021-06-10 DIAGNOSIS — I255 Ischemic cardiomyopathy: Secondary | ICD-10-CM

## 2021-06-10 LAB — CUP PACEART REMOTE DEVICE CHECK
Battery Remaining Longevity: 12 mo
Battery Remaining Percentage: 10 %
Battery Voltage: 2.68 V
Brady Statistic RV Percent Paced: 1 %
Date Time Interrogation Session: 20230120020016
HighPow Impedance: 93 Ohm
HighPow Impedance: 93 Ohm
Implantable Lead Implant Date: 20120621
Implantable Lead Location: 753860
Implantable Pulse Generator Implant Date: 20120621
Lead Channel Impedance Value: 1000 Ohm
Lead Channel Pacing Threshold Amplitude: 1.25 V
Lead Channel Pacing Threshold Pulse Width: 0.5 ms
Lead Channel Sensing Intrinsic Amplitude: 12 mV
Lead Channel Setting Pacing Amplitude: 2.5 V
Lead Channel Setting Pacing Pulse Width: 0.5 ms
Lead Channel Setting Sensing Sensitivity: 0.5 mV
Pulse Gen Serial Number: 819170

## 2021-06-20 ENCOUNTER — Other Ambulatory Visit: Payer: Self-pay | Admitting: Cardiovascular Disease

## 2021-06-23 NOTE — Progress Notes (Signed)
Remote ICD transmission.   

## 2021-07-11 ENCOUNTER — Other Ambulatory Visit: Payer: Self-pay | Admitting: Internal Medicine

## 2021-09-09 ENCOUNTER — Ambulatory Visit (INDEPENDENT_AMBULATORY_CARE_PROVIDER_SITE_OTHER): Payer: Medicare Other

## 2021-09-09 DIAGNOSIS — I255 Ischemic cardiomyopathy: Secondary | ICD-10-CM

## 2021-09-09 DIAGNOSIS — I5022 Chronic systolic (congestive) heart failure: Secondary | ICD-10-CM

## 2021-09-09 LAB — CUP PACEART REMOTE DEVICE CHECK
Battery Remaining Longevity: 9 mo
Battery Remaining Percentage: 7 %
Battery Voltage: 2.65 V
Brady Statistic RV Percent Paced: 1 %
Date Time Interrogation Session: 20230421020016
HighPow Impedance: 98 Ohm
HighPow Impedance: 98 Ohm
Implantable Lead Implant Date: 20120621
Implantable Lead Location: 753860
Implantable Pulse Generator Implant Date: 20120621
Lead Channel Impedance Value: 1000 Ohm
Lead Channel Pacing Threshold Amplitude: 1.25 V
Lead Channel Pacing Threshold Pulse Width: 0.5 ms
Lead Channel Sensing Intrinsic Amplitude: 12 mV
Lead Channel Setting Pacing Amplitude: 2.5 V
Lead Channel Setting Pacing Pulse Width: 0.5 ms
Lead Channel Setting Sensing Sensitivity: 0.5 mV
Pulse Gen Serial Number: 819170

## 2021-09-27 NOTE — Progress Notes (Signed)
Remote ICD transmission.   

## 2021-09-29 ENCOUNTER — Encounter (HOSPITAL_BASED_OUTPATIENT_CLINIC_OR_DEPARTMENT_OTHER): Payer: Self-pay | Admitting: Cardiovascular Disease

## 2021-09-29 ENCOUNTER — Ambulatory Visit (HOSPITAL_BASED_OUTPATIENT_CLINIC_OR_DEPARTMENT_OTHER): Payer: Medicare Other | Admitting: Cardiovascular Disease

## 2021-09-29 VITALS — BP 110/70 | HR 71 | Ht 66.0 in | Wt 189.3 lb

## 2021-09-29 DIAGNOSIS — E7800 Pure hypercholesterolemia, unspecified: Secondary | ICD-10-CM

## 2021-09-29 DIAGNOSIS — I10 Essential (primary) hypertension: Secondary | ICD-10-CM

## 2021-09-29 DIAGNOSIS — I2510 Atherosclerotic heart disease of native coronary artery without angina pectoris: Secondary | ICD-10-CM

## 2021-09-29 DIAGNOSIS — I5042 Chronic combined systolic (congestive) and diastolic (congestive) heart failure: Secondary | ICD-10-CM | POA: Diagnosis not present

## 2021-09-29 DIAGNOSIS — Z5181 Encounter for therapeutic drug level monitoring: Secondary | ICD-10-CM

## 2021-09-29 DIAGNOSIS — F419 Anxiety disorder, unspecified: Secondary | ICD-10-CM

## 2021-09-29 NOTE — Assessment & Plan Note (Signed)
Generally does well but does get anxious at times.  His wife request that I provide him with a medication for this.  Recommended that he see his PCP to discuss this as the [facility] medical board does not recommend cardiology prescribing benzodiazepines. ?

## 2021-09-29 NOTE — Assessment & Plan Note (Signed)
LVEF 15-20% most recently.  He is euvolemic and doing well.  BP well-controlled.  Continue carvedilol, Entresto, Farxiga, spironolactone.  His last echo was 05/2020.  Recommend getting a repeat echo.  He would like to defer doing that at this time.  He is doing great.  Encouraged him to keep getting regular exercise. ?

## 2021-09-29 NOTE — Assessment & Plan Note (Signed)
Prior MRI with unknown interventions in 2004 and an LAD stent in 2012.  He is currently doing well and has no angina.  Yesterday he did have some chest tightness in the setting of being upset that his wife's doctor's appointment was so late.  However he has been working in his yard and going on walks without any angina.  We will continue to monitor for now.  Continue aspirin, carvedilol, and atorvastatin.  Check lipids and CMP today. ?

## 2021-09-29 NOTE — Assessment & Plan Note (Signed)
Blood pressure is very well controlled on carvedilol, Entresto, and spironolactone.  Check CMP today. ?

## 2021-09-29 NOTE — Patient Instructions (Signed)
Medication Instructions:  ?Your physician recommends that you continue on your current medications as directed. Please refer to the Current Medication list given to you today. ? ?*If you need a refill on your cardiac medications before your next appointment, please call your pharmacy* ? ?Lab Work: ?LP/CMET TODAY  ? ?If you have labs (blood work) drawn today and your tests are completely normal, you will receive your results only by: ?MyChart Message (if you have MyChart) OR ?A paper copy in the mail ?If you have any lab test that is abnormal or we need to change your treatment, we will call you to review the results. ? ?Testing/Procedures: ?NONE ? ?Follow-Up: ?At [facility], you and your health needs are our priority.  As part of our continuing mission to provide you with exceptional heart care, we have created designated Provider Care Teams.  These Care Teams include your primary Cardiologist (physician) and Advanced Practice Providers (APPs -  Physician Assistants and Nurse Practitioners) who all work together to provide you with the care you need, when you need it. ? ?We recommend signing up for the patient portal called "MyChart".  Sign up information is provided on this After Visit Summary.  MyChart is used to connect with patients for Virtual Visits (Telemedicine).  Patients are able to view lab/test results, encounter notes, upcoming appointments, etc.  Non-urgent messages can be sent to your provider as well.   ?To learn more about what you can do with MyChart, go to [email or web address].   ? ?Your next appointment:   ?6 month(s) ? ?The format for your next appointment:   ?In Person ? ?Provider:   ?Chilton Si, MD ?  ? ? ? ? ? ? ? ? ? ?

## 2021-09-29 NOTE — Assessment & Plan Note (Signed)
LDL goal is less than 70.  Continue atorvastatin and check fasting lipids and CMP today. ?

## 2021-09-29 NOTE — Progress Notes (Signed)
? ? ?Cardiology Office Note ? ? ?Date:  09/29/2021  ? ?ID:  Spencer Horn, DOB May 21, 1951, MRN [id number] ? ?PCP:  Kaleen MaskElkins, Wilson Oliver, MD  ?Cardiologist:   Chilton Siiffany [place], MD  ?Electrophysiologist: [name], MD ? ?No chief complaint on file. ? ? ?History of Present Illness: ?Spencer AlbeeClaude I Key is a 71 y.o. male with CAD s/p MI, hypertension, chronic systolic and diastolic heart failure s/p ICD, erectile dysfunction, COPD, and hyperlipidemia who presents for follow up.  Spencer Horn was previously a patient of [name].  He had an MI in 2004 and 06/2010.  The second MI was treated with DES to the LAD.  He then had LVEF 35% and subsequently underwent implantation of an ICD.  He continues to complain of fatigue.  Blood counts, thyroid function, vitamin D and testosterone levels were all within normal limits.  Spencer Horn has struggled with exertional dyspnea.  His wife notes that he snores loudly but he has been persistently unwilling to get a sleep study.  He reports that he would not wear CPAP and therefore does not want to waste money or time on a test.  He was referred for PFTs that showed severe COPD and emphysema.  Entresto was switched back to losartan due to cost and the fact that his symptoms hadn't improved.  He was referred to Dr. Marchelle Gearingamaswamy and was started on Spiriva.  The Spiriva was not helpful so he stopped using it.  He does use the albuterol rescue inhaler as needed.  He has been unable to afford most inhalers.  He reports that they make just a little too much to qualify for patient assistance programs.  ? ?Spencer Horn complained of increasing fatigue and exertional dyspnea.  He was referred for an echo 12/2019 that revealed LVEF 15 to 20%.  There is global hypokinesis with apical akinesis.  LV was moderately dilated.  He had grade 1 diastolic dysfunction and normal right ventricular function.  There is no significant valvular abnormality.  He had a Lexiscan Myoview at the same time that revealed LVEF 30% with  prior apical infarct and no ischemia.  He participated in cardiac rehab. He had an Echo 05/2020 that revealed LVEF 15-20%. He saw Edd FabianJesse Cleaver, NP on 06/2020 and was doing very well.  At his last appointment he was doing well and had started exercising.  He sees Dr. Ladona Ridgelaylor for his device and things have been stable. ? ?Spencer Horn has been doing well.  He weighs himself daily and his weight has ranged 182.4 to 184.4 pounds.  He has been very active at home.  He cares for his lawn or walks for 30 minutes each day.  He has no exertional chest pain or shortness of breath.  He denies lower extremity edema, orthopnea, or PND.  He has not been lightheaded or dizzy.  His wife notes that sometimes he gets anxious and wonders if he can have medication for this. ? ?Past Medical History:  ?Diagnosis Date  ? Acute on chronic combined systolic and diastolic CHF (congestive heart failure) (HCC) 01/27/2020  ? Chronic combined systolic and diastolic heart failure (HCC) 01/27/2020  ? Coronary artery disease   ? Diabetes mellitus type 2 in obese (HCC) 09/21/2020  ? Last hemoglobin A1c was 7.0%.  He has made significant diet and exercise changes since that time.  We will start Farxiga 10 mg daily for his heart failure.  When he gets repeat labs in a few months we will also repeat  his hemoglobin A1c.  ? Dyslipidemia   ? Erectile dysfunction 08/18/2016  ? Fatigue 12/18/2019  ? History of acute anterior wall MI   ? Hyperlipidemia   ? ICD (implantable cardiac defibrillator) in place June 2012  ? Ischemic cardiomyopathy   ? Personal history of MRSA (methicillin resistant Staphylococcus aureus)   ? Tobacco abuse   ? Ventricular dysfunction 07/07/2010  ? Severe left ventricular dysfunction with ejection fraction of 30-35%  ? ? ?Past Surgical History:  ?Procedure Laterality Date  ? CARDIAC CATHETERIZATION  07/07/2010  ? DES to proximal LAD  ? CARDIAC CATHETERIZATION  2004  ? EP IMPLANTABLE DEVICE  June 2012  ? ? ? ?Current Outpatient Medications   ?Medication Sig Dispense Refill  ? aspirin 81 MG tablet Take 81 mg by mouth daily.    ? atorvastatin (LIPITOR) 40 MG tablet Take 1 tablet by mouth once daily 90 tablet 0  ? carvedilol (COREG) 3.125 MG tablet Take 1 tablet by mouth twice daily 180 tablet 2  ? dapagliflozin propanediol (FARXIGA) 10 MG TABS tablet Take 1 tablet (10 mg total) by mouth daily before breakfast. 90 tablet 3  ? fluticasone (FLONASE) 50 MCG/ACT nasal spray Place 2 sprays into both nostrils daily. 16 g 6  ? Multiple Vitamin (MULTIVITAMIN) tablet Take 1 tablet by mouth daily.    ? nitroGLYCERIN (NITROSTAT) 0.4 MG SL tablet Place 1 tablet (0.4 mg total) under the tongue every 5 (five) minutes as needed for chest pain (for chest pain). 25 tablet 6  ? ondansetron (ZOFRAN ODT) 4 MG disintegrating tablet Take 1 tablet (4 mg total) by mouth every 8 (eight) hours as needed for nausea or vomiting. 3 tablet 0  ? sacubitril-valsartan (ENTRESTO) 24-26 MG Take 1 tablet by mouth 2 (two) times daily. 180 tablet 3  ? spironolactone (ALDACTONE) 25 MG tablet TAKE 1/2 (ONE-HALF) TABLET BY MOUTH ONCE DAILY(APPT REQUIRED FOR FUTURE REFILLS) 45 tablet 2  ? ?No current facility-administered medications for this visit.  ? ? ?Allergies:   Patient has no known allergies.  ? ? ?Social History:  The patient  reports that he quit smoking about 11 years ago. His smoking use included cigarettes. He started smoking about 51 years ago. He has a 40.00 pack-year smoking history. He has never used smokeless tobacco. He reports that he does not drink alcohol and does not use drugs.  ? ?Family History:  The patient's family history includes Bladder Cancer in his father; Bone cancer in his mother; Diabetes in his father; Hyperlipidemia in his father; Hypertension in his mother.  ? ? ?ROS:   ?Please see the history of present illness. ?(+) Foot pain ?All other systems are reviewed and negative.  ? ? ?PHYSICAL EXAM: ?VS:  BP 110/70 (BP Location: Left Arm, Patient Position: Sitting,  Cuff Size: Normal)   Pulse 71   Ht 5\' 6"  (1.676 m)   Wt 189 lb 4.8 oz (85.9 kg)   BMI 30.55 kg/m?  , BMI Body mass index is 30.55 kg/m?. ?GENERAL:  Well appearing ?HEENT: Pupils equal round and reactive, fundi not visualized, oral mucosa unremarkable ?NECK:  No jugular venous distention, waveform within normal limits, carotid upstroke brisk and symmetric, no bruits, no thyromegaly ?LUNGS:  Clear to auscultation bilaterally ?HEART:  RRR.  PMI not displaced or sustained,S1 and S2 within normal limits, no S3, no S4, no clicks, no rubs, no murmurs ?ABD:  Flat, positive bowel sounds normal in frequency in pitch, no bruits, no rebound, no guarding, no midline pulsatile  mass, no hepatomegaly, no splenomegaly ?EXT:  2 plus pulses throughout, no edema, no cyanosis no clubbing ?SKIN:  No rashes no nodules ?NEURO:  Cranial nerves II through XII grossly intact, motor grossly intact throughout ?PSYCH:  Cognitively intact, oriented to person place and time ? ? ?EKG:   ?10/12/15: sinus rhythm rate 68 bpm.  Low voltage limb leads and precordial leads.  Prior anteroseptal infarct.   ?08/16/16: sinus rhythm.  Rate 76 bpm.  Prior anteroseptal infarct.  Lateral TWI.  ?04/17/17: Sinus rhythm.  Rate 74 bpm.  Prior septal infarct.   ?Sinus rhythm.  Rate 66 bpm.  Prior septal infarct.  Anterolateral T wave inversions unchanged from prior. ?12/18/2019: Sinus rhythm.  Rate 78 bpm.  Prior anterior infarct.  Lateral T wave inversions. ?03/26/2020: EKG was not ordered. ?09/21/2020: EKG is not ordered today. ?03/28/2021: Sinus rhythm.  Rate 69 bpm.  LAD.  Prior anteroseptal infarct. ?09/29/2021: Sinus arrhythmia.  Rate 71 bpm.  Prior anteroseptal infarct.  Left axis deviation.  Nonspecific T wave abnormalities. ? ? ?Echo 06/18/15: ?Study Conclusions ?  ?- Left ventricle: LVEF is severely depressed at approximately 35 to ?  40% with severe hypokinesis of the mid/distal lateral, mid/distal ?  septal, distal inferior, apical wals. The cavity size was  normal. ?- Ventricular septum: The contour showed systolic flattening. ?  ? ?Echo 01/02/20: ?1. Left ventricular ejection fraction, by estimation, is 15-20%. The left  ?ventricle has severely decreased function. The l

## 2021-09-30 LAB — COMPREHENSIVE METABOLIC PANEL
ALT: 12 IU/L (ref 0–44)
AST: 17 IU/L (ref 0–40)
Albumin/Globulin Ratio: 1.4 (ref 1.2–2.2)
Albumin: 4.2 g/dL (ref 3.8–4.8)
Alkaline Phosphatase: 81 IU/L (ref 44–121)
BUN/Creatinine Ratio: 18 (ref 10–24)
BUN: 15 mg/dL (ref 8–27)
Bilirubin Total: 0.5 mg/dL (ref 0.0–1.2)
CO2: 23 mmol/L (ref 20–29)
Calcium: 9.6 mg/dL (ref 8.6–10.2)
Chloride: 102 mmol/L (ref 96–106)
Creatinine, Ser: 0.85 mg/dL (ref 0.76–1.27)
Globulin, Total: 3 g/dL (ref 1.5–4.5)
Glucose: 115 mg/dL — ABNORMAL HIGH (ref 70–99)
Potassium: 5.3 mmol/L — ABNORMAL HIGH (ref 3.5–5.2)
Sodium: 139 mmol/L (ref 134–144)
Total Protein: 7.2 g/dL (ref 6.0–8.5)
eGFR: 94 mL/min/{1.73_m2} (ref >59)

## 2021-09-30 LAB — LIPID PANEL
Chol/HDL Ratio: 3.5 ratio (ref 0.0–5.0)
Cholesterol, Total: 120 mg/dL (ref 100–199)
HDL: 34 mg/dL — ABNORMAL LOW (ref >39)
LDL Chol Calc (NIH): 56 mg/dL (ref 0–99)
Triglycerides: 181 mg/dL — ABNORMAL HIGH (ref 0–149)
VLDL Cholesterol Cal: 30 mg/dL (ref 5–40)

## 2021-10-11 ENCOUNTER — Other Ambulatory Visit: Payer: Self-pay | Admitting: Internal Medicine

## 2021-10-17 ENCOUNTER — Other Ambulatory Visit: Payer: Self-pay | Admitting: Cardiovascular Disease

## 2021-10-18 NOTE — Telephone Encounter (Signed)
Rx(s) sent to pharmacy electronically.  

## 2021-10-31 ENCOUNTER — Encounter: Payer: Self-pay | Admitting: Internal Medicine

## 2021-10-31 ENCOUNTER — Ambulatory Visit: Payer: Medicare Other | Admitting: Internal Medicine

## 2021-10-31 VITALS — BP 112/80 | HR 72 | Ht 66.0 in | Wt 191.8 lb

## 2021-10-31 DIAGNOSIS — I5042 Chronic combined systolic (congestive) and diastolic (congestive) heart failure: Secondary | ICD-10-CM | POA: Diagnosis not present

## 2021-10-31 DIAGNOSIS — I2510 Atherosclerotic heart disease of native coronary artery without angina pectoris: Secondary | ICD-10-CM

## 2021-10-31 DIAGNOSIS — Z95810 Presence of automatic (implantable) cardiac defibrillator: Secondary | ICD-10-CM

## 2021-10-31 MED ORDER — SPIRONOLACTONE 25 MG PO TABS: ORAL_TABLET | ORAL | 2 refills | Status: DC

## 2021-10-31 NOTE — Patient Instructions (Addendum)
Medication Instructions:  Your physician recommends that you continue on your current medications as directed. Please refer to the Current Medication list given to you today.  Labwork: None ordered.  Testing/Procedures: Your physician has requested that you have an echocardiogram. Echocardiography is a painless test that uses sound waves to create images of your heart. It provides your doctor with information about the size and shape of your heart and how well your heart's chambers and valves are working. This procedure takes approximately one hour. There are no restrictions for this procedure.   Please schedule for Echo in October or November 2023.    Follow-Up: Your physician wants you to follow-up in: one year with Cristopher Peru, MD or one of the following Advanced Practice Providers on your designated Care Team:   Tommye Standard, [place] Legrand Como "Jonni Sanger" Chalmers Cater, [place]  Remote monitoring is used to monitor your ICD from home. This monitoring reduces the number of office visits required to check your device to one time per year. It allows [place] to keep an eye on the functioning of your device to ensure it is working properly. You are scheduled for a device check from home on 12/09/2021. You may send your transmission at any time that day. If you have a wireless device, the transmission will be sent automatically. After your physician reviews your transmission, you will receive a postcard with your next transmission date.  Any Other Special Instructions Will Be Listed Below (If Applicable).  If you need a refill on your cardiac medications before your next appointment, please call your pharmacy.   Important Information About Sugar

## 2021-10-31 NOTE — Progress Notes (Signed)
HPI Spencer Horn returns today for followup. He is a pleasant 71 yo man with a h/o chronic systolic heart failure, EF 30%, HTN and obesity s/p ICD insertion. He has undergone cardiac rehab and lost weight and feels better. He had Covid and his EF decreased. He is pending a repeat echo later this year. He has class 2 symptoms.   No Known Allergies   Current Outpatient Medications  Medication Sig Dispense Refill   aspirin 81 MG tablet Take 81 mg by mouth daily.     atorvastatin (LIPITOR) 40 MG tablet Take 1 tablet by mouth once daily 30 tablet 0   carvedilol (COREG) 3.125 MG tablet Take 1 tablet by mouth twice daily 180 tablet 2   dapagliflozin propanediol (FARXIGA) 10 MG TABS tablet Take 1 tablet (10 mg total) by mouth daily before breakfast. 90 tablet 3   fluticasone (FLONASE) 50 MCG/ACT nasal spray Place 2 sprays into both nostrils daily. 16 g 6   Multiple Vitamin (MULTIVITAMIN) tablet Take 1 tablet by mouth daily.     nitroGLYCERIN (NITROSTAT) 0.4 MG SL tablet Place 1 tablet (0.4 mg total) under the tongue every 5 (five) minutes as needed for chest pain (for chest pain). 25 tablet 6   ondansetron (ZOFRAN ODT) 4 MG disintegrating tablet Take 1 tablet (4 mg total) by mouth every 8 (eight) hours as needed for nausea or vomiting. 3 tablet 0   sacubitril-valsartan (ENTRESTO) 24-26 MG Take 1 tablet by mouth 2 (two) times daily. 180 tablet 3   spironolactone (ALDACTONE) 25 MG tablet TAKE 1/2 (ONE-HALF) TABLET BY MOUTH ONCE DAILY(APPT REQUIRED FOR FUTURE REFILLS) 45 tablet 2   No current facility-administered medications for this visit.     Past Medical History:  Diagnosis Date   Acute on chronic combined systolic and diastolic CHF (congestive heart failure) (HCC) 01/27/2020   Chronic combined systolic and diastolic heart failure (HCC) 01/27/2020   Coronary artery disease    Diabetes mellitus type 2 in obese (HCC) 09/21/2020   Last hemoglobin A1c was 7.0%.  He has made significant diet and  exercise changes since that time.  We will start Farxiga 10 mg daily for his heart failure.  When he gets repeat labs in a few months we will also repeat his hemoglobin A1c.   Dyslipidemia    Erectile dysfunction 08/18/2016   Fatigue 12/18/2019   History of acute anterior wall MI    Hyperlipidemia    ICD (implantable cardiac defibrillator) in place June 2012   Ischemic cardiomyopathy    Personal history of MRSA (methicillin resistant Staphylococcus aureus)    Tobacco abuse    Ventricular dysfunction 07/07/2010   Severe left ventricular dysfunction with ejection fraction of 30-35%    ROS:   All systems reviewed and negative except as noted in the HPI.   Past Surgical History:  Procedure Laterality Date   CARDIAC CATHETERIZATION  07/07/2010   DES to proximal LAD   CARDIAC CATHETERIZATION  2004   EP IMPLANTABLE DEVICE  June 2012     Family History  Problem Relation Age of Onset   Hypertension Mother    Bone cancer Mother    Hyperlipidemia Father    Bladder Cancer Father    Diabetes Father      Social History   Socioeconomic History   Marital status: Married    Spouse name: Not on file   Number of children: Not on file   Years of education: Not on file  Highest education level: Not on file  Occupational History   Not on file  Tobacco Use   Smoking status: Former    Packs/day: 1.00    Years: 40.00    Total pack years: 40.00    Types: Cigarettes    Start date: 79    Quit date: 2012    Years since quitting: 11.4   Smokeless tobacco: Never  Vaping Use   Vaping Use: Never used  Substance and Sexual Activity   Alcohol use: No   Drug use: No   Sexual activity: Yes  Other Topics Concern   Not on file  Social History Narrative   Not on file   Social Determinants of Health   Financial Resource Strain: Not on file  Food Insecurity: Not on file  Transportation Needs: Not on file  Physical Activity: Not on file  Stress: Not on file  Social Connections: Not  on file  Intimate Partner Violence: Not on file     BP 112/80   Pulse 72   Ht 5\' 6"  (1.676 m)   Wt 191 lb 12.8 oz (87 kg)   SpO2 94%   BMI 30.96 kg/m   Physical Exam:  Well appearing NAD HEENT: Unremarkable Neck:  No JVD, no thyromegally Lymphatics:  No adenopathy Back:  No CVA tenderness Lungs:  Clear with no wheezes HEART:  Regular rate rhythm, no murmurs, no rubs, no clicks Abd:  soft, positive bowel sounds, no organomegally, no rebound, no guarding Ext:  2 plus pulses, no edema, no cyanosis, no clubbing Skin:  No rashes no nodules Neuro:  CN II through XII intact, motor grossly intact  DEVICE  Normal device function.  See PaceArt for details.   Assess/Plan:  1. Chronic systolic heart failure - his symptoms are class 2. He will continue his current meds. 2. ICD - his [employer]. Jude ICD is working normally. We will recheck in several months. His RV threshold is up a bit. We will follow. If his EF remains under 35%, I would suggest ICD gen change out but if it improves markedly we could consider not placing a new device. 3. Obesity - he has lost 6 more lbs. He is encouraged to lose more weight. 4. CAD -  He is s/p MI, he denies anginal symptoms.    Trayson Stitely,MD

## 2021-11-24 ENCOUNTER — Other Ambulatory Visit: Payer: Self-pay | Admitting: Internal Medicine

## 2021-12-01 ENCOUNTER — Ambulatory Visit (INDEPENDENT_AMBULATORY_CARE_PROVIDER_SITE_OTHER): Payer: Medicare Other

## 2021-12-01 DIAGNOSIS — I255 Ischemic cardiomyopathy: Secondary | ICD-10-CM

## 2021-12-02 LAB — CUP PACEART REMOTE DEVICE CHECK
Battery Remaining Longevity: 7 mo
Battery Remaining Percentage: 6 %
Battery Voltage: 2.65 V
Brady Statistic RV Percent Paced: 1 %
Date Time Interrogation Session: 20230714123823
HighPow Impedance: 95 Ohm
HighPow Impedance: 95 Ohm
Implantable Lead Implant Date: 20120621
Implantable Lead Location: 753860
Implantable Pulse Generator Implant Date: 20120621
Lead Channel Impedance Value: 1000 Ohm
Lead Channel Pacing Threshold Amplitude: 2 V
Lead Channel Pacing Threshold Pulse Width: 0.5 ms
Lead Channel Sensing Intrinsic Amplitude: 12 mV
Lead Channel Setting Pacing Amplitude: 3.5 V
Lead Channel Setting Pacing Pulse Width: 0.8 ms
Lead Channel Setting Sensing Sensitivity: 0.5 mV
Pulse Gen Serial Number: 819170

## 2021-12-09 ENCOUNTER — Telehealth: Payer: Self-pay | Admitting: Cardiovascular Disease

## 2021-12-09 MED ORDER — DAPAGLIFLOZIN PROPANEDIOL 10 MG PO TABS: 10.0000 mg | ORAL_TABLET | Freq: Every day | ORAL | 1 refills | Status: DC

## 2021-12-09 NOTE — Telephone Encounter (Signed)
Rx request sent to pharmacy.  

## 2021-12-09 NOTE — Telephone Encounter (Signed)
*  STAT* If patient is at the pharmacy, call can be transferred to refill team.   1. Which medications need to be refilled? (please list name of each medication and dose if known)   dapagliflozin propanediol (FARXIGA) 10 MG TABS tablet    2. Which pharmacy/location (including street and city if local pharmacy) is medication to be sent to? [facility] [employer] - [place], [place] - [address]  3. Do they need a 30 day or 90 day supply? 90

## 2021-12-19 ENCOUNTER — Encounter (HOSPITAL_BASED_OUTPATIENT_CLINIC_OR_DEPARTMENT_OTHER): Payer: Self-pay | Admitting: Cardiovascular Disease

## 2021-12-19 MED ORDER — DAPAGLIFLOZIN PROPANEDIOL 10 MG PO TABS: 10.0000 mg | ORAL_TABLET | Freq: Every day | ORAL | 3 refills | Status: DC

## 2021-12-19 NOTE — Addendum Note (Signed)
Addended by: Regis Bill B on: 12/19/2021 05:45 PM   Modules accepted: Orders

## 2021-12-19 NOTE — Progress Notes (Signed)
Remote ICD transmission.   

## 2021-12-19 NOTE — Addendum Note (Signed)
Addended by: Elease Etienne A on: 12/19/2021 09:22 AM   Modules accepted: Level of Service

## 2021-12-26 ENCOUNTER — Telehealth (HOSPITAL_BASED_OUTPATIENT_CLINIC_OR_DEPARTMENT_OTHER): Payer: Self-pay | Admitting: *Deleted

## 2021-12-26 NOTE — Telephone Encounter (Signed)
Hey, are you able to follow up on this patient assistance?

## 2021-12-26 NOTE — Telephone Encounter (Signed)
FARXIGA 10 MG #2 LOT [id number] EXP 02-19-24 At front desk for pick up

## 2021-12-29 ENCOUNTER — Telehealth (HOSPITAL_BASED_OUTPATIENT_CLINIC_OR_DEPARTMENT_OTHER): Payer: Self-pay

## 2021-12-29 MED ORDER — SACUBITRIL-VALSARTAN 24-26 MG PO TABS: 1.0000 | ORAL_TABLET | Freq: Two times a day (BID) | ORAL | 2 refills | Status: DC

## 2021-12-29 NOTE — Telephone Encounter (Signed)
Received fax from [facility] [employer] [facility] requesting refills for Entresto. Rx request sent to pharmacy.

## 2022-01-02 ENCOUNTER — Ambulatory Visit: Payer: Medicare Other

## 2022-01-03 LAB — CUP PACEART REMOTE DEVICE CHECK
Battery Remaining Longevity: 7 mo
Battery Remaining Percentage: 6 %
Battery Voltage: 2.63 V
Brady Statistic RV Percent Paced: 1 %
Date Time Interrogation Session: 20230814021432
HighPow Impedance: 95 Ohm
HighPow Impedance: 95 Ohm
Implantable Lead Implant Date: 20120621
Implantable Lead Location: 753860
Implantable Pulse Generator Implant Date: 20120621
Lead Channel Impedance Value: 1050 Ohm
Lead Channel Pacing Threshold Amplitude: 2 V
Lead Channel Pacing Threshold Pulse Width: 0.5 ms
Lead Channel Sensing Intrinsic Amplitude: 12 mV
Lead Channel Setting Pacing Amplitude: 3.5 V
Lead Channel Setting Pacing Pulse Width: 0.8 ms
Lead Channel Setting Sensing Sensitivity: 0.5 mV
Pulse Gen Serial Number: 819170

## 2022-02-02 ENCOUNTER — Ambulatory Visit (INDEPENDENT_AMBULATORY_CARE_PROVIDER_SITE_OTHER): Payer: Medicare Other

## 2022-02-02 DIAGNOSIS — I255 Ischemic cardiomyopathy: Secondary | ICD-10-CM | POA: Diagnosis not present

## 2022-02-02 LAB — CUP PACEART REMOTE DEVICE CHECK
Battery Remaining Longevity: 5 mo
Battery Remaining Percentage: 4 %
Battery Voltage: 2.62 V
Brady Statistic RV Percent Paced: 1 %
Date Time Interrogation Session: 20230914020016
HighPow Impedance: 93 Ohm
HighPow Impedance: 93 Ohm
Implantable Lead Implant Date: 20120621
Implantable Lead Location: 753860
Implantable Pulse Generator Implant Date: 20120621
Lead Channel Impedance Value: 1075 Ohm
Lead Channel Pacing Threshold Amplitude: 2 V
Lead Channel Pacing Threshold Pulse Width: 0.5 ms
Lead Channel Sensing Intrinsic Amplitude: 12 mV
Lead Channel Setting Pacing Amplitude: 3.5 V
Lead Channel Setting Pacing Pulse Width: 0.8 ms
Lead Channel Setting Sensing Sensitivity: 0.5 mV
Pulse Gen Serial Number: 819170

## 2022-02-15 NOTE — Progress Notes (Signed)
Remote ICD transmission.   

## 2022-02-16 ENCOUNTER — Ambulatory Visit: Payer: Self-pay

## 2022-02-16 ENCOUNTER — Ambulatory Visit: Payer: Medicare Other | Admitting: Family Medicine

## 2022-02-16 ENCOUNTER — Ambulatory Visit (INDEPENDENT_AMBULATORY_CARE_PROVIDER_SITE_OTHER): Payer: Medicare Other

## 2022-02-16 VITALS — BP 126/82 | HR 76 | Ht 66.0 in | Wt 196.0 lb

## 2022-02-16 DIAGNOSIS — G8929 Other chronic pain: Secondary | ICD-10-CM

## 2022-02-16 DIAGNOSIS — M25511 Pain in right shoulder: Secondary | ICD-10-CM

## 2022-02-16 NOTE — Patient Instructions (Addendum)
Thank you for coming in today.   Please complete the exercises that the athletic trainer went over with you:  View at [email or web address] using code: [id number]  Check back as needed

## 2022-02-16 NOTE — Progress Notes (Signed)
   I, Peterson Lombard, LAT, ATC acting as a scribe for Lynne Leader, MD.  Subjective:    CC: R shoulder pain  HPI: Pt is a 71 y/o male c/o R shoulder pain. Pt has been seen previously by Dr. Tamala Julian in 2018-19. Today, pt c/o R shoulder pain ongoing for a year to a year in a half, worsening over the last couple months. Pt locates pain to all over the R shoulder w/ radiating pain through the R upper arm. Pt is R-hand dominate.   Neck: no Radiates:  no UE Numbness/tingling: no UE Weakness: no Aggravates: overhead Treatments tried: none  Dx imaging: 06/27/16 R shoulder & C-spine XR  Pertinent review of Systems: No fevers or chills  Relevant historical information: Hypertension.  Diabetes. Heart failure  Objective:    Vitals:   02/16/22 1347  BP: 126/82  Pulse: 76  SpO2: 96%   General: Well Developed, well nourished, and in no acute distress.   MSK: Right shoulder: Normal-appearing Nontender. Normal range of motion.  Some pain with abduction. Intact strength. Positive Hawkins and Neer's test.  Lab and Radiology Results  Diagnostic Limited MSK Ultrasound of: Right shoulder subacromial bursa Biceps tendon is intact but surrounded by hypoechoic fluid within tendon sheath. Subscapularis intact normal-appearing Supraspinatus tendon is intact. Mild subacromial bursitis. Infraspinatus tendons intact normal. AC joint is degenerative appearing. Impression: AC DJD and subacromial bursitis.  Probable joint effusion based on appearance of biceps tendon sheath  X-Berwick images right shoulder obtained today personally and independently interpreted Moderate to mild AC DJD.  No acute fractures.  No severe glenohumeral DJD. Await formal radiology review   Impression and Recommendations:    Assessment and Plan: 71 y.o. male with right shoulder pain thought to be due to rotator cuff tendinopathy and impingement.  He is a good candidate for trial of physical therapy or injection.  After  discussion he would like to proceed with trial of home exercise program taught in clinic today by ATC.  I am less optimistic about this plan but will try.  If not improving would recommend formal physical therapy or injection or both.  Check back especially if not improving.[name]  PDMP not reviewed this encounter. Orders Placed This Encounter  Procedures   [place] LIMITED JOINT SPACE STRUCTURES UP RIGHT(NO LINKED CHARGES)    Order Specific Question:   Reason for Exam (SYMPTOM  OR DIAGNOSIS REQUIRED)    Answer:   right shoulder pain    Order Specific Question:   Preferred imaging location?    Answer:   [facility]   DG Shoulder Right    Standing Status:   Future    Number of Occurrences:   1    Standing Expiration Date:   02/17/2023    Order Specific Question:   Reason for Exam (SYMPTOM  OR DIAGNOSIS REQUIRED)    Answer:   right shoulder pain    Order Specific Question:   Preferred imaging location?    Answer:   [facility]   No orders of the defined types were placed in this encounter.   Discussed warning signs or symptoms. Please see discharge instructions. Patient expresses understanding.   The above documentation has been reviewed and is accurate and complete Lynne Leader, M.D.

## 2022-02-20 NOTE — Progress Notes (Signed)
Right shoulder x-Balboa shows some mild arthritis

## 2022-03-06 ENCOUNTER — Ambulatory Visit (INDEPENDENT_AMBULATORY_CARE_PROVIDER_SITE_OTHER): Payer: Medicare Other

## 2022-03-06 DIAGNOSIS — I255 Ischemic cardiomyopathy: Secondary | ICD-10-CM

## 2022-03-07 LAB — CUP PACEART REMOTE DEVICE CHECK
Battery Remaining Longevity: 5 mo
Battery Remaining Percentage: 4 %
Battery Voltage: 2.62 V
Brady Statistic RV Percent Paced: 1 %
Date Time Interrogation Session: 20231015020016
HighPow Impedance: 92 Ohm
HighPow Impedance: 92 Ohm
Implantable Lead Implant Date: 20120621
Implantable Lead Location: 753860
Implantable Pulse Generator Implant Date: 20120621
Lead Channel Impedance Value: 1025 Ohm
Lead Channel Pacing Threshold Amplitude: 2 V
Lead Channel Pacing Threshold Pulse Width: 0.5 ms
Lead Channel Sensing Intrinsic Amplitude: 12 mV
Lead Channel Setting Pacing Amplitude: 3.5 V
Lead Channel Setting Pacing Pulse Width: 0.8 ms
Lead Channel Setting Sensing Sensitivity: 0.5 mV
Pulse Gen Serial Number: 819170

## 2022-03-08 ENCOUNTER — Ambulatory Visit (HOSPITAL_COMMUNITY): Payer: Medicare Other | Attending: Internal Medicine

## 2022-03-08 DIAGNOSIS — I2510 Atherosclerotic heart disease of native coronary artery without angina pectoris: Secondary | ICD-10-CM | POA: Insufficient documentation

## 2022-03-08 DIAGNOSIS — I5042 Chronic combined systolic (congestive) and diastolic (congestive) heart failure: Secondary | ICD-10-CM | POA: Diagnosis present

## 2022-03-08 DIAGNOSIS — Z95810 Presence of automatic (implantable) cardiac defibrillator: Secondary | ICD-10-CM | POA: Diagnosis not present

## 2022-03-08 LAB — ECHOCARDIOGRAM COMPLETE
Area-P 1/2: 2.68 cm2
S' Lateral: 3.85 cm

## 2022-03-30 NOTE — Progress Notes (Signed)
Remote ICD transmission.   

## 2022-03-30 NOTE — Addendum Note (Signed)
Addended by: Geralyn Flash D on: 03/30/2022 09:38 AM   Modules accepted: Level of Service

## 2022-03-31 ENCOUNTER — Encounter (HOSPITAL_BASED_OUTPATIENT_CLINIC_OR_DEPARTMENT_OTHER): Payer: Self-pay | Admitting: Cardiovascular Disease

## 2022-03-31 ENCOUNTER — Ambulatory Visit (HOSPITAL_BASED_OUTPATIENT_CLINIC_OR_DEPARTMENT_OTHER): Payer: Medicare Other | Admitting: Cardiovascular Disease

## 2022-03-31 VITALS — BP 104/80 | HR 65 | Ht 66.0 in | Wt 185.0 lb

## 2022-03-31 DIAGNOSIS — E7800 Pure hypercholesterolemia, unspecified: Secondary | ICD-10-CM

## 2022-03-31 DIAGNOSIS — E669 Obesity, unspecified: Secondary | ICD-10-CM

## 2022-03-31 DIAGNOSIS — E1169 Type 2 diabetes mellitus with other specified complication: Secondary | ICD-10-CM | POA: Diagnosis not present

## 2022-03-31 DIAGNOSIS — I5042 Chronic combined systolic (congestive) and diastolic (congestive) heart failure: Secondary | ICD-10-CM

## 2022-03-31 DIAGNOSIS — I10 Essential (primary) hypertension: Secondary | ICD-10-CM

## 2022-03-31 DIAGNOSIS — I2510 Atherosclerotic heart disease of native coronary artery without angina pectoris: Secondary | ICD-10-CM

## 2022-03-31 LAB — LIPID PANEL
Chol/HDL Ratio: 2.9 ratio (ref 0.0–5.0)
Cholesterol, Total: 104 mg/dL (ref 100–199)
HDL: 36 mg/dL — ABNORMAL LOW (ref >39)
LDL Chol Calc (NIH): 48 mg/dL (ref 0–99)
Triglycerides: 106 mg/dL (ref 0–149)
VLDL Cholesterol Cal: 20 mg/dL (ref 5–40)

## 2022-03-31 LAB — HEMOGLOBIN A1C
Est. average glucose Bld gHb Est-mCnc: 126 mg/dL
Hgb A1c MFr Bld: 6 % — ABNORMAL HIGH (ref 4.8–5.6)

## 2022-03-31 LAB — COMPREHENSIVE METABOLIC PANEL
ALT: 16 IU/L (ref 0–44)
AST: 18 IU/L (ref 0–40)
Albumin/Globulin Ratio: 1.4 (ref 1.2–2.2)
Albumin: 4.3 g/dL (ref 3.9–4.9)
Alkaline Phosphatase: 83 IU/L (ref 44–121)
BUN/Creatinine Ratio: 17 (ref 10–24)
BUN: 16 mg/dL (ref 8–27)
Bilirubin Total: 0.7 mg/dL (ref 0.0–1.2)
CO2: 23 mmol/L (ref 20–29)
Calcium: 9.5 mg/dL (ref 8.6–10.2)
Chloride: 101 mmol/L (ref 96–106)
Creatinine, Ser: 0.95 mg/dL (ref 0.76–1.27)
Globulin, Total: 3 g/dL (ref 1.5–4.5)
Glucose: 96 mg/dL (ref 70–99)
Potassium: 5.9 mmol/L (ref 3.5–5.2)
Sodium: 139 mmol/L (ref 134–144)
Total Protein: 7.3 g/dL (ref 6.0–8.5)
eGFR: 87 mL/min/{1.73_m2} (ref >59)

## 2022-03-31 MED ORDER — SACUBITRIL-VALSARTAN 24-26 MG PO TABS: 1.0000 | ORAL_TABLET | Freq: Two times a day (BID) | ORAL | 3 refills | Status: DC

## 2022-03-31 MED ORDER — NITROGLYCERIN 0.4 MG SL SUBL: 0.4000 mg | SUBLINGUAL_TABLET | SUBLINGUAL | 2 refills | Status: DC | PRN

## 2022-03-31 MED ORDER — SPIRONOLACTONE 25 MG PO TABS: 12.5000 mg | ORAL_TABLET | Freq: Every day | ORAL | 3 refills | Status: DC

## 2022-03-31 NOTE — Patient Instructions (Signed)
Medication Instructions:  Your physician recommends that you continue on your current medications as directed. Please refer to the Current Medication list given to you today.  *If you need a refill on your cardiac medications before your next appointment, please call your pharmacy*   Lab Work: Your physician recommends that you return for lab work today: Lipid, CMET, Hemoglobin A1C  Your provider has recommended lab work. Please have this collected at [facility] [place]. The lab is open 8:00 am - 4:30 pm. Please avoid 12:00p - 1:00p for lunch hour. You do not need an appointment. Please go to [address] [place], [name] [address]. This is in the Primary Care office on the 3rd floor, let them know you are there for blood work and they will direct you to the lab.  If you have labs (blood work) drawn today and your tests are completely normal, you will receive your results only by: MyChart Message (if you have MyChart) OR A paper copy in the mail If you have any lab test that is abnormal or we need to change your treatment, we will call you to review the results.  Follow-Up: At [facility] you and your health needs are our priority.  As part of our continuing mission to provide you with exceptional heart care, we have created designated Provider Care Teams.  These Care Teams include your primary Cardiologist (physician) and Advanced Practice Providers (APPs -  Physician Assistants and Nurse Practitioners) who all work together to provide you with the care you need, when you need it.  We recommend signing up for the patient portal called "MyChart".  Sign up information is provided on this After Visit Summary.  MyChart is used to connect with patients for Virtual Visits (Telemedicine).  Patients are able to view lab/test results, encounter notes, upcoming appointments, etc.  Non-urgent messages can be sent to your provider as well.   To learn more about  what you can do with MyChart, go to [email or web address].    Your next appointment:   6 month(s)  The format for your next appointment:   In Person  Provider:   Chilton Si, MD    Other Instructions   Important Information About Sugar

## 2022-03-31 NOTE — Assessment & Plan Note (Signed)
Lipids have been well controlled.  His LDL goal is less than 70.  Continue atorvastatin and check fasting lipids and a CMP today.

## 2022-03-31 NOTE — Assessment & Plan Note (Addendum)
Blood pressure is well-controlled.  It was low today but he denies any lightheadedness, dizziness, or falls.  Continue carvedilol, Entresto and Spironolactone.

## 2022-03-31 NOTE — Addendum Note (Signed)
Addended by: Ileene Musa D on: 03/31/2022 02:28 PM   Modules accepted: Orders

## 2022-03-31 NOTE — Assessment & Plan Note (Signed)
Check hemoglobin A1c today.  Continue Farxiga.

## 2022-03-31 NOTE — Assessment & Plan Note (Addendum)
Prior LAD PCI.  He has no ischemic symptoms.  Continue aspirin, carvedilol, and atorvastatin.

## 2022-03-31 NOTE — Progress Notes (Signed)
Cardiology Office Note   Date:  03/31/2022   ID:  Spencer Horn, DOB Nov 30, 1950, MRN [id number]  PCP:  Kaleen Mask, MD  Cardiologist:   Chilton Si, MD  Electrophysiologist: Lewayne Bunting, MD  No chief complaint on file.   History of Present Illness: Spencer Horn is a 71 y.o. male with CAD s/p MI, hypertension, chronic systolic and diastolic heart failure s/p ICD, erectile dysfunction, COPD, and hyperlipidemia who presents for follow up.  Spencer Horn was previously a patient of Dr. [name].  Spencer Horn had an MI in 2004 and 06/2010.  The second MI was treated with DES to the LAD.  Spencer Horn then had LVEF 35% and subsequently underwent implantation of an ICD.  Spencer Horn continues to complain of fatigue.  Blood counts, thyroid function, vitamin D and testosterone levels were all within normal limits.  Spencer Horn has struggled with exertional dyspnea.  His wife notes that Spencer Horn snores loudly but Spencer Horn has Horn persistently unwilling to get a sleep study.  Spencer Horn reports that Spencer Horn would not wear CPAP and therefore does not want to waste money or time on a test.  Spencer Horn was referred for PFTs that showed severe COPD and emphysema.  Entresto was switched back to losartan due to cost and the fact that his symptoms hadn't improved.  Spencer Horn was referred to Dr. [name] and was started on Spiriva.  The Spiriva was not helpful so Spencer Horn stopped using it.  Spencer Horn does use the albuterol rescue inhaler as needed.  Spencer Horn has Horn unable to afford most inhalers.  Spencer Horn reports that they make just a little too much to qualify for patient assistance programs.   Spencer Horn of increasing fatigue and exertional dyspnea.  Spencer Horn was referred for an echo 12/2019 that revealed LVEF 15 to 20%.  There is global hypokinesis with apical akinesis.  LV was moderately dilated.  Spencer Horn had grade 1 diastolic dysfunction and normal right ventricular function.  There is no significant valvular abnormality.  Spencer Horn had a Lexiscan Myoview at the same time that revealed LVEF 30% with  prior apical infarct and no ischemia.  Spencer Horn participated in cardiac rehab. Spencer Horn had an Echo 05/2020 that revealed LVEF 15-20%. Spencer Horn saw [name], NP on 06/2020 and was doing very well.  At his last appointment Spencer Horn was doing well and had started exercising.  Spencer Horn sees Dr. Ladona Ridgel for his device and things have Horn stable.  At the last visit we recommended getting an Echo but Spencer Horn was feeling well and declined. Spencer Horn reported some atypical chest pain but none with exertion. Spencer Horn reported anxiety and I recommended PCP follow up. Spencer Horn saw Dr Ladona Ridgel and got an Echo 02/2022 with LVEF 35% and mild LDH.   Today, Spencer Horn is accompanied by his wife. Spencer Horn says Spencer Horn is doing good most days. Spencer Horn states that Spencer Horn can become very tired some days. Frequently Spencer Horn will become most tired the day or two after doing some more strenuous activities. Spencer Horn feels well during the activity, however. Spencer Horn endorses that Spencer Horn has never truly gotten good sleep. His wife reports that Spencer Horn snores. Spencer Horn does not tend to sleep in the daytime. Spencer Horn is still walking for exercise and feels well with no chest pain during that exertion. They have Horn experiencing some stress due to their daughter living with them through difficulties with her now estranged husband and son. Spencer Horn denies any palpitations, chest pain, shortness of breath, or peripheral edema. No lightheadedness, headaches, syncope, orthopnea, or  PND.   Past Medical History:  Diagnosis Date   Acute on chronic combined systolic and diastolic CHF (congestive heart failure) (HCC) 01/27/2020   Chronic combined systolic and diastolic heart failure (HCC) 01/27/2020   Coronary artery disease    Diabetes mellitus type 2 in obese (HCC) 09/21/2020   Last hemoglobin A1c was 7.0%.  Spencer Horn has made significant diet and exercise changes since that time.  We will start Farxiga 10 mg daily for his heart failure.  When Spencer Horn gets repeat labs in a few months we will also repeat his hemoglobin A1c.   Dyslipidemia    Erectile dysfunction 08/18/2016    Fatigue 12/18/2019   History of acute anterior wall MI    Hyperlipidemia    ICD (implantable cardiac defibrillator) in place June 2012   Ischemic cardiomyopathy    Personal history of MRSA (methicillin resistant Staphylococcus aureus)    Tobacco abuse    Ventricular dysfunction 07/07/2010   Severe left ventricular dysfunction with ejection fraction of 30-35%    Past Surgical History:  Procedure Laterality Date   CARDIAC CATHETERIZATION  07/07/2010   DES to proximal LAD   CARDIAC CATHETERIZATION  2004   EP IMPLANTABLE DEVICE  June 2012     Current Outpatient Medications  Medication Sig Dispense Refill   aspirin 81 MG tablet Take 81 mg by mouth daily.     atorvastatin (LIPITOR) 40 MG tablet Take 1 tablet (40 mg total) by mouth daily. 90 tablet 1   carvedilol (COREG) 3.125 MG tablet Take 1 tablet by mouth twice daily 180 tablet 2   dapagliflozin propanediol (FARXIGA) 10 MG TABS tablet Take 1 tablet (10 mg total) by mouth daily before breakfast. 90 tablet 3   fluticasone (FLONASE) 50 MCG/ACT nasal spray Place 2 sprays into both nostrils daily. 16 g 6   Multiple Vitamin (MULTIVITAMIN) tablet Take 1 tablet by mouth daily.     nitroGLYCERIN (NITROSTAT) 0.4 MG SL tablet Place 1 tablet (0.4 mg total) under the tongue every 5 (five) minutes as needed for chest pain (for chest pain). 25 tablet 2   ondansetron (ZOFRAN ODT) 4 MG disintegrating tablet Take 1 tablet (4 mg total) by mouth every 8 (eight) hours as needed for nausea or vomiting. 3 tablet 0   spironolactone (ALDACTONE) 25 MG tablet Take 0.5 tablets (12.5 mg total) by mouth daily. 45 tablet 3   sacubitril-valsartan (ENTRESTO) 24-26 MG Take 1 tablet by mouth 2 (two) times daily. 180 tablet 3   No current facility-administered medications for this visit.    Allergies:   Patient has no known allergies.    Social History:  The patient  reports that Spencer Horn quit smoking about 11 years ago. His smoking use included cigarettes. Spencer Horn started  smoking about 51 years ago. Spencer Horn has a 40.00 pack-year smoking history. Spencer Horn has never used smokeless tobacco. Spencer Horn reports that Spencer Horn does not drink alcohol and does not use drugs.   Family History:  The patient's family history includes Bladder Cancer in his father; Bone cancer in his mother; Diabetes in his father; Hyperlipidemia in his father; Hypertension in his mother.    ROS:   Please see the history of present illness. (+) Intermittent fatigue  (+) Stress All other systems are reviewed and negative.    PHYSICAL EXAM: VS:  BP 104/80 (BP Location: Left Arm, Patient Position: Sitting, Cuff Size: Normal)   Pulse 65   Ht 5\' 6"  (1.676 m)   Wt 185 lb (83.9 kg)   BMI  29.86 kg/m  , BMI Body mass index is 29.86 kg/m. GENERAL:  Well appearing HEENT: Pupils equal round and reactive, fundi not visualized, oral mucosa unremarkable NECK:  No jugular venous distention, waveform within normal limits, carotid upstroke brisk and symmetric, no bruits, no thyromegaly LUNGS:  Clear to auscultation bilaterally HEART:  RRR.  PMI not displaced or sustained,S1 and S2 within normal limits, no S3, no S4, no clicks, no rubs, no murmurs ABD:  Flat, positive bowel sounds normal in frequency in pitch, no bruits, no rebound, no guarding, no midline pulsatile mass, no hepatomegaly, no splenomegaly EXT:  2 plus pulses throughout, no edema, no cyanosis no clubbing SKIN:  No rashes no nodules NEURO:  Cranial nerves II through XII grossly intact, motor grossly intact throughout PSYCH:  Cognitively intact, oriented to person place and time   EKG:  EKG is personally reviewed. 10/12/15: sinus rhythm rate 68 bpm.  Low voltage limb leads and precordial leads.  Prior anteroseptal infarct.   08/16/16: sinus rhythm.  Rate 76 bpm.  Prior anteroseptal infarct.  Lateral TWI.  04/17/17: Sinus rhythm.  Rate 74 bpm.  Prior septal infarct.   Sinus rhythm.  Rate 66 bpm.  Prior septal infarct.  Anterolateral T wave inversions unchanged  from prior. 12/18/2019: Sinus rhythm.  Rate 78 bpm.  Prior anterior infarct.  Lateral T wave inversions. 03/26/2020: EKG was not ordered. 09/21/2020: EKG is not ordered today. 03/28/2021: Sinus rhythm.  Rate 69 bpm.  LAD.  Prior anteroseptal infarct. 09/29/2021: Sinus arrhythmia.  Rate 71 bpm.  Prior anteroseptal infarct.  Left axis deviation.  Nonspecific T wave abnormalities. 03/31/22: Sinus rhythm. Rate 65 bpm. Left axis deviation. Prior septal infarct.  Other studies reviewed:  Echo 06/14/2020:  1. There has Horn no significant difference since the last study on  01/02/2020, LVEF remains severely decreased at 15-20%. There is akinesis of  all of the apical segments and no evidence for an apical thrombus on  Definity echocontrast images.   2. Left ventricular ejection fraction, by estimation, is <20%. The left  ventricle has severely decreased function. The left ventricle demonstrates  regional wall motion abnormalities (see scoring diagram/findings for  description). Left ventricular  diastolic parameters are consistent with Grade I diastolic dysfunction  (impaired relaxation). Elevated left atrial pressure.   3. Right ventricular systolic function is mildly reduced. The right  ventricular size is normal. There is normal pulmonary artery systolic  pressure. The estimated right ventricular systolic pressure is 22.4 mmHg.   4. The mitral valve is normal in structure. Mild mitral valve  regurgitation. No evidence of mitral stenosis.   5. The aortic valve is normal in structure. Aortic valve regurgitation is  not visualized. No aortic stenosis is present.   6. The inferior vena cava is normal in size with greater than 50%  respiratory variability, suggesting right atrial pressure of 3 mmHg.    Echo 01/02/20: 1. Left ventricular ejection fraction, by estimation, is 15-20%. The left  ventricle has severely decreased function. The left ventricle demonstrates  global hypokinesis with apical  akinesis. The left ventricular internal  cavity size was moderately  dilated. Left ventricular diastolic parameters are consistent with Grade I  diastolic dysfunction (impaired relaxation).   2. Right ventricular systolic function is normal. The right ventricular  size is normal. There is mildly elevated pulmonary artery systolic  pressure.   3. Left atrial size was mildly dilated.   4. The mitral valve is normal in structure. Trivial mitral valve  regurgitation.  5. The aortic valve is normal in structure. Aortic valve regurgitation is  trivial. Mild aortic valve sclerosis is present, with no evidence of  aortic valve stenosis.   6. Aortic dilatation noted. Aneurysm of the aortic root.   7. No evidence of LV thrombus with Definity contrast injection.   Lexiscan Myoview 12/2019: Nuclear stress EF: 30%. There was no ST segment deviation noted during stress. Defect 1: There is a medium defect of severe severity present in the apex location. Findings consistent with prior myocardial infarction. This is a high risk study. The left ventricular ejection fraction is moderately decreased (30-44%).   Abnormal, high risk stress nuclear study with prior apical infarct but no ischemia.  Gated ejection fraction was 30% with global hypokinesis worse at the apex.  Moderate left ventricular enlargement.    Echo 06/18/15: Study Conclusions   - Left ventricle: LVEF is severely depressed at approximately 35 to   40% with severe hypokinesis of the mid/distal lateral, mid/distal   septal, distal inferior, apical wals. The cavity size was normal. - Ventricular septum: The contour showed systolic flattening.     Recent Labs: 09/29/2021: ALT 12; BUN 15; Creatinine, Ser 0.85; Potassium 5.3; Sodium 139    Lipid Panel    Component Value Date/Time   CHOL 120 09/29/2021 0938   TRIG 181 (H) 09/29/2021 0938   HDL 34 (L) 09/29/2021 0938   CHOLHDL 3.5 09/29/2021 0938   CHOLHDL 3.3 04/11/2016 1035   VLDL  24 04/11/2016 1035   LDLCALC 56 09/29/2021 0938      Wt Readings from Last 3 Encounters:  03/31/22 185 lb (83.9 kg)  02/16/22 196 lb (88.9 kg)  10/31/21 191 lb 12.8 oz (87 kg)      ASSESSMENT AND PLAN:  Chronic combined systolic and diastolic heart failure (HCC) LVEF was <20%.  It has Horn better with LVEF 35% on his last echo this year.  Spencer Horn is euvolemic and doing well.  Continue carvedilol, Entresto, spironolactone, and [place].  Check CMP today.  Spencer Horn has an ICD and is followed by Dr. Ladona Ridgel.   Hypertension Blood pressure is well-controlled.  It was low today but Spencer Horn denies any lightheadedness, dizziness, or falls.  Continue carvedilol, Entresto and Spironolactone.   Diabetes mellitus type 2 in obese (HCC) Check hemoglobin A1c today.  Continue Farxiga.  Hyperlipidemia Lipids have Horn well controlled.  His LDL goal is less than 70.  Continue atorvastatin and check fasting lipids and a CMP today.  Coronary artery disease Prior LAD PCI.  Spencer Horn has no ischemic symptoms.  Continue aspirin, carvedilol, and atorvastatin.   Current medicines are reviewed at length with the patient today.  The patient does not have concerns regarding medicines.  The following changes have Horn made:  Labs/ tests ordered today include:   Orders Placed This Encounter  Procedures   Lipid panel   Comprehensive metabolic panel   Hemoglobin A1c     Disposition:  FU with Alaa Mullally C. Duke Salvia, MD, [facility] in 6 months.    I,Breanna Adamick,acting as a scribe for Chilton Si, MD.,have documented all relevant documentation on the behalf of Chilton Si, MD,as directed by  Chilton Si, MD while in the presence of Chilton Si, MD.  I, Crosby Bevan C. Duke Salvia, MD have reviewed all documentation for this visit.  The documentation of the exam, diagnosis, procedures, and orders on 03/31/2022 are all accurate and complete.   Signed, Fritzi Scripter C. Duke Salvia, MD, [facility]  03/31/2022 10:58 AM    [facility]

## 2022-03-31 NOTE — Progress Notes (Signed)
Cardiology Office Note   Date:  03/31/2022   ID:  Spencer Horn, DOB Nov 30, 1950, MRN [id number]  PCP:  Kaleen Mask, MD  Cardiologist:   Chilton Si, MD  Electrophysiologist: Lewayne Bunting, MD  No chief complaint on file.   History of Present Illness: Spencer Horn is a 71 y.o. male with CAD s/p MI, hypertension, chronic systolic and diastolic heart failure s/p ICD, erectile dysfunction, COPD, and hyperlipidemia who presents for follow up.  Spencer Horn was previously a Horn of Dr. [name].  Spencer had an MI in 2004 and 06/2010.  Spencer second MI was treated with DES to Spencer LAD.  Spencer then had LVEF 35% and subsequently underwent implantation of an ICD.  Spencer continues to complain of fatigue.  Blood counts, thyroid function, vitamin D and testosterone levels were all within normal limits.  Spencer Horn.  His Horn notes that Spencer snores loudly but Spencer has been persistently unwilling to get a sleep study.  Spencer reports that Spencer would not wear CPAP and therefore does not want to waste money or time on a test.  Spencer was referred for PFTs that showed severe COPD and emphysema.  Entresto was switched back to losartan due to cost and Spencer fact that his symptoms hadn't improved.  Spencer was referred to Dr. [name] and was started on Spiriva.  Spencer Spiriva was not helpful so Spencer stopped using it.  Spencer does use Spencer albuterol rescue inhaler as needed.  Spencer has been unable to afford most inhalers.  Spencer reports that they make just a little too much to qualify for Horn assistance programs.   Spencer Horn.  Spencer was referred for an echo 12/2019 that revealed LVEF 15 to 20%.  There is global hypokinesis with apical akinesis.  LV was moderately dilated.  Spencer had grade 1 diastolic dysfunction and normal right ventricular function.  There is no significant valvular abnormality.  Spencer had a Lexiscan Myoview at Spencer same time that revealed LVEF 30% with  prior apical infarct and no ischemia.  Spencer participated in cardiac rehab. Spencer had an Echo 05/2020 that revealed LVEF 15-20%. Spencer saw [name], NP on 06/2020 and was doing very well.  At his last appointment Spencer was doing well and had started exercising.  Spencer sees Dr. Ladona Ridgel for his device and things have been stable.  At Spencer last visit we recommended getting an Echo but Spencer was feeling well and declined. Spencer reported some atypical chest pain but none with exertion. Spencer reported anxiety and I recommended PCP follow up. Spencer saw Dr Ladona Ridgel and got an Echo 02/2022 with LVEF 35% and mild LDH.   Spencer Horn, Spencer Horn. Spencer says Spencer is doing good most days. Spencer states that Spencer can become very tired some days. Frequently Spencer will become most tired Spencer day or two after doing some more strenuous activities. Spencer feels well during Spencer activity, however. Spencer endorses that Spencer has never truly gotten good sleep. His Horn reports that Spencer snores. Spencer does not tend to sleep in Spencer daytime. Spencer is still walking for exercise and feels well with no chest pain during that exertion. They have been experiencing some stress due to their daughter living with them through difficulties with her now estranged husband and son. Spencer denies any palpitations, chest pain, shortness of breath, or peripheral edema. No lightheadedness, headaches, syncope, orthopnea, or  PND.   Past Medical History:  Diagnosis Date   Acute on chronic combined systolic and diastolic CHF (congestive heart failure) (HCC) 01/27/2020   Chronic combined systolic and diastolic heart failure (HCC) 01/27/2020   Coronary artery disease    Diabetes mellitus type 2 in obese (HCC) 09/21/2020   Last hemoglobin A1c was 7.0%.  Spencer has made significant diet and exercise changes since that time.  We will start Farxiga 10 mg daily for his heart failure.  When Spencer gets repeat labs in a few months we will also repeat his hemoglobin A1c.   Dyslipidemia    Erectile dysfunction 08/18/2016    Fatigue 12/18/2019   History of acute anterior wall MI    Hyperlipidemia    ICD (implantable cardiac defibrillator) in place June 2012   Ischemic cardiomyopathy    Personal history of MRSA (methicillin resistant Staphylococcus aureus)    Tobacco abuse    Ventricular dysfunction 07/07/2010   Severe left ventricular dysfunction with ejection fraction of 30-35%    Past Surgical History:  Procedure Laterality Date   CARDIAC CATHETERIZATION  07/07/2010   DES to proximal LAD   CARDIAC CATHETERIZATION  2004   EP IMPLANTABLE DEVICE  June 2012     Current Outpatient Medications  Medication Sig Dispense Refill   aspirin 81 MG tablet Take 81 mg by mouth daily.     atorvastatin (LIPITOR) 40 MG tablet Take 1 tablet (40 mg total) by mouth daily. 90 tablet 1   carvedilol (COREG) 3.125 MG tablet Take 1 tablet by mouth twice daily 180 tablet 2   dapagliflozin propanediol (FARXIGA) 10 MG TABS tablet Take 1 tablet (10 mg total) by mouth daily before breakfast. 90 tablet 3   fluticasone (FLONASE) 50 MCG/ACT nasal spray Place 2 sprays into both nostrils daily. 16 g 6   Multiple Vitamin (MULTIVITAMIN) tablet Take 1 tablet by mouth daily.     nitroGLYCERIN (NITROSTAT) 0.4 MG SL tablet Place 1 tablet (0.4 mg total) under Spencer tongue every 5 (five) minutes as needed for chest pain (for chest pain). 25 tablet 2   ondansetron (ZOFRAN ODT) 4 MG disintegrating tablet Take 1 tablet (4 mg total) by mouth every 8 (eight) hours as needed for nausea or vomiting. 3 tablet 0   spironolactone (ALDACTONE) 25 MG tablet Take 0.5 tablets (12.5 mg total) by mouth daily. 45 tablet 3   sacubitril-valsartan (ENTRESTO) 24-26 MG Take 1 tablet by mouth 2 (two) times daily. 180 tablet 3   No current facility-administered medications for this visit.    Allergies:   Horn has no known allergies.    Social History:  Spencer Horn  reports that Spencer quit smoking about 11 years ago. His smoking use included cigarettes. Spencer started  smoking about 51 years ago. Spencer has a 40.00 pack-year smoking history. Spencer has never used smokeless tobacco. Spencer reports that Spencer does not drink alcohol and does not use drugs.   Horn History:  Spencer Horn history includes Bladder Cancer in his father; Bone cancer in his mother; Diabetes in his father; Hyperlipidemia in his father; Hypertension in his mother.    ROS:   Please see Spencer history of present illness. (+) Intermittent fatigue  (+) Stress All other systems are reviewed and negative.    PHYSICAL EXAM: VS:  BP 104/80 (BP Location: Left Arm, Horn Position: Sitting, Cuff Size: Normal)   Pulse 65   Ht 5\' 6"  (1.676 m)   Wt 185 lb (83.9 kg)   BMI  29.86 kg/m  , BMI Body mass index is 29.86 kg/m. GENERAL:  Well appearing HEENT: Pupils equal round and reactive, fundi not visualized, oral mucosa unremarkable NECK:  No jugular venous distention, waveform within normal limits, carotid upstroke brisk and symmetric, no bruits, no thyromegaly LUNGS:  Clear to auscultation bilaterally HEART:  RRR.  PMI not displaced or sustained,S1 and S2 within normal limits, no S3, no S4, no clicks, no rubs, no murmurs ABD:  Flat, positive bowel sounds normal in frequency in pitch, no bruits, no rebound, no guarding, no midline pulsatile mass, no hepatomegaly, no splenomegaly EXT:  2 plus pulses throughout, no edema, no cyanosis no clubbing SKIN:  No rashes no nodules NEURO:  Cranial nerves II through XII grossly intact, motor grossly intact throughout PSYCH:  Cognitively intact, oriented to person place and time   EKG:  EKG is personally reviewed. 10/12/15: sinus rhythm rate 68 bpm.  Low voltage limb leads and precordial leads.  Prior anteroseptal infarct.   08/16/16: sinus rhythm.  Rate 76 bpm.  Prior anteroseptal infarct.  Lateral TWI.  04/17/17: Sinus rhythm.  Rate 74 bpm.  Prior septal infarct.   Sinus rhythm.  Rate 66 bpm.  Prior septal infarct.  Anterolateral T wave inversions unchanged  from prior. 12/18/2019: Sinus rhythm.  Rate 78 bpm.  Prior anterior infarct.  Lateral T wave inversions. 03/26/2020: EKG was not ordered. 09/21/2020: EKG is not ordered Spencer Horn. 03/28/2021: Sinus rhythm.  Rate 69 bpm.  LAD.  Prior anteroseptal infarct. 09/29/2021: Sinus arrhythmia.  Rate 71 bpm.  Prior anteroseptal infarct.  Left axis deviation.  Nonspecific T wave abnormalities. 03/31/22: Sinus rhythm. Rate 65 bpm. Left axis deviation. Prior septal infarct.  Other studies reviewed:  Echo 06/14/2020:  1. There has been no significant difference since Spencer last study on  01/02/2020, LVEF remains severely decreased at 15-20%. There is akinesis of  all of Spencer apical segments and no evidence for an apical thrombus on  Definity echocontrast images.   2. Left ventricular ejection fraction, by estimation, is <20%. Spencer left  ventricle has severely decreased function. Spencer left ventricle demonstrates  regional wall motion abnormalities (see scoring diagram/findings for  description). Left ventricular  diastolic parameters are consistent with Grade I diastolic dysfunction  (impaired relaxation). Elevated left atrial pressure.   3. Right ventricular systolic function is mildly reduced. Spencer right  ventricular size is normal. There is normal pulmonary artery systolic  pressure. Spencer estimated right ventricular systolic pressure is 22.4 mmHg.   4. Spencer mitral valve is normal in structure. Mild mitral valve  regurgitation. No evidence of mitral stenosis.   5. Spencer aortic valve is normal in structure. Aortic valve regurgitation is  not visualized. No aortic stenosis is present.   6. Spencer inferior vena cava is normal in size with greater than 50%  respiratory variability, suggesting right atrial pressure of 3 mmHg.    Echo 01/02/20: 1. Left ventricular ejection fraction, by estimation, is 15-20%. Spencer left  ventricle has severely decreased function. Spencer left ventricle demonstrates  global hypokinesis with apical  akinesis. Spencer left ventricular internal  cavity size was moderately  dilated. Left ventricular diastolic parameters are consistent with Grade I  diastolic dysfunction (impaired relaxation).   2. Right ventricular systolic function is normal. Spencer right ventricular  size is normal. There is mildly elevated pulmonary artery systolic  pressure.   3. Left atrial size was mildly dilated.   4. Spencer mitral valve is normal in structure. Trivial mitral valve  regurgitation.  5. Spencer aortic valve is normal in structure. Aortic valve regurgitation is  trivial. Mild aortic valve sclerosis is present, with no evidence of  aortic valve stenosis.   6. Aortic dilatation noted. Aneurysm of Spencer aortic root.   7. No evidence of LV thrombus with Definity contrast injection.   Lexiscan Myoview 12/2019: Nuclear stress EF: 30%. There was no ST segment deviation noted during stress. Defect 1: There is a medium defect of severe severity present in Spencer apex location. Findings consistent with prior myocardial infarction. This is a high risk study. Spencer left ventricular ejection fraction is moderately decreased (30-44%).   Abnormal, high risk stress nuclear study with prior apical infarct but no ischemia.  Gated ejection fraction was 30% with global hypokinesis worse at Spencer apex.  Moderate left ventricular enlargement.    Echo 06/18/15: Study Conclusions   - Left ventricle: LVEF is severely depressed at approximately 35 to   40% with severe hypokinesis of Spencer mid/distal lateral, mid/distal   septal, distal inferior, apical wals. Spencer cavity size was normal. - Ventricular septum: Spencer contour showed systolic flattening.     Recent Labs: 09/29/2021: ALT 12; BUN 15; Creatinine, Ser 0.85; Potassium 5.3; Sodium 139    Lipid Panel    Component Value Date/Time   CHOL 120 09/29/2021 0938   TRIG 181 (H) 09/29/2021 0938   HDL 34 (L) 09/29/2021 0938   CHOLHDL 3.5 09/29/2021 0938   CHOLHDL 3.3 04/11/2016 1035   VLDL  24 04/11/2016 1035   LDLCALC 56 09/29/2021 0938      Wt Readings from Last 3 Encounters:  03/31/22 185 lb (83.9 kg)  02/16/22 196 lb (88.9 kg)  10/31/21 191 lb 12.8 oz (87 kg)      ASSESSMENT AND PLAN:  Chronic combined systolic and diastolic heart failure (HCC) LVEF was <20%.  It has been better with LVEF 35% on his last echo this year.  Spencer is euvolemic and doing well.  Continue carvedilol, Entresto, spironolactone, and [place].  Check CMP Spencer Horn.  Spencer has an ICD and is followed by Dr. Lovena Le.   Hypertension Blood pressure is well-controlled.  It was low Spencer Horn but Spencer denies any lightheadedness, dizziness, or falls.  Continue carvedilol, Entresto and Spironolactone.   Diabetes mellitus type 2 in obese (HCC) Check hemoglobin A1c Spencer Horn.  Continue Farxiga.  Hyperlipidemia Lipids have been well controlled.  His LDL goal is less than 70.  Continue atorvastatin and check fasting lipids and a CMP Spencer Horn.  Coronary artery disease Prior LAD PCI.  Spencer has no ischemic symptoms.  Continue aspirin, carvedilol, and atorvastatin.   Current medicines are reviewed at length with Spencer Horn Spencer Horn.  Spencer Horn does not have concerns regarding medicines.  Spencer following changes have been made:  Labs/ tests ordered Spencer Horn include:   Orders Placed This Encounter  Procedures   Lipid panel   Comprehensive metabolic panel   Hemoglobin A1c     Disposition:  FU with Tiffany C. Oval Linsey, MD, [facility] in 6 months.    I,Breanna Adamick,acting as a scribe for Skeet Latch, MD.,have documented all relevant documentation on Spencer behalf of Skeet Latch, MD,as directed by  Skeet Latch, MD while in Spencer presence of Skeet Latch, MD.  I, [place] Oval Linsey, MD have reviewed all documentation for this visit.  Spencer documentation of Spencer exam, diagnosis, procedures, and orders on 03/31/2022 are all accurate and complete.   Signed, Tiffany C. Oval Linsey, MD, [facility]  03/31/2022 10:57 AM    [facility]

## 2022-03-31 NOTE — Assessment & Plan Note (Signed)
LVEF was <20%.  It has been better with LVEF 35% on his last echo this year.  He is euvolemic and doing well.  Continue carvedilol, Entresto, spironolactone, and [place].  Check CMP today.  He has an ICD and is followed by Dr. Ladona Ridgel.

## 2022-04-03 ENCOUNTER — Telehealth (HOSPITAL_BASED_OUTPATIENT_CLINIC_OR_DEPARTMENT_OTHER): Payer: Self-pay | Admitting: Cardiovascular Disease

## 2022-04-03 DIAGNOSIS — I5042 Chronic combined systolic (congestive) and diastolic (congestive) heart failure: Secondary | ICD-10-CM

## 2022-04-03 NOTE — Telephone Encounter (Signed)
Spoke with Regis Bill, LPN, results are in her inbasket, she is going to call patient.

## 2022-04-03 NOTE — Telephone Encounter (Signed)
[employer]- alert lab result, K+ 5.9, patient of Dr. Duke Salvia. Will route to Gillian Shields, NP as she is in the office for review.

## 2022-04-03 NOTE — Telephone Encounter (Signed)
New Message::    Crystal calling with Alert Lab results

## 2022-04-03 NOTE — Telephone Encounter (Signed)
Labs previously resulted. Per lab result recommendations:  Potassium level mildly elevated. Recommend stop Spironolactone and repeat BMP Thursday or Friday of this week. . Ensure avoiding potassium supplement, electrolyte drinks.   Normal kidneys, liver. Cholesterol well controlled. A1c of 6 which is at goal of <7.  Alver Sorrow, NP

## 2022-04-04 ENCOUNTER — Telehealth (HOSPITAL_BASED_OUTPATIENT_CLINIC_OR_DEPARTMENT_OTHER): Payer: Self-pay | Admitting: *Deleted

## 2022-04-04 NOTE — Telephone Encounter (Signed)
Spoke with patients wife yesterday who asked that a new Rx for Marcelline Deist be sent to patient assistance  Called [place] and spoke with [facility]. Per [facility] no new Rx needed until 06/2022 Patient should be getting Marcelline Deist in December  Advised wife, verbalized understanding

## 2022-04-06 ENCOUNTER — Telehealth: Payer: Self-pay

## 2022-04-06 ENCOUNTER — Ambulatory Visit (INDEPENDENT_AMBULATORY_CARE_PROVIDER_SITE_OTHER): Payer: Medicare Other

## 2022-04-06 DIAGNOSIS — I255 Ischemic cardiomyopathy: Secondary | ICD-10-CM

## 2022-04-06 LAB — CUP PACEART REMOTE DEVICE CHECK
Battery Remaining Longevity: 4 mo
Battery Remaining Percentage: 3 %
Battery Voltage: 2.62 V
Brady Statistic RV Percent Paced: 1 %
Date Time Interrogation Session: 20231116034629
HighPow Impedance: 95 Ohm
HighPow Impedance: 95 Ohm
Implantable Lead Connection Status: 753985
Implantable Lead Implant Date: 20120621
Implantable Lead Location: 753860
Implantable Pulse Generator Implant Date: 20120621
Lead Channel Impedance Value: 1025 Ohm
Lead Channel Pacing Threshold Amplitude: 2 V
Lead Channel Pacing Threshold Pulse Width: 0.5 ms
Lead Channel Sensing Intrinsic Amplitude: 12 mV
Lead Channel Setting Pacing Amplitude: 3.5 V
Lead Channel Setting Pacing Pulse Width: 0.8 ms
Lead Channel Setting Sensing Sensitivity: 0.5 mV
Pulse Gen Serial Number: 819170

## 2022-04-06 NOTE — Telephone Encounter (Signed)
-----   Message from Elyse Hsu sent at 04/06/2022  4:03 PM EST ----- Can you call him and let him know he doesn't need to schedule that appt just yet? ----- Message ----- From: Burnell Blanks, LPN Sent: [id number]   3:05 PM EST To: Elyse Hsu  No the wife said he was needing follow up to discuss ICD/gen change out ----- Message ----- From: Elyse Hsu Sent: 04/06/2022   2:55 PM EST To: Burnell Blanks, LPN  Is he having a problem with his ICD? ----- Message ----- From: Burnell Blanks, LPN Sent: [id number]/71/2458   1:29 PM EST To: Glynis Smiles afternoon   Patient would like follow up with Dr Ladona Ridgel to discuss ICD   Thanks   [facility]

## 2022-04-06 NOTE — Telephone Encounter (Signed)
Called and spoke to his wife Phil Dopp, advised patient has an estimated 4 months on battery until it reaches ERI. Once device reaches ERI we will then have patient come into clinic to see Dr. Ladona Ridgel about a gen change. Advised we are checking his battery once a month and will call when reaches ERI. Voiced understanding and appreciative of call.

## 2022-04-07 NOTE — Telephone Encounter (Signed)
Repeat labs ordered, patient walked into office to get lab slips

## 2022-04-08 LAB — BASIC METABOLIC PANEL
BUN/Creatinine Ratio: 21 (ref 10–24)
BUN: 17 mg/dL (ref 8–27)
CO2: 24 mmol/L (ref 20–29)
Calcium: 8.9 mg/dL (ref 8.6–10.2)
Chloride: 99 mmol/L (ref 96–106)
Creatinine, Ser: 0.82 mg/dL (ref 0.76–1.27)
Glucose: 94 mg/dL (ref 70–99)
Potassium: 4.5 mmol/L (ref 3.5–5.2)
Sodium: 136 mmol/L (ref 134–144)
eGFR: 95 mL/min/{1.73_m2} (ref >59)

## 2022-04-10 ENCOUNTER — Encounter (HOSPITAL_BASED_OUTPATIENT_CLINIC_OR_DEPARTMENT_OTHER): Payer: Self-pay | Admitting: *Deleted

## 2022-04-10 ENCOUNTER — Other Ambulatory Visit (HOSPITAL_BASED_OUTPATIENT_CLINIC_OR_DEPARTMENT_OTHER): Payer: Self-pay | Admitting: *Deleted

## 2022-04-10 ENCOUNTER — Telehealth (HOSPITAL_BASED_OUTPATIENT_CLINIC_OR_DEPARTMENT_OTHER): Payer: Self-pay | Admitting: *Deleted

## 2022-04-10 MED ORDER — SACUBITRIL-VALSARTAN 24-26 MG PO TABS: 1.0000 | ORAL_TABLET | Freq: Two times a day (BID) | ORAL | 3 refills | Status: DC

## 2022-04-10 NOTE — Telephone Encounter (Signed)
Entresto patient assistance forms waiting to be singed by Dr Duke Salvia

## 2022-04-12 NOTE — Telephone Encounter (Signed)
Forms faxed, confirmation received 11/20

## 2022-04-26 NOTE — Progress Notes (Signed)
Remote ICD transmission.   

## 2022-05-08 ENCOUNTER — Ambulatory Visit (INDEPENDENT_AMBULATORY_CARE_PROVIDER_SITE_OTHER): Payer: Medicare Other

## 2022-05-08 DIAGNOSIS — I255 Ischemic cardiomyopathy: Secondary | ICD-10-CM

## 2022-05-09 LAB — CUP PACEART REMOTE DEVICE CHECK
Battery Remaining Longevity: 4 mo
Battery Remaining Percentage: 3 %
Battery Voltage: 2.6 V
Brady Statistic RV Percent Paced: 1 %
Date Time Interrogation Session: 20231218035753
HighPow Impedance: 96 Ohm
HighPow Impedance: 96 Ohm
Implantable Lead Connection Status: 753985
Implantable Lead Implant Date: 20120621
Implantable Lead Location: 753860
Implantable Pulse Generator Implant Date: 20120621
Lead Channel Impedance Value: 1050 Ohm
Lead Channel Pacing Threshold Amplitude: 2 V
Lead Channel Pacing Threshold Pulse Width: 0.5 ms
Lead Channel Sensing Intrinsic Amplitude: 12 mV
Lead Channel Setting Pacing Amplitude: 3.5 V
Lead Channel Setting Pacing Pulse Width: 0.8 ms
Lead Channel Setting Sensing Sensitivity: 0.5 mV
Pulse Gen Serial Number: 819170

## 2022-05-23 ENCOUNTER — Telehealth: Payer: Self-pay

## 2022-05-23 ENCOUNTER — Encounter: Payer: Self-pay | Admitting: Internal Medicine

## 2022-05-23 NOTE — Telephone Encounter (Signed)
Outreach made to Pt.  Spoke with wife per DPR/request.  Advised ICD had reached ERI on 05/21/22.  Appt made with Dr. Lovena Le to discuss gen change on June 09, 2021.  At this time the vibratory alert is not bothersome.  They will call device clinic if they change their mind and want the alert turned off.  Direct number to device clinic given.

## 2022-05-23 NOTE — Telephone Encounter (Signed)
Alert received from [employer]:  Device alert for RRT reached 12/31

## 2022-05-23 NOTE — Telephone Encounter (Signed)
Attempted to call Pt.  Will send mychart message.

## 2022-05-27 ENCOUNTER — Other Ambulatory Visit: Payer: Self-pay | Admitting: Internal Medicine

## 2022-06-08 ENCOUNTER — Ambulatory Visit: Payer: Medicare Other | Attending: Internal Medicine

## 2022-06-08 DIAGNOSIS — I255 Ischemic cardiomyopathy: Secondary | ICD-10-CM

## 2022-06-08 LAB — CUP PACEART REMOTE DEVICE CHECK
Battery Remaining Longevity: 0 mo
Battery Voltage: 2.59 V
Brady Statistic RV Percent Paced: 1 %
Date Time Interrogation Session: 20240118020857
HighPow Impedance: 95 Ohm
HighPow Impedance: 95 Ohm
Implantable Lead Connection Status: 753985
Implantable Lead Implant Date: 20120621
Implantable Lead Location: 753860
Implantable Pulse Generator Implant Date: 20120621
Lead Channel Impedance Value: 1050 Ohm
Lead Channel Pacing Threshold Amplitude: 2 V
Lead Channel Pacing Threshold Pulse Width: 0.5 ms
Lead Channel Sensing Intrinsic Amplitude: 12 mV
Lead Channel Setting Pacing Amplitude: 3.5 V
Lead Channel Setting Pacing Pulse Width: 0.8 ms
Lead Channel Setting Sensing Sensitivity: 0.5 mV
Pulse Gen Serial Number: 819170

## 2022-06-09 ENCOUNTER — Encounter: Payer: Self-pay | Admitting: Internal Medicine

## 2022-06-09 ENCOUNTER — Ambulatory Visit: Payer: Medicare Other | Attending: Internal Medicine | Admitting: Internal Medicine

## 2022-06-09 VITALS — BP 110/74 | HR 76 | Ht 66.0 in | Wt 187.0 lb

## 2022-06-09 DIAGNOSIS — Z95810 Presence of automatic (implantable) cardiac defibrillator: Secondary | ICD-10-CM

## 2022-06-09 DIAGNOSIS — I5042 Chronic combined systolic (congestive) and diastolic (congestive) heart failure: Secondary | ICD-10-CM | POA: Diagnosis not present

## 2022-06-09 DIAGNOSIS — I2510 Atherosclerotic heart disease of native coronary artery without angina pectoris: Secondary | ICD-10-CM

## 2022-06-09 NOTE — Progress Notes (Signed)
        HPI Spencer Horn returns today for followup. He is a pleasant 71 yo man with a h/o chronic systolic heart failure, EF 30%, HTN and obesity s/p ICD insertion. He has undergone cardiac rehab and lost weight and feels better. He had Covid and his EF decreased. His repeat echo 3 months ago demonstrates an EF of 35%.  He has class 2 symptoms.        Allergies  Allergen Reactions   Spironolactone        Elevated potassium               Current Outpatient Medications  Medication Sig Dispense Refill   aspirin 81 MG tablet Take 81 mg by mouth daily.       atorvastatin (LIPITOR) 40 MG tablet Take 1 tablet by mouth once daily 90 tablet 1   carvedilol (COREG) 3.125 MG tablet Take 1 tablet by mouth twice daily 180 tablet 2   dapagliflozin propanediol (FARXIGA) 10 MG TABS tablet Take 1 tablet (10 mg total) by mouth daily before breakfast. 90 tablet 3   fluticasone (FLONASE) 50 MCG/ACT nasal spray Place 2 sprays into both nostrils daily. 16 g 6   Multiple Vitamin (MULTIVITAMIN) tablet Take 1 tablet by mouth daily.       nitroGLYCERIN (NITROSTAT) 0.4 MG SL tablet Place 1 tablet (0.4 mg total) under the tongue every 5 (five) minutes as needed for chest pain (for chest pain). 25 tablet 2   ondansetron (ZOFRAN ODT) 4 MG disintegrating tablet Take 1 tablet (4 mg total) by mouth every 8 (eight) hours as needed for nausea or vomiting. 3 tablet 0   sacubitril-valsartan (ENTRESTO) 24-26 MG Take 1 tablet by mouth 2 (two) times daily. 180 tablet 3    No current facility-administered medications for this visit.            Past Medical History:  Diagnosis Date   Acute on chronic combined systolic and diastolic CHF (congestive heart failure) (HCC) 01/27/2020   Chronic combined systolic and diastolic heart failure (HCC) 01/27/2020   Coronary artery disease     Diabetes mellitus type 2 in obese (HCC) 09/21/2020    Last hemoglobin A1c was 7.0%.  He has made significant diet and exercise changes since that time.   We will start Farxiga 10 mg daily for his heart failure.  When he gets repeat labs in a few months we will also repeat his hemoglobin A1c.   Dyslipidemia     Erectile dysfunction 08/18/2016   Fatigue 12/18/2019   History of acute anterior wall MI     Hyperlipidemia     ICD (implantable cardiac defibrillator) in place June 2012   Ischemic cardiomyopathy     Personal history of MRSA (methicillin resistant Staphylococcus aureus)     Tobacco abuse     Ventricular dysfunction 07/07/2010    Severe left ventricular dysfunction with ejection fraction of 30-35%      ROS:    All systems reviewed and negative except as noted in the HPI.          Past Surgical History:  Procedure Laterality Date   CARDIAC CATHETERIZATION   07/07/2010    DES to proximal LAD   CARDIAC CATHETERIZATION   2004   EP IMPLANTABLE DEVICE   June 2012             Family History  Problem Relation Age of Onset   Hypertension Mother     Bone cancer   Mother     Hyperlipidemia Father     Bladder Cancer Father     Diabetes Father          Social History         Socioeconomic History   Marital status: Married      Spouse name: Not on file   Number of children: Not on file   Years of education: Not on file   Highest education level: Not on file  Occupational History   Not on file  Tobacco Use   Smoking status: Former      Packs/day: 1.00      Years: 40.00      Total pack years: 40.00      Types: Cigarettes      Start date: 1972      Quit date: 2012      Years since quitting: 12.0   Smokeless tobacco: Never  Vaping Use   Vaping Use: Never used  Substance and Sexual Activity   Alcohol use: No   Drug use: No   Sexual activity: Yes  Other Topics Concern   Not on file  Social History Narrative   Not on file    Social Determinants of Health    Financial Resource Strain: Not on file  Food Insecurity: Not on file  Transportation Needs: Not on file  Physical Activity: Not on file  Stress: Not on  file  Social Connections: Not on file  Intimate Partner Violence: Not on file        BP 110/74   Pulse 76   Ht 5' 6" (1.676 m)   Wt 187 lb (84.8 kg)   SpO2 96%   BMI 30.18 kg/m    Physical Exam:   Well appearing NAD HEENT: Unremarkable Neck:  No JVD, no thyromegally Lymphatics:  No adenopathy Back:  No CVA tenderness Lungs:  Clear with no wheezes HEART:  Regular rate rhythm, no murmurs, no rubs, no clicks Abd:  soft, positive bowel sounds, no organomegally, no rebound, no guarding Ext:  2 plus pulses, no edema, no cyanosis, no clubbing Skin:  No rashes no nodules Neuro:  CN II through XII intact, motor grossly intact     DEVICE  Normal device function.  See PaceArt for details.    Assess/Plan: 1. Chronic systolic heart failure - his symptoms are class 2. He will continue his current meds. 2. ICD - his [employer] Jude ICD is working normally. We will recheck in several months. His RV threshold is up a bit. We will follow. His EF remains 35%, and I would suggest ICD generator change out. 3. Obesity - he has lost 4 more lbs. He is encouraged to lose more weight. 4. CAD -  He is s/p MI, he denies anginal symptoms.      Spencer Aydt,MD 

## 2022-06-09 NOTE — Patient Instructions (Addendum)
Medication Instructions:  Your physician recommends that you continue on your current medications as directed. Please refer to the Current Medication list given to you today.  *If you need a refill on your cardiac medications before your next appointment, please call your pharmacy*  Lab Work: You will have blood work drawn today. CBC and BMET  Testing/Procedures: None ordered.  Follow-Up: See instruction letter  You will have a St Jude ICD Generator change with Dr. Cristopher Peru on 07/10/2022 at 1130 am.  Surgical scrub given to patient at time of clinic visit.

## 2022-06-10 LAB — BASIC METABOLIC PANEL
BUN/Creatinine Ratio: 25 — ABNORMAL HIGH (ref 10–24)
BUN: 22 mg/dL (ref 8–27)
CO2: 22 mmol/L (ref 20–29)
Calcium: 9.1 mg/dL (ref 8.6–10.2)
Chloride: 101 mmol/L (ref 96–106)
Creatinine, Ser: 0.89 mg/dL (ref 0.76–1.27)
Glucose: 91 mg/dL (ref 70–99)
Potassium: 4.2 mmol/L (ref 3.5–5.2)
Sodium: 138 mmol/L (ref 134–144)
eGFR: 93 mL/min/{1.73_m2} (ref >59)

## 2022-06-10 LAB — CBC WITH DIFFERENTIAL/PLATELET
Basophils Absolute: 0 10*3/uL (ref 0.0–0.2)
Basos: 1 %
EOS (ABSOLUTE): 0.4 10*3/uL (ref 0.0–0.4)
Eos: 6 %
Hematocrit: 49.2 % (ref 37.5–51.0)
Hemoglobin: 16.5 g/dL (ref 13.0–17.7)
Immature Grans (Abs): 0 10*3/uL (ref 0.0–0.1)
Immature Granulocytes: 0 %
Lymphocytes Absolute: 2.8 10*3/uL (ref 0.7–3.1)
Lymphs: 39 %
MCH: 28.8 pg (ref 26.6–33.0)
MCHC: 33.5 g/dL (ref 31.5–35.7)
MCV: 86 fL (ref 79–97)
Monocytes Absolute: 0.6 10*3/uL (ref 0.1–0.9)
Monocytes: 9 %
Neutrophils Absolute: 3.2 10*3/uL (ref 1.4–7.0)
Neutrophils: 45 %
Platelets: 181 10*3/uL (ref 150–450)
RBC: 5.72 x10E6/uL (ref 4.14–5.80)
RDW: 12.7 % (ref 11.6–15.4)
WBC: 7.1 10*3/uL (ref 3.4–10.8)

## 2022-06-12 NOTE — Telephone Encounter (Signed)
Patient approved through 04/2023

## 2022-06-14 NOTE — Progress Notes (Signed)
Remote ICD transmission.   

## 2022-06-29 NOTE — Progress Notes (Signed)
Remote ICD transmission.   

## 2022-07-03 ENCOUNTER — Encounter (HOSPITAL_BASED_OUTPATIENT_CLINIC_OR_DEPARTMENT_OTHER): Payer: Self-pay | Admitting: Cardiovascular Disease

## 2022-07-03 NOTE — Telephone Encounter (Signed)
Spoke with [facility], Rx update they faxed was received Spoke with [employer] and that was what it was in reference to [place] wife [employer] representative recommended calling back Wednesday to follow up

## 2022-07-07 NOTE — Pre-Procedure Instructions (Signed)
Spoke with Spencer Horn patient's wife.  Instructed on the following items: Arrival time 0930 Nothing to eat or drink after midnight No meds AM of procedure Responsible person to drive you home and stay with you for 24 hrs Wash with special soap night before and morning of procedure

## 2022-07-10 ENCOUNTER — Ambulatory Visit (HOSPITAL_COMMUNITY)
Admission: RE | Admit: 2022-07-10 | Discharge: 2022-07-10 | Disposition: A | Discharge status: Home / Self Care | Payer: Medicare Other | Attending: Internal Medicine | Admitting: Internal Medicine

## 2022-07-10 ENCOUNTER — Ambulatory Visit: Payer: Medicare Other

## 2022-07-10 ENCOUNTER — Encounter (HOSPITAL_COMMUNITY)
Admission: RE | Disposition: A | Discharge status: Home / Self Care | Payer: Self-pay | Source: Home / Self Care | Attending: Internal Medicine

## 2022-07-10 ENCOUNTER — Other Ambulatory Visit: Payer: Self-pay

## 2022-07-10 DIAGNOSIS — I5022 Chronic systolic (congestive) heart failure: Secondary | ICD-10-CM | POA: Diagnosis not present

## 2022-07-10 DIAGNOSIS — Z6830 Body mass index (BMI) 30.0-30.9, adult: Secondary | ICD-10-CM | POA: Insufficient documentation

## 2022-07-10 DIAGNOSIS — E669 Obesity, unspecified: Secondary | ICD-10-CM | POA: Diagnosis not present

## 2022-07-10 DIAGNOSIS — Z8249 Family history of ischemic heart disease and other diseases of the circulatory system: Secondary | ICD-10-CM | POA: Diagnosis not present

## 2022-07-10 DIAGNOSIS — Z4502 Encounter for adjustment and management of automatic implantable cardiac defibrillator: Secondary | ICD-10-CM | POA: Insufficient documentation

## 2022-07-10 DIAGNOSIS — Z8616 Personal history of COVID-19: Secondary | ICD-10-CM | POA: Insufficient documentation

## 2022-07-10 DIAGNOSIS — I110 Hypertensive heart disease with heart failure: Secondary | ICD-10-CM | POA: Diagnosis present

## 2022-07-10 DIAGNOSIS — I2510 Atherosclerotic heart disease of native coronary artery without angina pectoris: Secondary | ICD-10-CM | POA: Insufficient documentation

## 2022-07-10 DIAGNOSIS — Z87891 Personal history of nicotine dependence: Secondary | ICD-10-CM | POA: Insufficient documentation

## 2022-07-10 DIAGNOSIS — I252 Old myocardial infarction: Secondary | ICD-10-CM | POA: Diagnosis not present

## 2022-07-10 HISTORY — PX: ICD GENERATOR CHANGEOUT: EP1231

## 2022-07-10 LAB — CUP PACEART REMOTE DEVICE CHECK
Battery Remaining Longevity: 0 mo
Battery Voltage: 2.59 V
Brady Statistic RV Percent Paced: 1 %
Date Time Interrogation Session: 20240219022023
HighPow Impedance: 95 Ohm
HighPow Impedance: 95 Ohm
Implantable Lead Connection Status: 753985
Implantable Lead Implant Date: 20120621
Implantable Lead Location: 753860
Implantable Pulse Generator Implant Date: 20120621
Lead Channel Impedance Value: 1075 Ohm
Lead Channel Pacing Threshold Amplitude: 2 V
Lead Channel Pacing Threshold Pulse Width: 0.5 ms
Lead Channel Sensing Intrinsic Amplitude: 12 mV
Lead Channel Setting Pacing Amplitude: 3.5 V
Lead Channel Setting Pacing Pulse Width: 0.8 ms
Lead Channel Setting Sensing Sensitivity: 0.5 mV
Pulse Gen Serial Number: 819170

## 2022-07-10 SURGERY — ICD GENERATOR CHANGEOUT

## 2022-07-10 MED ORDER — ACETAMINOPHEN 325 MG PO TABS: 325.0000 mg | ORAL_TABLET | ORAL | Status: DC | PRN

## 2022-07-10 MED ORDER — ONDANSETRON HCL 4 MG/2ML IJ SOLN: 4.0000 mg | Freq: Four times a day (QID) | INTRAMUSCULAR | Status: DC | PRN

## 2022-07-10 MED ORDER — FENTANYL CITRATE (PF) 100 MCG/2ML IJ SOLN
INTRAMUSCULAR | Status: AC
  Filled 2022-07-10: qty 2

## 2022-07-10 MED ORDER — POVIDONE-IODINE 10 % EX SWAB: 2.0000 | Freq: Once | CUTANEOUS | Status: DC

## 2022-07-10 MED ORDER — CEFAZOLIN SODIUM-DEXTROSE 2-4 GM/100ML-% IV SOLN
INTRAVENOUS | Status: AC
  Filled 2022-07-10: qty 100

## 2022-07-10 MED ORDER — SODIUM CHLORIDE 0.9 % IV SOLN
INTRAVENOUS | Status: AC
  Filled 2022-07-10: qty 2

## 2022-07-10 MED ORDER — FENTANYL CITRATE (PF) 100 MCG/2ML IJ SOLN
INTRAMUSCULAR | Status: DC | PRN
  Administered 2022-07-10: 25 ug via INTRAVENOUS

## 2022-07-10 MED ORDER — CEFAZOLIN SODIUM-DEXTROSE 2-4 GM/100ML-% IV SOLN
2.0000 g | INTRAVENOUS | Status: AC
  Administered 2022-07-10: 2 g via INTRAVENOUS

## 2022-07-10 MED ORDER — LIDOCAINE HCL (PF) 1 % IJ SOLN
INTRAMUSCULAR | Status: AC
  Filled 2022-07-10: qty 60

## 2022-07-10 MED ORDER — SODIUM CHLORIDE 0.9 % IV SOLN: INTRAVENOUS | Status: DC

## 2022-07-10 MED ORDER — LIDOCAINE HCL (PF) 1 % IJ SOLN
INTRAMUSCULAR | Status: DC | PRN
  Administered 2022-07-10: 45 mL

## 2022-07-10 MED ORDER — MIDAZOLAM HCL 5 MG/5ML IJ SOLN
INTRAMUSCULAR | Status: AC
  Filled 2022-07-10: qty 5

## 2022-07-10 MED ORDER — MIDAZOLAM HCL 5 MG/5ML IJ SOLN
INTRAMUSCULAR | Status: DC | PRN
  Administered 2022-07-10: 2 mg via INTRAVENOUS

## 2022-07-10 MED ORDER — SODIUM CHLORIDE 0.9 % IV SOLN
80.0000 mg | INTRAVENOUS | Status: AC
  Administered 2022-07-10: 80 mg

## 2022-07-10 SURGICAL SUPPLY — 6 items
CABLE SURGICAL S-101-97-12 (CABLE) ×1 IMPLANT
ICD GALLANT VR CDVRA500Q (ICD Generator) IMPLANT
PAD DEFIB RADIO PHYSIO CONN (PAD) ×1 IMPLANT
POUCH AIGIS-R ANTIBACT PPM (Mesh General) ×1 IMPLANT
POUCH AIGIS-R ANTIBACT PPM MED (Mesh General) IMPLANT
TRAY PACEMAKER INSERTION (PACKS) ×1 IMPLANT

## 2022-07-10 NOTE — Discharge Instructions (Signed)

## 2022-07-10 NOTE — H&P (Signed)
HPI Spencer Horn returns today for followup. He is a pleasant 72 yo man with a h/o chronic systolic heart failure, EF 30%, HTN and obesity s/p ICD insertion. He has undergone cardiac rehab and lost weight and feels better. He had Covid and his EF decreased. His repeat echo 3 months ago demonstrates an EF of 35%.  He has class 2 symptoms.        Allergies  Allergen Reactions   Spironolactone        Elevated potassium               Current Outpatient Medications  Medication Sig Dispense Refill   aspirin 81 MG tablet Take 81 mg by mouth daily.       atorvastatin (LIPITOR) 40 MG tablet Take 1 tablet by mouth once daily 90 tablet 1   carvedilol (COREG) 3.125 MG tablet Take 1 tablet by mouth twice daily 180 tablet 2   dapagliflozin propanediol (FARXIGA) 10 MG TABS tablet Take 1 tablet (10 mg total) by mouth daily before breakfast. 90 tablet 3   fluticasone (FLONASE) 50 MCG/ACT nasal spray Place 2 sprays into both nostrils daily. 16 g 6   Multiple Vitamin (MULTIVITAMIN) tablet Take 1 tablet by mouth daily.       nitroGLYCERIN (NITROSTAT) 0.4 MG SL tablet Place 1 tablet (0.4 mg total) under the tongue every 5 (five) minutes as needed for chest pain (for chest pain). 25 tablet 2   ondansetron (ZOFRAN ODT) 4 MG disintegrating tablet Take 1 tablet (4 mg total) by mouth every 8 (eight) hours as needed for nausea or vomiting. 3 tablet 0   sacubitril-valsartan (ENTRESTO) 24-26 MG Take 1 tablet by mouth 2 (two) times daily. 180 tablet 3    No current facility-administered medications for this visit.            Past Medical History:  Diagnosis Date   Acute on chronic combined systolic and diastolic CHF (congestive heart failure) ([place]) 01/27/2020   Chronic combined systolic and diastolic heart failure ([place]) 01/27/2020   Coronary artery disease     Diabetes mellitus type 2 in obese ([place]) 09/21/2020    Last hemoglobin A1c was 7.0%.  He has made significant diet and exercise changes since that time.   We will start Farxiga 10 mg daily for his heart failure.  When he gets repeat labs in a few months we will also repeat his hemoglobin A1c.   Dyslipidemia     Erectile dysfunction 08/18/2016   Fatigue 12/18/2019   History of acute anterior wall MI     Hyperlipidemia     ICD (implantable cardiac defibrillator) in place June 2012   Ischemic cardiomyopathy     Personal history of MRSA (methicillin resistant Staphylococcus aureus)     Tobacco abuse     Ventricular dysfunction 07/07/2010    Severe left ventricular dysfunction with ejection fraction of 30-35%      ROS:    All systems reviewed and negative except as noted in the HPI.          Past Surgical History:  Procedure Laterality Date   CARDIAC CATHETERIZATION   07/07/2010    DES to proximal LAD   CARDIAC CATHETERIZATION   2004   EP IMPLANTABLE DEVICE   June 2012             Family History  Problem Relation Age of Onset   Hypertension Mother     Bone cancer  Mother     Hyperlipidemia Father     Bladder Cancer Father     Diabetes Father          Social History         Socioeconomic History   Marital status: Married      Spouse name: Not on file   Number of children: Not on file   Years of education: Not on file   Highest education level: Not on file  Occupational History   Not on file  Tobacco Use   Smoking status: Former      Packs/day: 1.00      Years: 40.00      Total pack years: 40.00      Types: Cigarettes      Start date: 75      Quit date: 2012      Years since quitting: 12.0   Smokeless tobacco: Never  Vaping Use   Vaping Use: Never used  Substance and Sexual Activity   Alcohol use: No   Drug use: No   Sexual activity: Yes  Other Topics Concern   Not on file  Social History Narrative   Not on file    Social Determinants of Health    Financial Resource Strain: Not on file  Food Insecurity: Not on file  Transportation Needs: Not on file  Physical Activity: Not on file  Stress: Not on  file  Social Connections: Not on file  Intimate Partner Violence: Not on file        BP 110/74   Pulse 76   Ht 5' 6"$  (1.676 m)   Wt 187 lb (84.8 kg)   SpO2 96%   BMI 30.18 kg/m    Physical Exam:   Well appearing NAD HEENT: Unremarkable Neck:  No JVD, no thyromegally Lymphatics:  No adenopathy Back:  No CVA tenderness Lungs:  Clear with no wheezes HEART:  Regular rate rhythm, no murmurs, no rubs, no clicks Abd:  soft, positive bowel sounds, no organomegally, no rebound, no guarding Ext:  2 plus pulses, no edema, no cyanosis, no clubbing Skin:  No rashes no nodules Neuro:  CN II through XII intact, motor grossly intact     DEVICE  Normal device function.  See PaceArt for details.    Assess/Plan: 1. Chronic systolic heart failure - his symptoms are class 2. He will continue his current meds. 2. ICD - his [employer] ICD is working normally. We will recheck in several months. His RV threshold is up a bit. We will follow. His EF remains 35%, and I would suggest ICD generator change out. 3. Obesity - he has lost 4 more lbs. He is encouraged to lose more weight. 4. CAD -  He is s/p MI, he denies anginal symptoms.      Spencer Overlie [name],MD

## 2022-07-11 ENCOUNTER — Encounter (HOSPITAL_COMMUNITY): Payer: Self-pay | Admitting: Internal Medicine

## 2022-07-17 ENCOUNTER — Other Ambulatory Visit: Payer: Self-pay | Admitting: Cardiovascular Disease

## 2022-07-17 NOTE — Telephone Encounter (Signed)
Rx(s) sent to pharmacy electronically.  

## 2022-07-20 ENCOUNTER — Ambulatory Visit: Payer: Medicare Other

## 2022-07-21 ENCOUNTER — Ambulatory Visit: Payer: Medicare Other | Attending: Internal Medicine

## 2022-07-21 DIAGNOSIS — I255 Ischemic cardiomyopathy: Secondary | ICD-10-CM | POA: Diagnosis not present

## 2022-07-21 NOTE — Patient Instructions (Signed)
   After Your ICD (Implantable Cardiac Defibrillator)    Monitor your defibrillator site for redness, swelling, and drainage. Call the device clinic at [phone] if you experience these symptoms or fever/chills.  Your incision was closed with Steri-strips or staples:  You may shower 7 days after your procedure and wash your incision with soap and water. Avoid lotions, ointments, or perfumes over your incision until it is well-healed.  You may use a hot tub or a pool after your wound check appointment if the incision is completely closed.   There are no other restrictions in arm movement after your wound check appointment.   Your ICD is designed to protect you from life threatening heart rhythms. Because of this, you may receive a shock.   1 shock with no symptoms:  Call the office during business hours. 1 shock with symptoms (chest pain, chest pressure, dizziness, lightheadedness, shortness of breath, overall feeling unwell):  Call 911. If you experience 2 or more shocks in 24 hours:  Call 911. If you receive a shock, you should not drive.  [place] DMV - no driving for 6 months if you receive appropriate therapy from your ICD.   ICD Alerts:  Some alerts are vibratory and others beep. These are NOT emergencies. Please call our office to let us know. If this occurs at night or on weekends, it can wait until the next business day. Send a remote transmission.  If your device is capable of reading fluid status (for heart failure), you will be offered monthly monitoring to review this with you.   Remote monitoring is used to monitor your ICD from home. This monitoring is scheduled every 91 days by our office. It allows [place] to keep an eye on the functioning of your device to ensure it is working properly. You will routinely see your Electrophysiologist annually (more often if necessary).

## 2022-07-23 LAB — CUP PACEART INCLINIC DEVICE CHECK
Battery Remaining Longevity: 128 mo
Brady Statistic RV Percent Paced: 0 %
Date Time Interrogation Session: 20240301155300
HighPow Impedance: 83.25 Ohm
Implantable Lead Connection Status: 753985
Implantable Lead Implant Date: 20120621
Implantable Lead Location: 753860
Implantable Pulse Generator Implant Date: 20240219
Lead Channel Impedance Value: 1100 Ohm
Lead Channel Pacing Threshold Amplitude: 1.25 V
Lead Channel Pacing Threshold Amplitude: 1.25 V
Lead Channel Pacing Threshold Pulse Width: 0.8 ms
Lead Channel Pacing Threshold Pulse Width: 0.8 ms
Lead Channel Sensing Intrinsic Amplitude: 11.3 mV
Lead Channel Setting Pacing Amplitude: 2.5 V
Lead Channel Setting Pacing Pulse Width: 0.8 ms
Lead Channel Setting Sensing Sensitivity: 0.5 mV
Pulse Gen Serial Number: 211012935

## 2022-07-23 NOTE — Progress Notes (Signed)
Wound check appointment. Steri-strips removed. Wound without redness or edema. Incision edges approximated, wound well healed. Normal device function. Thresholds, sensing, and impedances consistent with implant measurements. Device programmed appropriately for chronic lead.  Histogram distribution appropriate for patient and level of activity. No mode switches or ventricular arrhythmias noted. Patient educated about wound care, there are no further restrictions with arm movement, or lift push pull as this was a gen change only procedure.  ROV in 3 months with implanting physician.

## 2022-08-10 ENCOUNTER — Ambulatory Visit: Payer: Medicare Other

## 2022-09-11 ENCOUNTER — Ambulatory Visit: Payer: Medicare Other

## 2022-10-05 ENCOUNTER — Ambulatory Visit (HOSPITAL_BASED_OUTPATIENT_CLINIC_OR_DEPARTMENT_OTHER): Payer: Medicare Other | Admitting: Cardiovascular Disease

## 2022-10-05 ENCOUNTER — Encounter (HOSPITAL_BASED_OUTPATIENT_CLINIC_OR_DEPARTMENT_OTHER): Payer: Self-pay | Admitting: Cardiovascular Disease

## 2022-10-05 VITALS — BP 108/64 | HR 65 | Ht 66.0 in | Wt 185.2 lb

## 2022-10-05 DIAGNOSIS — I2510 Atherosclerotic heart disease of native coronary artery without angina pectoris: Secondary | ICD-10-CM

## 2022-10-05 DIAGNOSIS — E7800 Pure hypercholesterolemia, unspecified: Secondary | ICD-10-CM | POA: Diagnosis not present

## 2022-10-05 DIAGNOSIS — I5042 Chronic combined systolic (congestive) and diastolic (congestive) heart failure: Secondary | ICD-10-CM | POA: Diagnosis not present

## 2022-10-05 NOTE — Patient Instructions (Signed)
Medication Instructions:  Your physician recommends that you continue on your current medications as directed. Please refer to the Current Medication list given to you today.  *If you need a refill on your cardiac medications before your next appointment, please call your pharmacy*   Lab Work: Your physician recommends that you return for lab work today- Lipid Panel and CMP   Follow-Up: At [facility], you and your health needs are our priority.  As part of our continuing mission to provide you with exceptional heart care, we have created designated Provider Care Teams.  These Care Teams include your primary Cardiologist (physician) and Advanced Practice Providers (APPs -  Physician Assistants and Nurse Practitioners) who all work together to provide you with the care you need, when you need it.  We recommend signing up for the patient portal called "MyChart".  Sign up information is provided on this After Visit Summary.  MyChart is used to connect with patients for Virtual Visits (Telemedicine).  Patients are able to view lab/test results, encounter notes, upcoming appointments, etc.  Non-urgent messages can be sent to your provider as well.   To learn more about what you can do with MyChart, go to [email or web address].    Your next appointment:   6 months with Gillian Shields, NP   &   1 year with Dr. Duke Salvia

## 2022-10-05 NOTE — Progress Notes (Signed)
Cardiology Office Note  Date:  10/05/2022   ID:  NOSSON COVAULT, DOB [birth year], MRN [id number]  PCP:  Kaleen Mask, MD  Cardiologist:   Chilton Si, MD  Electrophysiologist: Lewayne Bunting, MD  CC:  Follow-up  History of Present Illness: Spencer Horn is a 72 y.o. male with CAD s/p MI, hypertension, chronic systolic and diastolic heart failure s/p ICD, erectile dysfunction, COPD, and hyperlipidemia who presents for follow up.  Spencer Horn was previously a patient of Dr. [name].  He had an MI in 2004 and 06/2010.  The second MI was treated with DES to the LAD.  He then had LVEF 35% and subsequently underwent implantation of an ICD.  He continues to complain of fatigue.  Blood counts, thyroid function, vitamin D and testosterone levels were all within normal limits.  Spencer Horn has struggled with exertional dyspnea.  His wife notes that he snores loudly but he has been persistently unwilling to get a sleep study.  He reports that he would not wear CPAP and therefore does not want to waste money or time on a test.  He was referred for PFTs that showed severe COPD and emphysema.  Entresto was switched back to losartan due to cost and the fact that his symptoms hadn't improved.  He was referred to Dr. Marchelle Horn and was started on Spiriva.  The Spiriva was not helpful so he stopped using it.  He does use the albuterol rescue inhaler as needed.  He has been unable to afford most inhalers.  He reports that they make just a little too much to qualify for patient assistance programs.   Spencer Horn complained of increasing fatigue and exertional dyspnea.  He was referred for an echo 12/2019 that revealed LVEF 15 to 20%.  There is global hypokinesis with apical akinesis.  LV was moderately dilated.  He had grade 1 diastolic dysfunction and normal right ventricular function.  There is no significant valvular abnormality.  He had a Lexiscan Myoview at the same time that revealed LVEF 30% with prior apical infarct  and no ischemia.  He participated in cardiac rehab. He had an Echo 05/2020 that revealed LVEF 15-20%. He saw Spencer Fabian, NP on 06/2020 and was doing very well.  He sees Dr. Ladona Ridgel for his ICD which had been stable.  Previously we recommended getting an Echo but he was feeling well and declined. He reported some atypical chest pain but none with exertion. He reported anxiety and I recommended PCP follow up. He saw Dr Ladona Ridgel and completed an Echo 02/2022 with LVEF 35% and mild LDH.   At his visit 03/2022 he was euvolemic on exam and doing well. His blood pressure was low in the office but he was asymptomatic. Labs at that time were notable for K+ of 5.9; it was recommended to stop spironolactone. His potassium levels normalized and he remained off spironolactone. He was seen by Dr. Ladona Ridgel 05/2022 and subsequently underwent successful ICD generator change-out the following month.  Today, he is accompanied by a family member. He is feeling okay overall. Lately, he endorses routine exercise. Usually he feels alright when he is walking. At home his blood pressure has been stable. In the office it is 108/64; he is asymptomatic. He denies any palpitations, chest pain, shortness of breath, or peripheral edema. No lightheadedness, headaches, syncope, orthopnea, or PND.   Past Medical History:  Diagnosis Date   Acute on chronic combined systolic and diastolic CHF (congestive heart failure) (HCC)  01/27/2020   Chronic combined systolic and diastolic heart failure (HCC) 01/27/2020   Coronary artery disease    Diabetes mellitus type 2 in obese 09/21/2020   Last hemoglobin A1c was 7.0%.  He has made significant diet and exercise changes since that time.  We will start Farxiga 10 mg daily for his heart failure.  When he gets repeat labs in a few months we will also repeat his hemoglobin A1c.   Dyslipidemia    Erectile dysfunction 08/18/2016   Fatigue 12/18/2019   History of acute anterior wall MI    Hyperlipidemia     ICD (implantable cardiac defibrillator) in place June 2012   Ischemic cardiomyopathy    Personal history of MRSA (methicillin resistant Staphylococcus aureus)    Tobacco abuse    Ventricular dysfunction 07/07/2010   Severe left ventricular dysfunction with ejection fraction of 30-35%    Past Surgical History:  Procedure Laterality Date   CARDIAC CATHETERIZATION  07/07/2010   DES to proximal LAD   CARDIAC CATHETERIZATION  2004   EP IMPLANTABLE DEVICE  June 2012   ICD GENERATOR CHANGEOUT N/A 07/10/2022   Procedure: ICD GENERATOR CHANGEOUT;  Surgeon: Marinus Maw, MD;  Location: [facility] INVASIVE CV LAB;  Service: Cardiovascular;  Laterality: N/A;     Current Outpatient Medications  Medication Sig Dispense Refill   aspirin 81 MG tablet Take 81 mg by mouth daily.     atorvastatin (LIPITOR) 40 MG tablet Take 1 tablet by mouth once daily 90 tablet 1   carvedilol (COREG) 3.125 MG tablet Take 1 tablet by mouth twice daily 180 tablet 3   dapagliflozin propanediol (FARXIGA) 10 MG TABS tablet Take 1 tablet (10 mg total) by mouth daily before breakfast. 90 tablet 3   fluticasone (FLONASE) 50 MCG/ACT nasal spray Place 2 sprays into both nostrils daily. 16 g 6   Multiple Vitamin (MULTIVITAMIN) tablet Take 1 tablet by mouth daily.     nitroGLYCERIN (NITROSTAT) 0.4 MG SL tablet Place 1 tablet (0.4 mg total) under the tongue every 5 (five) minutes as needed for chest pain (for chest pain). 25 tablet 2   ondansetron (ZOFRAN ODT) 4 MG disintegrating tablet Take 1 tablet (4 mg total) by mouth every 8 (eight) hours as needed for nausea or vomiting. 3 tablet 0   sacubitril-valsartan (ENTRESTO) 24-26 MG Take 1 tablet by mouth 2 (two) times daily. 180 tablet 3   No current facility-administered medications for this visit.    Allergies:   Spironolactone    Social History:  The patient  reports that he quit smoking about 12 years ago. His smoking use included cigarettes. He started smoking about 52 years ago.  He has a 40.00 pack-year smoking history. He has never used smokeless tobacco. He reports that he does not drink alcohol and does not use drugs.   Family History:  The patient's family history includes Bladder Cancer in his father; Bone cancer in his mother; Diabetes in his father; Hyperlipidemia in his father; Hypertension in his mother.    ROS:   Please see the history of present illness. All other systems are reviewed and negative.    PHYSICAL EXAM: VS:  BP 108/64 (BP Location: Left Arm, Patient Position: Sitting, Cuff Size: Normal)   Pulse 65   Ht 5\' 6"  (1.676 m)   Wt 185 lb 3.2 oz (84 kg)   BMI 29.89 kg/m  , BMI Body mass index is 29.89 kg/m. GENERAL:  Well appearing HEENT: Pupils equal round and reactive, fundi  not visualized, oral mucosa unremarkable NECK:  No jugular venous distention, waveform within normal limits, carotid upstroke brisk and symmetric, no bruits, no thyromegaly LUNGS:  Clear to auscultation bilaterally HEART:  RRR.  PMI not displaced or sustained,S1 and S2 within normal limits, no S3, no S4, no clicks, no rubs, no murmurs. ICD pocket well healed. ABD:  Flat, positive bowel sounds normal in frequency in pitch, no bruits, no rebound, no guarding, no midline pulsatile mass, no hepatomegaly, no splenomegaly EXT:  2 plus pulses throughout, no edema, no cyanosis no clubbing SKIN:  No rashes no nodules NEURO:  Cranial nerves II through XII grossly intact, motor grossly intact throughout PSYCH:  Cognitively intact, oriented to person place and time   EKG:  EKG is personally reviewed. 10/05/2022:  EKG was not ordered. 03/31/22: Sinus rhythm. Rate 65 bpm. Left axis deviation. Prior septal infarct. 09/29/2021: Sinus arrhythmia.  Rate 71 bpm.  Prior anteroseptal infarct.  Left axis deviation.  Nonspecific T wave abnormalities. 03/28/2021: Sinus rhythm.  Rate 69 bpm.  LAD.  Prior anteroseptal infarct. 09/21/2020: EKG was not ordered. 03/26/2020: EKG was not  ordered. 12/18/2019: Sinus rhythm.  Rate 78 bpm.  Prior anterior infarct.  Lateral T wave inversions. 04/17/17: Sinus rhythm.  Rate 74 bpm.  Prior septal infarct.   Sinus rhythm.  Rate 66 bpm.  Prior septal infarct.  Anterolateral T wave inversions unchanged from prior. 08/16/16: sinus rhythm.  Rate 76 bpm.  Prior anteroseptal infarct.  Lateral TWI.  10/12/15: sinus rhythm rate 68 bpm.  Low voltage limb leads and precordial leads.  Prior anteroseptal infarct.     Other studies reviewed:  ICD Generator Change-out  07/10/2022: Conclusion: Successful removal of her previous implanted single-chamber ICD which had reached elective replacement, and insertion of a new single-chamber ICD in a patient with chronic systolic heart failure and prior ICD insertion with the current device at elective replacement.   Echo  03/08/2022: Sonographer Comments: Technically difficult study due to poor echo  windows.   IMPRESSIONS   1. LV function is depressed with hypokinesis of the base/mid inferior  wall, base mid lateral wall, basal septal wall; akinesis of the mid/distal  septal, distal inferior and apical walls Only anterior wall moves fairly  well. . Left ventricular ejection  fraction, by estimation, is 35%. The left ventricle has moderately  decreased function. There is mild left ventricular hypertrophy.   2. Right ventricular systolic function is normal. The right ventricular  size is normal.   3. The mitral valve is normal in structure. Mild mitral valve  regurgitation.   4. The aortic valve is tricuspid. Aortic valve regurgitation is not  visualized.   5. The inferior vena cava is normal in size with <50% respiratory  variability, suggesting right atrial pressure of 8 mmHg.   Echo 06/14/2020:  1. There has been no significant difference since the last study on  01/02/2020, LVEF remains severely decreased at 15-20%. There is akinesis of  all of the apical segments and no evidence for an apical  thrombus on  Definity echocontrast images.   2. Left ventricular ejection fraction, by estimation, is <20%. The left  ventricle has severely decreased function. The left ventricle demonstrates  regional wall motion abnormalities (see scoring diagram/findings for  description). Left ventricular  diastolic parameters are consistent with Grade I diastolic dysfunction  (impaired relaxation). Elevated left atrial pressure.   3. Right ventricular systolic function is mildly reduced. The right  ventricular size is normal. There is normal  pulmonary artery systolic  pressure. The estimated right ventricular systolic pressure is 22.4 mmHg.   4. The mitral valve is normal in structure. Mild mitral valve  regurgitation. No evidence of mitral stenosis.   5. The aortic valve is normal in structure. Aortic valve regurgitation is  not visualized. No aortic stenosis is present.   6. The inferior vena cava is normal in size with greater than 50%  respiratory variability, suggesting right atrial pressure of 3 mmHg.    Echo 01/02/20: 1. Left ventricular ejection fraction, by estimation, is 15-20%. The left  ventricle has severely decreased function. The left ventricle demonstrates  global hypokinesis with apical akinesis. The left ventricular internal  cavity size was moderately  dilated. Left ventricular diastolic parameters are consistent with Grade I  diastolic dysfunction (impaired relaxation).   2. Right ventricular systolic function is normal. The right ventricular  size is normal. There is mildly elevated pulmonary artery systolic  pressure.   3. Left atrial size was mildly dilated.   4. The mitral valve is normal in structure. Trivial mitral valve  regurgitation.   5. The aortic valve is normal in structure. Aortic valve regurgitation is  trivial. Mild aortic valve sclerosis is present, with no evidence of  aortic valve stenosis.   6. Aortic dilatation noted. Aneurysm of the aortic root.   7.  No evidence of LV thrombus with Definity contrast injection.   Lexiscan Myoview 12/2019: Nuclear stress EF: 30%. There was no ST segment deviation noted during stress. Defect 1: There is a medium defect of severe severity present in the apex location. Findings consistent with prior myocardial infarction. This is a high risk study. The left ventricular ejection fraction is moderately decreased (30-44%).   Abnormal, high risk stress nuclear study with prior apical infarct but no ischemia.  Gated ejection fraction was 30% with global hypokinesis worse at the apex.  Moderate left ventricular enlargement.    Echo 06/18/15: Study Conclusions   - Left ventricle: LVEF is severely depressed at approximately 35 to   40% with severe hypokinesis of the mid/distal lateral, mid/distal   septal, distal inferior, apical wals. The cavity size was normal. - Ventricular septum: The contour showed systolic flattening.     Recent Labs: 03/31/2022: ALT 16 06/09/2022: BUN 22; Creatinine, Ser 0.89; Hemoglobin 16.5; Platelets 181; Potassium 4.2; Sodium 138    Lipid Panel    Component Value Date/Time   CHOL 104 03/31/2022 1038   TRIG 106 03/31/2022 1038   HDL 36 (L) 03/31/2022 1038   CHOLHDL 2.9 03/31/2022 1038   CHOLHDL 3.3 04/11/2016 1035   VLDL 24 04/11/2016 1035   LDLCALC 48 03/31/2022 1038      Wt Readings from Last 3 Encounters:  10/05/22 185 lb 3.2 oz (84 kg)  07/10/22 186 lb 3.2 oz (84.5 kg)  06/09/22 187 lb (84.8 kg)      ASSESSMENT AND PLAN:  Chronic combined systolic and diastolic heart failure (HCC) LVEF 35%.  He is euvolemic and doing well.  Blood pressures are well-controlled.  ICD generator was recently changed out.  Continue carvedilol, Marcelline Deist, and [place].  Coronary artery disease He has no ischemia.  Encouraged to work on increasing exercise.  Continue aspirin, atorvastatin, and carvedilol.  He will check fasting lipids and CMP today.  Implantable  cardioverter-defibrillator (ICD) in situ ICD generator was recently changed out with Dr. Ladona Ridgel.  He is recovering well.   Disposition:   FU with APP in 6 months. FU with Babbette Dalesandro C. Duke Salvia,  MD, [facility] in 1 year.    Medication Adjustments/Labs and Tests Ordered: Current medicines are reviewed at length with the patient today.  Concerns regarding medicines are outlined above.   Orders Placed This Encounter  Procedures   Comprehensive metabolic panel   Lipid panel   No orders of the defined types were placed in this encounter.  Patient Instructions  Medication Instructions:  Your physician recommends that you continue on your current medications as directed. Please refer to the Current Medication list given to you today.  *If you need a refill on your cardiac medications before your next appointment, please call your pharmacy*   Lab Work: Your physician recommends that you return for lab work today- Lipid Panel and CMP   Follow-Up: At [facility], you and your health needs are our priority.  As part of our continuing mission to provide you with exceptional heart care, we have created designated Provider Care Teams.  These Care Teams include your primary Cardiologist (physician) and Advanced Practice Providers (APPs -  Physician Assistants and Nurse Practitioners) who all work together to provide you with the care you need, when you need it.  We recommend signing up for the patient portal called "MyChart".  Sign up information is provided on this After Visit Summary.  MyChart is used to connect with patients for Virtual Visits (Telemedicine).  Patients are able to view lab/test results, encounter notes, upcoming appointments, etc.  Non-urgent messages can be sent to your provider as well.   To learn more about what you can do with MyChart, go to [email or web address].    Your next appointment:   6 months with Gillian Shields, NP   &   1 year with Dr. Prescilla Sours Stumpf,acting as a scribe for Chilton Si, MD.,have documented all relevant documentation on the behalf of Chilton Si, MD,as directed by  Chilton Si, MD while in the presence of Chilton Si, MD.  I, Dontae Minerva C. Duke Salvia, MD have reviewed all documentation for this visit.  The documentation of the exam, diagnosis, procedures, and orders on 10/05/2022 are all accurate and complete.  Signed, Francia Verry C. Duke Salvia, MD, [facility]  10/05/2022 1:13 PM    [facility]

## 2022-10-05 NOTE — Assessment & Plan Note (Signed)
LVEF 35%.  He is euvolemic and doing well.  Blood pressures are well-controlled.  ICD generator was recently changed out.  Continue carvedilol, Spencer Horn, and Spencer Horn.

## 2022-10-05 NOTE — Assessment & Plan Note (Signed)
ICD generator was recently changed out with Dr. Ladona Ridgel.  He is recovering well.

## 2022-10-05 NOTE — Assessment & Plan Note (Signed)
He has no ischemia.  Encouraged to work on increasing exercise.  Continue aspirin, atorvastatin, and carvedilol.  He will check fasting lipids and CMP today.

## 2022-10-06 LAB — COMPREHENSIVE METABOLIC PANEL
ALT: 12 IU/L (ref 0–44)
AST: 17 IU/L (ref 0–40)
Albumin/Globulin Ratio: 1.5 (ref 1.2–2.2)
Albumin: 4.1 g/dL (ref 3.9–4.9)
Alkaline Phosphatase: 86 IU/L (ref 44–121)
BUN/Creatinine Ratio: 21 (ref 10–24)
BUN: 18 mg/dL (ref 8–27)
Bilirubin Total: 0.6 mg/dL (ref 0.0–1.2)
CO2: 25 mmol/L (ref 20–29)
Calcium: 9.2 mg/dL (ref 8.6–10.2)
Chloride: 103 mmol/L (ref 96–106)
Creatinine, Ser: 0.85 mg/dL (ref 0.76–1.27)
Globulin, Total: 2.8 g/dL (ref 1.5–4.5)
Glucose: 90 mg/dL (ref 70–99)
Potassium: 5.2 mmol/L (ref 3.5–5.2)
Sodium: 140 mmol/L (ref 134–144)
Total Protein: 6.9 g/dL (ref 6.0–8.5)
eGFR: 93 mL/min/{1.73_m2} (ref >59)

## 2022-10-06 LAB — LIPID PANEL
Chol/HDL Ratio: 2.6 ratio (ref 0.0–5.0)
Cholesterol, Total: 108 mg/dL (ref 100–199)
HDL: 42 mg/dL (ref >39)
LDL Chol Calc (NIH): 49 mg/dL (ref 0–99)
Triglycerides: 86 mg/dL (ref 0–149)
VLDL Cholesterol Cal: 17 mg/dL (ref 5–40)

## 2022-10-11 ENCOUNTER — Ambulatory Visit (INDEPENDENT_AMBULATORY_CARE_PROVIDER_SITE_OTHER): Payer: Medicare Other

## 2022-10-11 DIAGNOSIS — I5042 Chronic combined systolic (congestive) and diastolic (congestive) heart failure: Secondary | ICD-10-CM

## 2022-10-11 DIAGNOSIS — I255 Ischemic cardiomyopathy: Secondary | ICD-10-CM | POA: Diagnosis not present

## 2022-10-12 ENCOUNTER — Ambulatory Visit: Payer: Medicare Other | Attending: Internal Medicine | Admitting: Internal Medicine

## 2022-10-12 ENCOUNTER — Encounter: Payer: Self-pay | Admitting: Internal Medicine

## 2022-10-12 VITALS — BP 108/68 | HR 72 | Ht 66.0 in | Wt 186.0 lb

## 2022-10-12 DIAGNOSIS — I2510 Atherosclerotic heart disease of native coronary artery without angina pectoris: Secondary | ICD-10-CM

## 2022-10-12 DIAGNOSIS — I5042 Chronic combined systolic (congestive) and diastolic (congestive) heart failure: Secondary | ICD-10-CM | POA: Diagnosis not present

## 2022-10-12 DIAGNOSIS — Z95810 Presence of automatic (implantable) cardiac defibrillator: Secondary | ICD-10-CM

## 2022-10-12 LAB — CUP PACEART INCLINIC DEVICE CHECK
Battery Remaining Longevity: 126 mo
Brady Statistic RV Percent Paced: 0 %
Date Time Interrogation Session: 20240523150130
HighPow Impedance: 92.25 Ohm
Implantable Lead Connection Status: 753985
Implantable Lead Implant Date: 20120621
Implantable Lead Location: 753860
Implantable Pulse Generator Implant Date: 20240219
Lead Channel Impedance Value: 1087.5 Ohm
Lead Channel Pacing Threshold Amplitude: 2 V
Lead Channel Pacing Threshold Amplitude: 2 V
Lead Channel Pacing Threshold Pulse Width: 0.8 ms
Lead Channel Pacing Threshold Pulse Width: 0.8 ms
Lead Channel Sensing Intrinsic Amplitude: 11.3 mV
Lead Channel Setting Pacing Amplitude: 3 V
Lead Channel Setting Pacing Pulse Width: 0.8 ms
Lead Channel Setting Sensing Sensitivity: 0.5 mV
Pulse Gen Serial Number: 211012935

## 2022-10-12 NOTE — Progress Notes (Signed)
HPI  [name] returns today for followup. He is a pleasant 72 yo man with a h/o chronic systolic heart failure, EF 30%, HTN and obesity s/p ICD insertion. He has undergone cardiac rehab and lost weight and feels better. He had Covid and his EF decreased. His repeat echo 3 months ago demonstrates an EF of 35%.  He has class 2 symptoms.      Current Outpatient Medications  Medication Sig Dispense Refill   aspirin 81 MG tablet Take 81 mg by mouth daily.     atorvastatin (LIPITOR) 40 MG tablet Take 1 tablet by mouth once daily 90 tablet 1   carvedilol (COREG) 3.125 MG tablet Take 1 tablet by mouth twice daily 180 tablet 3   dapagliflozin propanediol (FARXIGA) 10 MG TABS tablet Take 1 tablet (10 mg total) by mouth daily before breakfast. 90 tablet 3   fluticasone (FLONASE) 50 MCG/ACT nasal spray Place 2 sprays into both nostrils daily. 16 g 6   Multiple Vitamin (MULTIVITAMIN) tablet Take 1 tablet by mouth daily.     nitroGLYCERIN (NITROSTAT) 0.4 MG SL tablet Place 1 tablet (0.4 mg total) under the tongue every 5 (five) minutes as needed for chest pain (for chest pain). 25 tablet 2   ondansetron (ZOFRAN ODT) 4 MG disintegrating tablet Take 1 tablet (4 mg total) by mouth every 8 (eight) hours as needed for nausea or vomiting. 3 tablet 0   sacubitril-valsartan (ENTRESTO) 24-26 MG Take 1 tablet by mouth 2 (two) times daily. 180 tablet 3   No current facility-administered medications for this visit.     Past Medical History:  Diagnosis Date   Acute on chronic combined systolic and diastolic CHF (congestive heart failure) (HCC) 01/27/2020   Chronic combined systolic and diastolic heart failure (HCC) 01/27/2020   Coronary artery disease    Diabetes mellitus type 2 in obese 09/21/2020   Last hemoglobin A1c was 7.0%.  He has made significant diet and exercise changes since that time.  We will start Farxiga 10 mg daily for his heart failure.  When he gets repeat labs in a few months we will also  repeat his hemoglobin A1c.   Dyslipidemia    Erectile dysfunction 08/18/2016   Fatigue 12/18/2019   History of acute anterior wall MI    Hyperlipidemia    ICD (implantable cardiac defibrillator) in place June 2012   Ischemic cardiomyopathy    Personal history of MRSA (methicillin resistant Staphylococcus aureus)    Tobacco abuse    Ventricular dysfunction 07/07/2010   Severe left ventricular dysfunction with ejection fraction of 30-35%    ROS:   All systems reviewed and negative except as noted in the HPI.   Past Surgical History:  Procedure Laterality Date   CARDIAC CATHETERIZATION  07/07/2010   DES to proximal LAD   CARDIAC CATHETERIZATION  2004   EP IMPLANTABLE DEVICE  June 2012   ICD GENERATOR CHANGEOUT N/A 07/10/2022   Procedure: ICD GENERATOR CHANGEOUT;  Surgeon: Marinus Maw, MD;  Location[facility] INVASIVE CV LAB;  Service: Cardiovascular;  Laterality: N/A;     Family History  Problem Relation Age of Onset   Hypertension Mother    Bone cancer Mother    Hyperlipidemia Father    Bladder Cancer Father    Diabetes Father      Social History   Socioeconomic History   Marital status: Married    Spouse name: Not on file   Number of children: Not on file  Years of education: Not on file   Highest education level: Not on file  Occupational History   Not on file  Tobacco Use   Smoking status: Former    Packs/day: 1.00    Years: 40.00    Additional pack years: 0.00    Total pack years: 40.00    Types: Cigarettes    Start date: 23    Quit date: 2012    Years since quitting: 12.4   Smokeless tobacco: Never  Vaping Use   Vaping Use: Never used  Substance and Sexual Activity   Alcohol use: No   Drug use: No   Sexual activity: Yes  Other Topics Concern   Not on file  Social History Narrative   Not on file   Social Determinants of Health   Financial Resource Strain: Not on file  Food Insecurity: Not on file  Transportation Needs: Not on file  Physical  Activity: Not on file  Stress: Not on file  Social Connections: Not on file  Intimate Partner Violence: Not on file     BP 108/68   Pulse 72   Ht 5\' 6"  (1.676 m)   Wt 186 lb (84.4 kg)   SpO2 96%   BMI 30.02 kg/m   Physical Exam:  Well appearing NAD HEENT: Unremarkable Neck:  No JVD, no thyromegally Lymphatics:  No adenopathy Back:  No CVA tenderness Lungs:  Clear HEART:  Regular rate rhythm, no murmurs, no rubs, no clicks Abd:  soft, positive bowel sounds, no organomegally, no rebound, no guarding Ext:  2 plus pulses, no edema, no cyanosis, no clubbing Skin:  No rashes no nodules Neuro:  CN II through XII intact, motor grossly intact  EKG  DEVICE  Normal device function.  See PaceArt for details.   Assess/Plan:  1. Chronic systolic heart failure - his symptoms are class 2. He will continue his current meds. 2. ICD - his [employer] Jude ICD is working normally. We will recheck in several months.  3. Obesity - he has lost 4 more lbs. He is encouraged to lose more weight. 4. CAD -  He is s/p MI, he denies anginal symptoms.      Spencer Gowda Sylva Overley,MD

## 2022-10-12 NOTE — Patient Instructions (Signed)
Medication Instructions:  Your physician recommends that you continue on your current medications as directed. Please refer to the Current Medication list given to you today.  *If you need a refill on your cardiac medications before your next appointment, please call your pharmacy*  Lab Work: None ordered.  If you have labs (blood work) drawn today and your tests are completely normal, you will receive your results only by: MyChart Message (if you have MyChart) OR A paper copy in the mail If you have any lab test that is abnormal or we need to change your treatment, we will call you to review the results.  Testing/Procedures: None ordered.  Follow-Up: At [facility], you and your health needs are our priority.  As part of our continuing mission to provide you with exceptional heart care, we have created designated Provider Care Teams.  These Care Teams include your primary Cardiologist (physician) and Advanced Practice Providers (APPs -  Physician Assistants and Nurse Practitioners) who all work together to provide you with the care you need, when you need it.  We recommend signing up for the patient portal called "MyChart".  Sign up information is provided on this After Visit Summary.  MyChart is used to connect with patients for Virtual Visits (Telemedicine).  Patients are able to view lab/test results, encounter notes, upcoming appointments, etc.  Non-urgent messages can be sent to your provider as well.   To learn more about what you can do with MyChart, go to [email or web address].    Your next appointment:   1 year(s)  The format for your next appointment:   In Person  Provider:   Lewayne Bunting, MD{or one of the following Advanced Practice Providers on your designated Care Team:   Francis Dowse, [place] Casimiro Needle "Mardelle Matte" Lanna Poche, [place]  Remote monitoring is used to monitor your ICD from home. This monitoring reduces the number of office visits required to check your device to one  time per year. It allows [place] to keep an eye on the functioning of your device to ensure it is working properly. You are scheduled for a device check from home on 8/21. You may send your transmission at any time that day. If you have a wireless device, the transmission will be sent automatically. After your physician reviews your transmission, you will receive a postcard with your next transmission date.

## 2022-10-13 LAB — CUP PACEART REMOTE DEVICE CHECK
Battery Remaining Longevity: 119 mo
Battery Remaining Percentage: 95.5 %
Battery Voltage: 3.1 V
Brady Statistic RV Percent Paced: 0 %
Date Time Interrogation Session: 20240523145627
HighPow Impedance: 92 Ohm
Implantable Lead Connection Status: 753985
Implantable Lead Implant Date: 20120621
Implantable Lead Location: 753860
Implantable Pulse Generator Implant Date: 20240219
Lead Channel Impedance Value: 1100 Ohm
Lead Channel Pacing Threshold Amplitude: 2 V
Lead Channel Pacing Threshold Pulse Width: 0.8 ms
Lead Channel Sensing Intrinsic Amplitude: 11.3 mV
Lead Channel Setting Pacing Amplitude: 3 V
Lead Channel Setting Pacing Pulse Width: 0.8 ms
Lead Channel Setting Sensing Sensitivity: 0.5 mV
Pulse Gen Serial Number: 211012935

## 2022-11-02 NOTE — Progress Notes (Signed)
Remote ICD transmission.   

## 2022-11-28 ENCOUNTER — Other Ambulatory Visit: Payer: Self-pay | Admitting: Internal Medicine

## 2023-01-10 ENCOUNTER — Ambulatory Visit: Payer: Medicare Other

## 2023-01-10 DIAGNOSIS — I255 Ischemic cardiomyopathy: Secondary | ICD-10-CM | POA: Diagnosis not present

## 2023-01-10 LAB — CUP PACEART REMOTE DEVICE CHECK
Battery Remaining Longevity: 117 mo
Battery Remaining Percentage: 94 %
Battery Voltage: 3.05 V
Brady Statistic RV Percent Paced: 1 %
Date Time Interrogation Session: 20240821020437
HighPow Impedance: 96 Ohm
Implantable Lead Connection Status: 753985
Implantable Lead Implant Date: 20120621
Implantable Lead Location: 753860
Implantable Pulse Generator Implant Date: 20240219
Lead Channel Impedance Value: 1150 Ohm
Lead Channel Pacing Threshold Amplitude: 2 V
Lead Channel Pacing Threshold Pulse Width: 0.8 ms
Lead Channel Sensing Intrinsic Amplitude: 11.3 mV
Lead Channel Setting Pacing Amplitude: 3 V
Lead Channel Setting Pacing Pulse Width: 0.8 ms
Lead Channel Setting Sensing Sensitivity: 0.5 mV
Pulse Gen Serial Number: 211012935

## 2023-01-24 NOTE — Progress Notes (Signed)
Remote ICD transmission.   

## 2023-02-01 ENCOUNTER — Other Ambulatory Visit (HOSPITAL_BASED_OUTPATIENT_CLINIC_OR_DEPARTMENT_OTHER): Payer: Self-pay | Admitting: *Deleted

## 2023-02-01 MED ORDER — SACUBITRIL-VALSARTAN 24-26 MG PO TABS: 1.0000 | ORAL_TABLET | Freq: Two times a day (BID) | ORAL | 3 refills | Status: DC

## 2023-02-01 NOTE — Telephone Encounter (Signed)
Received notification from [employer] needed updated Rx for [facility] faxed  Placed on Dr Leonides Sake desk and will fax when signed

## 2023-02-06 NOTE — Telephone Encounter (Signed)
Faxed, confirmation received

## 2023-03-16 ENCOUNTER — Telehealth (HOSPITAL_BASED_OUTPATIENT_CLINIC_OR_DEPARTMENT_OTHER): Payer: Self-pay | Admitting: Cardiovascular Disease

## 2023-03-16 ENCOUNTER — Ambulatory Visit (HOSPITAL_BASED_OUTPATIENT_CLINIC_OR_DEPARTMENT_OTHER): Payer: Medicare Other | Admitting: Family

## 2023-03-16 NOTE — Telephone Encounter (Signed)
Good morning, patient has dropped off paperwork for financial assistance.Put it in [place] box

## 2023-03-21 MED ORDER — SACUBITRIL-VALSARTAN 24-26 MG PO TABS: 1.0000 | ORAL_TABLET | Freq: Two times a day (BID) | ORAL | 3 refills | Status: DC

## 2023-03-21 MED ORDER — DAPAGLIFLOZIN PROPANEDIOL 10 MG PO TABS: 10.0000 mg | ORAL_TABLET | Freq: Every day | ORAL | 3 refills | Status: DC

## 2023-03-21 NOTE — Telephone Encounter (Signed)
Forms completed, waiting for Dr Duke Salvia to sign

## 2023-03-21 NOTE — Addendum Note (Signed)
Addended by: Regis Bill B on: 03/21/2023 03:44 PM   Modules accepted: Orders

## 2023-03-22 ENCOUNTER — Encounter (HOSPITAL_BASED_OUTPATIENT_CLINIC_OR_DEPARTMENT_OTHER): Payer: Self-pay | Admitting: Cardiovascular Disease

## 2023-04-11 ENCOUNTER — Ambulatory Visit (INDEPENDENT_AMBULATORY_CARE_PROVIDER_SITE_OTHER): Payer: Medicare Other

## 2023-04-11 DIAGNOSIS — I5042 Chronic combined systolic (congestive) and diastolic (congestive) heart failure: Secondary | ICD-10-CM

## 2023-04-11 DIAGNOSIS — I255 Ischemic cardiomyopathy: Secondary | ICD-10-CM | POA: Diagnosis not present

## 2023-04-11 LAB — CUP PACEART REMOTE DEVICE CHECK
Battery Remaining Longevity: 114 mo
Battery Remaining Percentage: 92 %
Battery Voltage: 3.04 V
Brady Statistic RV Percent Paced: 1 %
Date Time Interrogation Session: 20241120010353
HighPow Impedance: 93 Ohm
Implantable Lead Connection Status: 753985
Implantable Lead Implant Date: 20120621
Implantable Lead Location: 753860
Implantable Pulse Generator Implant Date: 20240219
Lead Channel Impedance Value: 1150 Ohm
Lead Channel Pacing Threshold Amplitude: 2 V
Lead Channel Pacing Threshold Pulse Width: 0.8 ms
Lead Channel Sensing Intrinsic Amplitude: 11.3 mV
Lead Channel Setting Pacing Amplitude: 3 V
Lead Channel Setting Pacing Pulse Width: 0.8 ms
Lead Channel Setting Sensing Sensitivity: 0.5 mV
Pulse Gen Serial Number: 211012935

## 2023-04-13 ENCOUNTER — Other Ambulatory Visit (HOSPITAL_BASED_OUTPATIENT_CLINIC_OR_DEPARTMENT_OTHER): Payer: Self-pay | Admitting: Cardiovascular Disease

## 2023-04-16 ENCOUNTER — Telehealth (HOSPITAL_BASED_OUTPATIENT_CLINIC_OR_DEPARTMENT_OTHER): Payer: Self-pay | Admitting: *Deleted

## 2023-04-16 ENCOUNTER — Ambulatory Visit (HOSPITAL_BASED_OUTPATIENT_CLINIC_OR_DEPARTMENT_OTHER): Payer: Medicare Other | Admitting: Family

## 2023-04-16 ENCOUNTER — Encounter (HOSPITAL_BASED_OUTPATIENT_CLINIC_OR_DEPARTMENT_OTHER): Payer: Self-pay | Admitting: Family

## 2023-04-16 VITALS — BP 110/78 | HR 70 | Ht 66.0 in | Wt 186.2 lb

## 2023-04-16 DIAGNOSIS — E785 Hyperlipidemia, unspecified: Secondary | ICD-10-CM | POA: Diagnosis not present

## 2023-04-16 DIAGNOSIS — I5042 Chronic combined systolic (congestive) and diastolic (congestive) heart failure: Secondary | ICD-10-CM | POA: Diagnosis not present

## 2023-04-16 DIAGNOSIS — Z95810 Presence of automatic (implantable) cardiac defibrillator: Secondary | ICD-10-CM

## 2023-04-16 DIAGNOSIS — I25118 Atherosclerotic heart disease of native coronary artery with other forms of angina pectoris: Secondary | ICD-10-CM | POA: Diagnosis not present

## 2023-04-16 MED ORDER — ENTRESTO 24-26 MG PO TABS: 1.0000 | ORAL_TABLET | Freq: Two times a day (BID) | ORAL | 0 refills | Status: AC

## 2023-04-16 NOTE — Patient Instructions (Signed)
Medication Instructions:  Your physician recommends that you continue on your current medications as directed. Please refer to the Current Medication list given to you today.  We have given you a 14 day Entresto sample supply.  *If you need a refill on your cardiac medications before your next appointment, please call your pharmacy*  Follow-Up: At [facility], you and your health needs are our priority.  As part of our continuing mission to provide you with exceptional heart care, we have created designated Provider Care Teams.  These Care Teams include your primary Cardiologist (physician) and Advanced Practice Providers (APPs -  Physician Assistants and Nurse Practitioners) who all work together to provide you with the care you need, when you need it.  We recommend signing up for the patient portal called "MyChart".  Sign up information is provided on this After Visit Summary.  MyChart is used to connect with patients for Virtual Visits (Telemedicine).  Patients are able to view lab/test results, encounter notes, upcoming appointments, etc.  Non-urgent messages can be sent to your provider as well.   To learn more about what you can do with MyChart, go to [email or web address].    Your next appointment:   6 month(s)  Provider:   Chilton Si, MD

## 2023-04-16 NOTE — Telephone Encounter (Signed)
Called to follow up on Entresto patient assistance application 1-patient needs to fill out on new application, used old one 2-need Extra help program denial   Spoke with wife, sent her link to new form As for extra help, waiting on denial letter Advised wife thought patient was approved through December of this year and try to get refill, verbalized understanding and she will let me know

## 2023-04-16 NOTE — Progress Notes (Unsigned)
Cardiology Office Note:  .   Date:  04/16/2023  ID:  Spencer Horn, DOB 02-17-1951, MRN [id number] PCP: Spencer Mask, MD  [facility] Providers Cardiologist:  Spencer Si, MD { Click to update primary MD,subspecialty MD or APP then REFRESH:1}   History of Present Illness: .   Spencer Horn is a 72 y.o. male with a history of CAD s/p MI 2004 and 06/2010 (DES-LAD), hypertension, chronic systolic and diastolic heart failure s/p ICD, ED, COPD, hyperlipidemia.  Previous patient of Dr. Patty Horn.  Snoring has been noted previously as well as fatigue but he has declined sleep study as but not wear CPAP.  Fatigue has previously been worked up with unremarkable blood counts, thyroid function, vitamin D, testosterone level.  Prior PFT showed severe COPD and emphysema.  Inhalers have been cost prohibitive.  Entresto switch back to losartan due to cost and no significant improvement in symptoms when on Entresto.  Repeat echo 12/2019 LVEF 15-20%, global hypokinesis with apical akinesis, LV moderately dilated, gr1dd, normal RV SF.  Myoview at the time LVEF 30% with prior apical infarct and no new ischemia.  He participate in cardiac rehab.  Repeat echo 05/2020 LVEF 50 Miss 20%.  Echo 02/2022 LVEF 35%, mild LVH.  Visit 03/2022 hyperkalemia noted he was recommended to stop spironolactone.  06/2022 underwent ICD generator change out.  Last in by Spencer Horn 10/05/2022 walking regularly for exercise feeling overall okay.  Relatively hypotensive in clinic but asymptomatic.  Presents today for follow up. *** [place] One week left  ROS: Please see the history of present illness.    All other systems reviewed and are negative.   Studies Reviewed: .        Cardiac Studies & Procedures     STRESS TESTS  MYOCARDIAL PERFUSION IMAGING 01/05/2020  Narrative  Nuclear stress EF: 30%.  There was no ST segment deviation noted during stress.  Defect 1: There is a medium defect of  severe severity present in the apex location.  Findings consistent with prior myocardial infarction.  This is a high risk study.  The left ventricular ejection fraction is moderately decreased (30-44%).  Abnormal, high risk stress nuclear study with prior apical infarct but no ischemia.  Gated ejection fraction was 30% with global hypokinesis worse at the apex.  Moderate left ventricular enlargement.   ECHOCARDIOGRAM  ECHOCARDIOGRAM COMPLETE 03/08/2022  Narrative ECHOCARDIOGRAM REPORT    Patient Name:   Spencer Horn  Date of Exam: 03/08/2022 Medical Rec #:  [id number]     Height:       66.0 in Accession #:    [id number]    Weight:       196.0 lb Date of Birth:  [birth year]     BSA:          1.983 m Patient Age:    71 years      BP:           126/82 mmHg Patient Gender: M             HR:           71 bpm. Exam Location:  [address]  Procedure: 2D Echo, Cardiac Doppler and Color Doppler  Indications:    I25.5 Ischemic cardiomyopathy  History:        Patient has prior history of Echocardiogram examinations, most recent 06/14/2020. CHF, Defibrillator, COPD, Ischemic cardiomyopathy; Risk Factors:Hypertension and Dyslipidemia.  Sonographer:    Samule Ohm RDCS Referring Phys:  [id number] Spencer Horn   Sonographer Comments: Technically difficult study due to poor echo windows. IMPRESSIONS   1. LV function is depressed with hypokinesis of the base/mid inferior wall, base mid lateral wall, basal septal wall; akinesis of the mid/distal septal, distal inferior and apical walls Only anterior wall moves fairly well. . Left ventricular ejection fraction, by estimation, is 35%. The left ventricle has moderately decreased function. There is mild left ventricular hypertrophy. 2. Right ventricular systolic function is normal. The right ventricular size is normal. 3. The mitral valve is normal in structure. Mild mitral valve regurgitation. 4. The aortic valve is tricuspid. Aortic valve  regurgitation is not visualized. 5. The inferior vena cava is normal in size with <50% respiratory variability, suggesting right atrial pressure of 8 mmHg.  FINDINGS Left Ventricle: LV function is depressed with hypokinesis of the base/mid inferior wall, base mid lateral wall, basal septal wall; akinesis of the mid/distal septal, distal inferior and apical walls Only anterior wall moves fairly well. Left ventricular ejection fraction, by estimation, is 35%. The left ventricle has moderately decreased function. The left ventricular internal cavity size was normal in size. There is mild left ventricular hypertrophy.  Right Ventricle: The right ventricular size is normal. Right vetricular wall thickness was not assessed. Right ventricular systolic function is normal.  Left Atrium: Left atrial size was normal in size.  Right Atrium: Right atrial size was normal in size.  Pericardium: There is no evidence of pericardial effusion.  Mitral Valve: The mitral valve is normal in structure. Mild mitral valve regurgitation.  Tricuspid Valve: The tricuspid valve is normal in structure. Tricuspid valve regurgitation is mild.  Aortic Valve: The aortic valve is tricuspid. Aortic valve regurgitation is not visualized.  Pulmonic Valve: The pulmonic valve was normal in structure. Pulmonic valve regurgitation is trivial.  Aorta: The aortic root and ascending aorta are structurally normal, with no evidence of dilitation.  Venous: The inferior vena cava is normal in size with less than 50% respiratory variability, suggesting right atrial pressure of 8 mmHg.  IAS/Shunts: No atrial level shunt detected by color flow Doppler.  Additional Comments: A device lead is visualized.   LEFT VENTRICLE PLAX 2D LVIDd:         4.60 cm   Diastology LVIDs:         3.85 cm   LV e' medial:    5.22 cm/s LV PW:         1.10 cm   LV E/e' medial:  12.2 LV IVS:        1.20 cm   LV e' lateral:   6.20 cm/s LVOT diam:     2.60  cm   LV E/e' lateral: 10.3 LV SV:         59 LV SV Index:   30 LVOT Area:     5.31 cm   RIGHT VENTRICLE             IVC RV S prime:     12.80 cm/s  IVC diam: 1.50 cm TAPSE (M-mode): 1.7 cm RVSP:           26.8 mmHg  LEFT ATRIUM             Index        RIGHT ATRIUM           Index LA Vol (A2C):   46.5 ml 23.45 ml/m  RA Pressure: 3.00 mmHg LA Vol (A4C):   24.1 ml 12.16 ml/m  RA Area:  12.40 cm LA Biplane Vol: 35.0 ml 17.65 ml/m  RA Volume:   29.60 ml  14.93 ml/m AORTIC VALVE LVOT Vmax:   61.50 cm/s LVOT Vmean:  38.200 cm/s LVOT VTI:    0.112 m  AORTA Ao Root diam: 3.30 cm Ao Asc diam:  3.40 cm  MITRAL VALVE               TRICUSPID VALVE MV Area (PHT): 2.68 cm    TR Peak grad:   23.8 mmHg MV Decel Time: 283 msec    TR Vmax:        244.00 cm/s MV E velocity: 63.90 cm/s  Estimated RAP:  3.00 mmHg MV A velocity: 82.80 cm/s  RVSP:           26.8 mmHg MV E/A ratio:  0.77 SHUNTS Systemic VTI:  0.11 m Systemic Diam: 2.60 cm  Dietrich Pates MD Electronically signed by Dietrich Pates MD Signature Date/Time: 03/08/2022/5:56:05 PM    Final             Risk Assessment/Calculations:             Physical Exam:   VS:  BP 110/78   Pulse 70   Ht 5\' 6"  (1.676 m)   Wt 186 lb 3.2 oz (84.5 kg) Comment: self report  SpO2 95%   BMI 30.05 kg/m    Wt Readings from Last 3 Encounters:  04/16/23 186 lb 3.2 oz (84.5 kg)  10/12/22 186 lb (84.4 kg)  10/05/22 185 lb 3.2 oz (84 kg)    GEN: Well nourished, well developed in no acute distress NECK: No JVD; No carotid bruits CARDIAC: ***RRR, no murmurs, rubs, gallops RESPIRATORY:  Clear to auscultation without rales, wheezing or rhonchi  ABDOMEN: Soft, non-tender, non-distended EXTREMITIES:  No edema; No deformity   ASSESSMENT AND PLAN: .    Chronic systolic and diastolic heart failure-LVEF 35%. Renal lactone previously discontinued due to hyperkalemia.  CAD/HLD, LDL goal less than 70-09/2022 LDL 49. GDMT aspirin, atorvastatin,  carvedilol.  ICD in situ-follows with Dr. Ladona Ridgel.       Dispo: follow up in 6 months  Signed, Alver Sorrow, NP

## 2023-04-25 ENCOUNTER — Telehealth (HOSPITAL_BASED_OUTPATIENT_CLINIC_OR_DEPARTMENT_OTHER): Payer: Self-pay | Admitting: Cardiovascular Disease

## 2023-04-25 NOTE — Telephone Encounter (Signed)
Patient stopped by to drop off [employer] assistance paperwork for Dr Duke Salvia.Placed in the doctor's box.

## 2023-05-02 NOTE — Telephone Encounter (Signed)
Faxed [employer] Sherryll Burger) patient assistance, confirmation received

## 2023-05-08 NOTE — Progress Notes (Signed)
Remote ICD transmission.   

## 2023-05-14 ENCOUNTER — Telehealth: Payer: Self-pay | Admitting: Cardiovascular Disease

## 2023-05-14 NOTE — Telephone Encounter (Signed)
Pt states wife states that he is needing a refill on   sacubitril-valsartan (ENTRESTO) 24-26 MG  That he gets from [facility]. Pt has only 2 tablets left. Please advise

## 2023-05-14 NOTE — Telephone Encounter (Signed)
Spoke with [facility]ife, aware samples will be at the front desk for pick up

## 2023-05-14 NOTE — Telephone Encounter (Addendum)
After 38 minutes on phone (most of the time holding) with [employer] verbal order for Entresto given to Stormy Card D  Prescription faxed was cancelled for some reason  Mychart message sent to patient

## 2023-05-15 MED ORDER — ENTRESTO 24-26 MG PO TABS: 1.0000 | ORAL_TABLET | Freq: Two times a day (BID) | ORAL | Status: AC

## 2023-05-15 NOTE — Addendum Note (Signed)
Addended by: Regis Bill B on: 05/15/2023 08:21 AM   Modules accepted: Orders

## 2023-05-15 NOTE — Addendum Note (Signed)
Addended by: Regis Bill B on: 05/15/2023 11:29 AM   Modules accepted: Orders

## 2023-06-04 ENCOUNTER — Telehealth (HOSPITAL_BASED_OUTPATIENT_CLINIC_OR_DEPARTMENT_OTHER): Payer: Self-pay | Admitting: *Deleted

## 2023-06-04 LAB — COLOGUARD: COLOGUARD: NEGATIVE

## 2023-06-04 LAB — EXTERNAL GENERIC LAB PROCEDURE: COLOGUARD: NEGATIVE

## 2023-06-04 MED ORDER — DAPAGLIFLOZIN PROPANEDIOL 10 MG PO TABS: 10.0000 mg | ORAL_TABLET | Freq: Every day | ORAL | 3 refills | Status: DC

## 2023-06-04 NOTE — Telephone Encounter (Signed)
 Received request for new Spencer Horn Rx to be sent to [employer] for patient assistance, sent as requested

## 2023-06-19 NOTE — Telephone Encounter (Signed)
Fax received Entresto approved through end of year Mychart message sent to patient

## 2023-07-11 ENCOUNTER — Ambulatory Visit (INDEPENDENT_AMBULATORY_CARE_PROVIDER_SITE_OTHER): Payer: Medicare Other

## 2023-07-11 DIAGNOSIS — I255 Ischemic cardiomyopathy: Secondary | ICD-10-CM | POA: Diagnosis not present

## 2023-07-12 ENCOUNTER — Encounter: Payer: Self-pay | Admitting: Internal Medicine

## 2023-07-12 LAB — CUP PACEART REMOTE DEVICE CHECK
Battery Remaining Longevity: 112 mo
Battery Remaining Percentage: 90 %
Battery Voltage: 3.02 V
Brady Statistic RV Percent Paced: 1 %
Date Time Interrogation Session: 20250219021358
HighPow Impedance: 93 Ohm
Implantable Lead Connection Status: 753985
Implantable Lead Implant Date: 20120621
Implantable Lead Location: 753860
Implantable Pulse Generator Implant Date: 20240219
Lead Channel Impedance Value: 1150 Ohm
Lead Channel Pacing Threshold Amplitude: 2 V
Lead Channel Pacing Threshold Pulse Width: 0.8 ms
Lead Channel Sensing Intrinsic Amplitude: 11.3 mV
Lead Channel Setting Pacing Amplitude: 3 V
Lead Channel Setting Pacing Pulse Width: 0.8 ms
Lead Channel Setting Sensing Sensitivity: 0.5 mV
Pulse Gen Serial Number: 211012935

## 2023-07-16 ENCOUNTER — Other Ambulatory Visit: Payer: Self-pay | Admitting: Cardiovascular Disease

## 2023-08-15 NOTE — Addendum Note (Signed)
 Addended by: Elease Etienne A on: 08/15/2023 11:52 AM   Modules accepted: Orders

## 2023-08-15 NOTE — Progress Notes (Signed)
 Remote ICD transmission.

## 2023-10-10 ENCOUNTER — Ambulatory Visit (INDEPENDENT_AMBULATORY_CARE_PROVIDER_SITE_OTHER): Payer: Medicare Other

## 2023-10-10 DIAGNOSIS — I255 Ischemic cardiomyopathy: Secondary | ICD-10-CM

## 2023-10-10 LAB — CUP PACEART REMOTE DEVICE CHECK
Battery Remaining Longevity: 110 mo
Battery Remaining Percentage: 89 %
Battery Voltage: 3.02 V
Brady Statistic RV Percent Paced: 1 %
Date Time Interrogation Session: 20250521031326
HighPow Impedance: 96 Ohm
Implantable Lead Connection Status: 753985
Implantable Lead Implant Date: 20120621
Implantable Lead Location: 753860
Implantable Pulse Generator Implant Date: 20240219
Lead Channel Impedance Value: 1175 Ohm
Lead Channel Pacing Threshold Amplitude: 2 V
Lead Channel Pacing Threshold Pulse Width: 0.8 ms
Lead Channel Sensing Intrinsic Amplitude: 11.3 mV
Lead Channel Setting Pacing Amplitude: 3 V
Lead Channel Setting Pacing Pulse Width: 0.8 ms
Lead Channel Setting Sensing Sensitivity: 0.5 mV
Pulse Gen Serial Number: 211012935

## 2023-10-14 ENCOUNTER — Ambulatory Visit: Payer: Self-pay | Admitting: Internal Medicine

## 2023-10-30 ENCOUNTER — Ambulatory Visit (HOSPITAL_BASED_OUTPATIENT_CLINIC_OR_DEPARTMENT_OTHER): Payer: Medicare Other | Admitting: Cardiovascular Disease

## 2023-11-06 ENCOUNTER — Ambulatory Visit (HOSPITAL_BASED_OUTPATIENT_CLINIC_OR_DEPARTMENT_OTHER): Payer: Medicare Other | Admitting: Cardiovascular Disease

## 2023-11-06 ENCOUNTER — Encounter (HOSPITAL_BASED_OUTPATIENT_CLINIC_OR_DEPARTMENT_OTHER): Payer: Self-pay | Admitting: Cardiovascular Disease

## 2023-11-06 VITALS — BP 118/76 | HR 89 | Ht 66.0 in | Wt 200.7 lb

## 2023-11-06 DIAGNOSIS — E785 Hyperlipidemia, unspecified: Secondary | ICD-10-CM

## 2023-11-06 DIAGNOSIS — I10 Essential (primary) hypertension: Secondary | ICD-10-CM | POA: Diagnosis not present

## 2023-11-06 DIAGNOSIS — I5042 Chronic combined systolic (congestive) and diastolic (congestive) heart failure: Secondary | ICD-10-CM | POA: Diagnosis not present

## 2023-11-06 DIAGNOSIS — I255 Ischemic cardiomyopathy: Secondary | ICD-10-CM

## 2023-11-06 NOTE — Progress Notes (Signed)
 Cardiology Office Note:  .   Date:  11/06/2023  ID:  Spencer Horn, DOB 1950/07/05, MRN [id number] PCP: Candiss Chamorro, MD  [facility] Providers Cardiologist:  Maudine Sos, MD    History of Present Illness: .    Spencer Horn is a 73 y.o. male with CAD s/p MI, hypertension, chronic systolic and diastolic heart failure s/p ICD, erectile dysfunction, COPD, and hyperlipidemia who presents for follow up.  Spencer Horn was previously a patient of Dr. Santiago Cuff.  He had an MI in 2004 and 06/2010.  The second MI was treated with DES to the LAD.  He then had LVEF 35% and subsequently underwent implantation of an ICD.  He continues to complain of fatigue.  Blood counts, thyroid  function, vitamin D  and testosterone  levels were all within normal limits.  Spencer Horn has struggled with exertional dyspnea.  His wife notes that he snores loudly but he has been persistently unwilling to get a sleep study.  He reports that he would not wear CPAP and therefore does not want to waste money or time on a test.  He was referred for PFTs that showed severe COPD and emphysema.  Entresto  was switched back to losartan  due to cost and the fact that his symptoms hadn't improved.  He was referred to Dr. Bertrum Horn and was started on Spiriva .  The Spiriva  was not helpful so he stopped using it.  He does use the albuterol  rescue inhaler as needed.  He has been unable to afford most inhalers.  He reports that they make just a little too much to qualify for patient assistance programs.    Spencer Horn complained of increasing fatigue and exertional dyspnea.  He was referred for an echo 12/2019 that revealed LVEF 15 to 20%.  There is global hypokinesis with apical akinesis.  LV was moderately dilated.  He had grade 1 diastolic dysfunction and normal right ventricular function.  There is no significant valvular abnormality.  He had a Lexiscan  Myoview  at the same time that revealed LVEF 30% with prior apical infarct and no ischemia.  He  participated in cardiac rehab. He had an Echo 05/2020 that revealed LVEF 15-20%. He saw Lawana Pray, NP on 06/2020 and was doing very well.  He sees Dr. Carolynne Horn for his ICD which had been stable.   Previously we recommended getting an Echo but he was feeling well and declined. He reported some atypical chest pain but none with exertion. He reported anxiety and I recommended PCP follow up. He saw Dr Carolynne Horn and completed an Echo 02/2022 with LVEF 35% and mild LDH.  At his visit 03/2022 he was euvolemic on exam and doing well. His blood pressure was low in the office but he was asymptomatic. Labs at that time were notable for K+ of 5.9; it was recommended to stop spironolactone . His potassium levels normalized and he remained off spironolactone . He was seen by Dr. Carolynne Horn 05/2022 and subsequently underwent successful ICD generator change-out the following month.  He was doing well at his visit 09/2022.  He saw Spencer Banks, NP 03/2023 and noted increased exertioanl dyspnea.     Discussed the use of AI scribe software for clinical note transcription with the patient, who gave verbal consent to proceed.  History of Present Illness Spencer Horn has been experiencing a rash on one finger, described as 'little round circles, bumps', intermittently over the past couple of months. The rash is neither itchy nor painful. No fevers, chills, or night  sweats. He initially suspected a spider bite but is now uncertain about the cause. He is concerned about the potential impact on his heart health.  He has a history of a pinched nerve in his lower back and knee issues, which necessitate the use of a walker at home. Despite these challenges, he remains active, engaging in household chores and yard work.  Regarding his cardiovascular health, he is on medications including aspirin, Plavix, atorvastatin , carvedilol , Entresto , and Farxiga . He has not been monitoring his blood pressure at home recently. His weight has been stable, with  slight fluctuations based on dietary intake, particularly sweets.  He hasn't been exercising other than caring for his wife.  He has no exertional chest pain or shortness of breath.  He has no LE edema, orthopnea or PND.   ROS:  As per HPI  Studies Reviewed: Spencer Horn   EKG Interpretation Date/Time:  Tuesday November 06 2023 09:11:30 EDT Ventricular Rate:  89 PR Interval:  184 QRS Duration:  102 QT Interval:  334 QTC Calculation: 406 R Axis:   -50  Text Interpretation: Normal sinus rhythm Left axis deviation Incomplete right bundle branch block Cannot rule out Anteroseptal infarct , age undetermined When compared with ECG of 26-Jan-2016 23:11, PREVIOUS ECG IS PRESENT Confirmed by Maudine Sos ([id number]) on 11/06/2023 9:30:55 AM     Risk Assessment/Calculations:             Physical Exam:   VS:  BP 118/76   Pulse 89   Ht 5' 6 (1.676 m)   Wt 200 lb 11.2 oz (91 kg)   SpO2 93%   BMI 32.39 kg/m  , BMI Body mass index is 32.39 kg/m. GENERAL:  Well appearing HEENT: Pupils equal round and reactive, fundi not visualized, oral mucosa unremarkable NECK:  No jugular venous distention, waveform within normal limits, carotid upstroke brisk and symmetric, no bruits, no thyromegaly LUNGS:  Clear to auscultation bilaterally HEART:  RRR.  PMI not displaced or sustained,S1 and S2 within normal limits, no S3, no S4, no clicks, no rubs, no murmurs ABD:  Flat, positive bowel sounds normal in frequency in pitch, no bruits, no rebound, no guarding, no midline pulsatile mass, no hepatomegaly, no splenomegaly EXT:  2 plus pulses throughout, no edema, no cyanosis no clubbing SKIN:  No rashes no nodules NEURO:  Cranial nerves II through XII grossly intact, motor grossly intact throughout PSYCH:  Cognitively intact, oriented to person place and time   ASSESSMENT AND PLAN: .    Assessment & Plan # HFrEF:  # Hypertension:  Last recorded LVEF 30-35%.  Heart failure well-managed with improved cardiac function  on last echocardiogram, which was two years ago. Current medications effectively managing condition. - Continue carvedilol , Entresto , and Farxiga . - Order repeat echocardiogram to assess LVEF.  # CAD s/p MI: #b Hyperlipidemia:  He is doing well with no ischemic symptoms.  Continue aspirin and statin.  Lipids are well-controlled.  - Check cholesterol levels.  # Reactive lymph node Reactive lymph node likely due to localized skin reaction or inflammation. No systemic symptoms present. - Apply hydrocortisone to affected area for a few days. - Monitor for fever, chills, or increase in size of lymph node. Seek urgent care if symptoms develop.       F/u in 1 year     Signed, Maudine Sos, MD

## 2023-11-06 NOTE — Patient Instructions (Signed)
 Medication Instructions:  Your physician recommends that you continue on your current medications as directed. Please refer to the Current Medication list given to you today.  *If you need a refill on your cardiac medications before your next appointment, please call your pharmacy*  Lab Work: FASTING LP/CMET SOON   If you have labs (blood work) drawn today and your tests are completely normal, you will receive your results only by: MyChart Message (if you have MyChart) OR A paper copy in the mail If you have any lab test that is abnormal or we need to change your treatment, we will call you to review the results.  Testing/Procedures: Your physician has requested that you have an echocardiogram. Echocardiography is a painless test that uses sound waves to create images of your heart. It provides your doctor with information about the size and shape of your heart and how well your heart's chambers and valves are working. This procedure takes approximately one hour. There are no restrictions for this procedure. Please do NOT wear cologne, perfume, aftershave, or lotions (deodorant is allowed). Please arrive 15 minutes prior to your appointment time.  Please note: We ask at that you not bring children with you during ultrasound (echo/ vascular) testing. Due to room size and safety concerns, children are not allowed in the ultrasound rooms during exams. Our front office staff cannot provide observation of children in our lobby area while testing is being conducted. An adult accompanying a patient to their appointment will only be allowed in the ultrasound room at the discretion of the ultrasound technician under special circumstances. We apologize for any inconvenience.   Follow-Up: At [facility], you and your health needs are our priority.  As part of our continuing mission to provide you with exceptional heart care, our providers are all part of one team.  This team includes your primary  Cardiologist (physician) and Advanced Practice Providers or APPs (Physician Assistants and Nurse Practitioners) who all work together to provide you with the care you need, when you need it.  Your next appointment:   12 month(s)  Provider:   Chilton Si, MD, Eligha Bridegroom, NP, or Gillian Shields, NP   We recommend signing up for the patient portal called "MyChart".  Sign up information is provided on this After Visit Summary.  MyChart is used to connect with patients for Virtual Visits (Telemedicine).  Patients are able to view lab/test results, encounter notes, upcoming appointments, etc.  Non-urgent messages can be sent to your provider as well.   To learn more about what you can do with MyChart, go to [email or web address].

## 2023-11-09 ENCOUNTER — Ambulatory Visit: Payer: Self-pay | Admitting: Cardiovascular Disease

## 2023-11-09 DIAGNOSIS — I255 Ischemic cardiomyopathy: Secondary | ICD-10-CM

## 2023-11-09 DIAGNOSIS — I10 Essential (primary) hypertension: Secondary | ICD-10-CM

## 2023-11-09 DIAGNOSIS — I7789 Other specified disorders of arteries and arterioles: Secondary | ICD-10-CM

## 2023-11-09 LAB — COMPREHENSIVE METABOLIC PANEL WITH GFR
ALT: 14 IU/L (ref 0–44)
AST: 21 IU/L (ref 0–40)
Albumin: 3.9 g/dL (ref 3.8–4.8)
Alkaline Phosphatase: 85 IU/L (ref 44–121)
BUN/Creatinine Ratio: 21 (ref 10–24)
BUN: 19 mg/dL (ref 8–27)
Bilirubin Total: 0.7 mg/dL (ref 0.0–1.2)
CO2: 22 mmol/L (ref 20–29)
Calcium: 9.2 mg/dL (ref 8.6–10.2)
Chloride: 100 mmol/L (ref 96–106)
Creatinine, Ser: 0.9 mg/dL (ref 0.76–1.27)
Globulin, Total: 2.8 g/dL (ref 1.5–4.5)
Glucose: 102 mg/dL — ABNORMAL HIGH (ref 70–99)
Potassium: 4.7 mmol/L (ref 3.5–5.2)
Sodium: 138 mmol/L (ref 134–144)
Total Protein: 6.7 g/dL (ref 6.0–8.5)
eGFR: 91 mL/min/{1.73_m2} (ref >59)

## 2023-11-09 LAB — LIPID PANEL
Chol/HDL Ratio: 3.1 ratio (ref 0.0–5.0)
Cholesterol, Total: 105 mg/dL (ref 100–199)
HDL: 34 mg/dL — ABNORMAL LOW (ref >39)
LDL Chol Calc (NIH): 46 mg/dL (ref 0–99)
Triglycerides: 144 mg/dL (ref 0–149)
VLDL Cholesterol Cal: 25 mg/dL (ref 5–40)

## 2023-11-27 ENCOUNTER — Other Ambulatory Visit: Payer: Self-pay | Admitting: Cardiovascular Disease

## 2023-11-29 NOTE — Progress Notes (Signed)
 Remote ICD transmission.

## 2023-12-10 ENCOUNTER — Telehealth: Payer: Self-pay | Admitting: Pharmacy Technician

## 2023-12-10 MED ORDER — DAPAGLIFLOZIN PROPANEDIOL 10 MG PO TABS: 10.0000 mg | ORAL_TABLET | Freq: Every day | ORAL | 3 refills | Status: AC

## 2023-12-10 NOTE — Telephone Encounter (Signed)
 SABRA

## 2023-12-10 NOTE — Telephone Encounter (Signed)
 Hi, scanned in media under FAR[employer]IGA  [employer] PROVIDER BLANK FORM  is the form for this assistance application. He needs a refill for his already approved application. Can someone please get the provider to sign and fax back to [phone]? Thank you!

## 2023-12-10 NOTE — Telephone Encounter (Signed)
 Farxiga  sent via Epic as requested

## 2023-12-20 ENCOUNTER — Ambulatory Visit (HOSPITAL_COMMUNITY)
Admission: RE | Admit: 2023-12-20 | Discharge: 2023-12-20 | Disposition: A | Discharge status: Home / Self Care | Source: Ambulatory Visit | Attending: Cardiology | Admitting: Cardiology

## 2023-12-20 DIAGNOSIS — I5042 Chronic combined systolic (congestive) and diastolic (congestive) heart failure: Secondary | ICD-10-CM | POA: Diagnosis not present

## 2023-12-20 DIAGNOSIS — I255 Ischemic cardiomyopathy: Secondary | ICD-10-CM | POA: Insufficient documentation

## 2023-12-20 LAB — ECHOCARDIOGRAM COMPLETE
Area-P 1/2: 2.74 cm2
S' Lateral: 4.4 cm

## 2023-12-27 ENCOUNTER — Ambulatory Visit: Attending: Internal Medicine | Admitting: Internal Medicine

## 2023-12-27 ENCOUNTER — Encounter: Payer: Self-pay | Admitting: Internal Medicine

## 2023-12-27 VITALS — BP 124/72 | HR 86 | Ht 66.0 in | Wt 186.2 lb

## 2023-12-27 DIAGNOSIS — I5022 Chronic systolic (congestive) heart failure: Secondary | ICD-10-CM

## 2023-12-27 NOTE — Patient Instructions (Signed)
 Medication Instructions:  Your physician recommends that you continue on your current medications as directed. Please refer to the Current Medication list given to you today.  *If you need a refill on your cardiac medications before your next appointment, please call your pharmacy*  Lab Work: None ordered  Testing/Procedures: None ordered  Follow-Up: At [facility], you and your health needs are our priority.  As part of our continuing mission to provide you with exceptional heart care, our providers are all part of one team.  This team includes your primary Cardiologist (physician) and Advanced Practice Providers or APPs (Physician Assistants and Nurse Practitioners) who all work together to provide you with the care you need, when you need it.  Your next appointment:   1 year(s)  Provider:   Dr. [place]    Thank you for choosing [facility]!!   [phone]

## 2023-12-27 NOTE — Progress Notes (Signed)
 HPI [name] returns today for followup. He is a pleasant 73 yo man with a h/o chronic systolic heart failure, EF 25-30%, HTN and obesity s/p ICD insertion. He has undergone cardiac rehab and lost weight and feels better. Since his last visit, no change. He had Covid and his EF decreased. His repeat echo several days ago demonstrates an EF of 25-30%.  He has class 2 symptoms.     Allergies  Allergen Reactions   Spironolactone      Elevated potassium      Current Outpatient Medications  Medication Sig Dispense Refill   aspirin 81 MG tablet Take 81 mg by mouth daily.     atorvastatin  (LIPITOR) 40 MG tablet Take 1 tablet by mouth once daily 90 tablet 3   carvedilol  (COREG ) 3.125 MG tablet Take 1 tablet by mouth twice daily 180 tablet 2   dapagliflozin  propanediol (FARXIGA ) 10 MG TABS tablet Take 1 tablet (10 mg total) by mouth daily before breakfast. 90 tablet 3   fluticasone  (FLONASE ) 50 MCG/ACT nasal spray Place 2 sprays into both nostrils daily. 16 g 6   Multiple Vitamin (MULTIVITAMIN) tablet Take 1 tablet by mouth daily.     nitroGLYCERIN  (NITROSTAT ) 0.4 MG SL tablet DISSOLVE ONE TABLET UNDER THE TONGUE EVERY 5 MINUTES AS NEEDED FOR CHEST PAIN.  DO NOT EXCEED A TOTAL OF 3 DOSES IN 15 MINUTES 25 tablet 5   nystatin cream (MYCOSTATIN) Apply 1 Application topically as needed (heat rash).     ondansetron  (ZOFRAN  ODT) 4 MG disintegrating tablet Take 1 tablet (4 mg total) by mouth every 8 (eight) hours as needed for nausea or vomiting. 3 tablet 0   sacubitril -valsartan  (ENTRESTO ) 24-26 MG Take 1 tablet by mouth 2 (two) times daily. 180 tablet 3   No current facility-administered medications for this visit.     Past Medical History:  Diagnosis Date   Acute on chronic combined systolic and diastolic CHF (congestive heart failure) (HCC) 01/27/2020   Chronic combined systolic and diastolic heart failure (HCC) 01/27/2020   Coronary artery disease    Diabetes mellitus type 2 in obese  09/21/2020   Last hemoglobin A1c was 7.0%.  He has made significant diet and exercise changes since that time.  We will start Farxiga  10 mg daily for his heart failure.  When he gets repeat labs in a few months we will also repeat his hemoglobin A1c.   Dyslipidemia    Erectile dysfunction 08/18/2016   Fatigue 12/18/2019   History of acute anterior wall MI    Hyperlipidemia    ICD (implantable cardiac defibrillator) in place June 2012   Ischemic cardiomyopathy    Personal history of MRSA (methicillin resistant Staphylococcus aureus)    Tobacco abuse    Ventricular dysfunction 07/07/2010   Severe left ventricular dysfunction with ejection fraction of 30-35%    ROS:   All systems reviewed and negative except as noted in the HPI.   Past Surgical History:  Procedure Laterality Date   CARDIAC CATHETERIZATION  07/07/2010   DES to proximal LAD   CARDIAC CATHETERIZATION  2004   EP IMPLANTABLE DEVICE  June 2012   ICD GENERATOR CHANGEOUT N/A 07/10/2022   Procedure: ICD GENERATOR CHANGEOUT;  Surgeon: Waddell Danelle ORN, MD;  Location: [facility] INVASIVE CV LAB;  Service: Cardiovascular;  Laterality: N/A;     Family History  Problem Relation Age of Onset   Hypertension Mother    Bone cancer Mother    Hyperlipidemia Father  Bladder Cancer Father    Diabetes Father      Social History   Socioeconomic History   Marital status: Married    Spouse name: Not on file   Number of children: Not on file   Years of education: Not on file   Highest education level: Not on file  Occupational History   Not on file  Tobacco Use   Smoking status: Former    Current packs/day: 0.00    Average packs/day: 1 pack/day for 40.0 years (40.0 ttl pk-yrs)    Types: Cigarettes    Start date: 29    Quit date: 2012    Years since quitting: 13.6   Smokeless tobacco: Never  Vaping Use   Vaping status: Never Used  Substance and Sexual Activity   Alcohol use: No   Drug use: No   Sexual activity: Yes  Other  Topics Concern   Not on file  Social History Narrative   Not on file   Social Drivers of Health   Financial Resource Strain: Not on file  Food Insecurity: Not on file  Transportation Needs: Not on file  Physical Activity: Not on file  Stress: Not on file  Social Connections: Not on file  Intimate Partner Violence: Not on file     BP 124/72   Pulse 86   Ht 5' 6 (1.676 m)   Wt 186 lb 3.2 oz (84.5 kg)   SpO2 97%   BMI 30.05 kg/m   Physical Exam:  Well appearing NAD HEENT: Unremarkable Neck:  No JVD, no thyromegally Lymphatics:  No adenopathy Back:  No CVA tenderness Lungs:  Clear HEART:  Regular rate rhythm, no murmurs, no rubs, no clicks Abd:  soft, positive bowel sounds, no organomegally, no rebound, no guarding Ext:  2 plus pulses, no edema, no cyanosis, no clubbing Skin:  No rashes no nodules Neuro:  CN II through XII intact, motor grossly intact    DEVICE  Normal device function.  See PaceArt for details.   Assess/Plan:   Chronic systolic heart failure - his symptoms are class 2. He will continue his current meds. 2. ICD - his [employer] ICD is working normally. We will recheck in several months.  3. Obesity - he has lost 4 more lbs. He is encouraged to lose more weight. 4. CAD -  He is s/p MI, he denies anginal symptoms.      Danelle Zacari Radick,MD

## 2024-01-09 ENCOUNTER — Ambulatory Visit (INDEPENDENT_AMBULATORY_CARE_PROVIDER_SITE_OTHER): Payer: Medicare Other

## 2024-01-09 DIAGNOSIS — I255 Ischemic cardiomyopathy: Secondary | ICD-10-CM | POA: Diagnosis not present

## 2024-01-10 LAB — CUP PACEART REMOTE DEVICE CHECK
Battery Remaining Longevity: 106 mo
Battery Remaining Percentage: 86 %
Battery Voltage: 3.02 V
Brady Statistic RV Percent Paced: 0 %
Date Time Interrogation Session: 20250820020456
HighPow Impedance: 96 Ohm
Implantable Lead Connection Status: 753985
Implantable Lead Implant Date: 20120621
Implantable Lead Location: 753860
Implantable Pulse Generator Implant Date: 20240219
Lead Channel Impedance Value: 1175 Ohm
Lead Channel Pacing Threshold Amplitude: 1.75 V
Lead Channel Pacing Threshold Pulse Width: 0.8 ms
Lead Channel Sensing Intrinsic Amplitude: 11.3 mV
Lead Channel Setting Pacing Amplitude: 3 V
Lead Channel Setting Pacing Pulse Width: 0.8 ms
Lead Channel Setting Sensing Sensitivity: 0.5 mV
Pulse Gen Serial Number: 211012935

## 2024-01-13 ENCOUNTER — Ambulatory Visit: Payer: Self-pay | Admitting: Internal Medicine

## 2024-02-13 NOTE — Progress Notes (Signed)
Remote ICD Transmission.

## 2024-02-22 ENCOUNTER — Other Ambulatory Visit: Payer: Self-pay | Admitting: Cardiovascular Disease

## 2024-02-28 ENCOUNTER — Other Ambulatory Visit: Payer: Self-pay

## 2024-03-03 MED ORDER — SACUBITRIL-VALSARTAN 24-26 MG PO TABS: 1.0000 | ORAL_TABLET | Freq: Two times a day (BID) | ORAL | 2 refills | Status: DC

## 2024-03-19 ENCOUNTER — Encounter (HOSPITAL_BASED_OUTPATIENT_CLINIC_OR_DEPARTMENT_OTHER): Payer: Self-pay | Admitting: Cardiovascular Disease

## 2024-03-20 MED ORDER — SACUBITRIL-VALSARTAN 24-26 MG PO TABS: 1.0000 | ORAL_TABLET | Freq: Two times a day (BID) | ORAL | 1 refills | Status: DC

## 2024-03-25 MED ORDER — SACUBITRIL-VALSARTAN 24-26 MG PO TABS: 1.0000 | ORAL_TABLET | Freq: Two times a day (BID) | ORAL | 1 refills | Status: AC

## 2024-04-09 ENCOUNTER — Ambulatory Visit: Payer: Medicare Other

## 2024-04-09 DIAGNOSIS — I255 Ischemic cardiomyopathy: Secondary | ICD-10-CM | POA: Diagnosis not present

## 2024-04-10 ENCOUNTER — Ambulatory Visit: Payer: Self-pay | Admitting: Internal Medicine

## 2024-04-10 LAB — CUP PACEART REMOTE DEVICE CHECK
Battery Remaining Longevity: 104 mo
Battery Remaining Percentage: 85 %
Battery Voltage: 3.01 V
Brady Statistic RV Percent Paced: 1 %
Date Time Interrogation Session: 20251119020613
HighPow Impedance: 92 Ohm
Implantable Lead Connection Status: 753985
Implantable Lead Implant Date: 20120621
Implantable Lead Location: 753860
Implantable Pulse Generator Implant Date: 20240219
Lead Channel Impedance Value: 1100 Ohm
Lead Channel Pacing Threshold Amplitude: 1.75 V
Lead Channel Pacing Threshold Pulse Width: 0.8 ms
Lead Channel Sensing Intrinsic Amplitude: 11.3 mV
Lead Channel Setting Pacing Amplitude: 3 V
Lead Channel Setting Pacing Pulse Width: 0.8 ms
Lead Channel Setting Sensing Sensitivity: 0.5 mV
Pulse Gen Serial Number: 211012935

## 2024-04-11 NOTE — Progress Notes (Signed)
 Remote ICD Transmission

## 2024-04-23 ENCOUNTER — Encounter (HOSPITAL_BASED_OUTPATIENT_CLINIC_OR_DEPARTMENT_OTHER): Payer: Self-pay | Admitting: Cardiovascular Disease

## 2024-06-24 ENCOUNTER — Ambulatory Visit (HOSPITAL_COMMUNITY)

## 2024-06-24 ENCOUNTER — Other Ambulatory Visit (HOSPITAL_BASED_OUTPATIENT_CLINIC_OR_DEPARTMENT_OTHER)

## 2024-08-01 ENCOUNTER — Ambulatory Visit (HOSPITAL_COMMUNITY)
